# Patient Record
Sex: Female | Born: 1950 | Race: White | Hispanic: No | Smoking: Former smoker
Health system: Southern US, Community
[De-identification: ages and names within clinical notes are randomized; demographics above are authoritative.]

## PROBLEM LIST (undated history)

## (undated) DIAGNOSIS — F341 Dysthymic disorder: Secondary | ICD-10-CM

## (undated) DIAGNOSIS — K449 Diaphragmatic hernia without obstruction or gangrene: Secondary | ICD-10-CM

## (undated) DIAGNOSIS — M7750 Other enthesopathy of unspecified foot: Secondary | ICD-10-CM

## (undated) DIAGNOSIS — Z1509 Genetic susceptibility to other malignant neoplasm: Secondary | ICD-10-CM

## (undated) DIAGNOSIS — Z8041 Family history of malignant neoplasm of ovary: Secondary | ICD-10-CM

## (undated) DIAGNOSIS — Z85820 Personal history of malignant melanoma of skin: Secondary | ICD-10-CM

## (undated) DIAGNOSIS — F1120 Opioid dependence, uncomplicated: Secondary | ICD-10-CM

## (undated) DIAGNOSIS — I499 Cardiac arrhythmia, unspecified: Secondary | ICD-10-CM

## (undated) DIAGNOSIS — M216X9 Other acquired deformities of unspecified foot: Secondary | ICD-10-CM

## (undated) DIAGNOSIS — M722 Plantar fascial fibromatosis: Secondary | ICD-10-CM

## (undated) DIAGNOSIS — Z85828 Personal history of other malignant neoplasm of skin: Secondary | ICD-10-CM

## (undated) DIAGNOSIS — C4490 Unspecified malignant neoplasm of skin, unspecified: Secondary | ICD-10-CM

## (undated) DIAGNOSIS — K635 Polyp of colon: Secondary | ICD-10-CM

## (undated) DIAGNOSIS — Z801 Family history of malignant neoplasm of trachea, bronchus and lung: Secondary | ICD-10-CM

## (undated) DIAGNOSIS — F419 Anxiety disorder, unspecified: Secondary | ICD-10-CM

## (undated) DIAGNOSIS — R55 Syncope and collapse: Secondary | ICD-10-CM

## (undated) DIAGNOSIS — M1990 Unspecified osteoarthritis, unspecified site: Secondary | ICD-10-CM

## (undated) DIAGNOSIS — Z1501 Genetic susceptibility to malignant neoplasm of breast: Secondary | ICD-10-CM

## (undated) DIAGNOSIS — Z86010 Personal history of colonic polyps: Secondary | ICD-10-CM

## (undated) DIAGNOSIS — K589 Irritable bowel syndrome without diarrhea: Secondary | ICD-10-CM

## (undated) DIAGNOSIS — I4892 Unspecified atrial flutter: Secondary | ICD-10-CM

## (undated) DIAGNOSIS — H269 Unspecified cataract: Secondary | ICD-10-CM

## (undated) DIAGNOSIS — M21619 Bunion of unspecified foot: Secondary | ICD-10-CM

## (undated) DIAGNOSIS — Z800 Family history of malignant neoplasm of digestive organs: Secondary | ICD-10-CM

## (undated) DIAGNOSIS — Z923 Personal history of irradiation: Secondary | ICD-10-CM

## (undated) DIAGNOSIS — Z803 Family history of malignant neoplasm of breast: Secondary | ICD-10-CM

## (undated) DIAGNOSIS — K219 Gastro-esophageal reflux disease without esophagitis: Secondary | ICD-10-CM

## (undated) HISTORY — DX: Bunion of unspecified foot: M21.619

## (undated) HISTORY — DX: Opioid dependence, uncomplicated: F11.20

## (undated) HISTORY — PX: POLYPECTOMY: SHX149

## (undated) HISTORY — DX: Unspecified osteoarthritis, unspecified site: M19.90

## (undated) HISTORY — DX: Syncope and collapse: R55

## (undated) HISTORY — DX: Personal history of colonic polyps: Z86.010

## (undated) HISTORY — DX: Anxiety disorder, unspecified: F41.9

## (undated) HISTORY — DX: Unspecified atrial flutter: I48.92

## (undated) HISTORY — DX: Family history of malignant neoplasm of digestive organs: Z80.0

## (undated) HISTORY — DX: Plantar fascial fibromatosis: M72.2

## (undated) HISTORY — DX: Irritable bowel syndrome, unspecified: K58.9

## (undated) HISTORY — DX: Polyp of colon: K63.5

## (undated) HISTORY — DX: Other enthesopathy of unspecified foot and ankle: M77.50

## (undated) HISTORY — DX: Other acquired deformities of unspecified foot: M21.6X9

## (undated) HISTORY — PX: PLACEMENT OF BREAST IMPLANTS: SHX6334

## (undated) HISTORY — DX: Gastro-esophageal reflux disease without esophagitis: K21.9

## (undated) HISTORY — DX: Family history of malignant neoplasm of ovary: Z80.41

## (undated) HISTORY — DX: Personal history of malignant melanoma of skin: Z85.820

## (undated) HISTORY — DX: Dysthymic disorder: F34.1

## (undated) HISTORY — DX: Family history of malignant neoplasm of breast: Z80.3

## (undated) HISTORY — PX: BUNIONECTOMY: SHX129

## (undated) HISTORY — DX: Personal history of other malignant neoplasm of skin: Z85.828

## (undated) HISTORY — PX: AUGMENTATION MAMMAPLASTY: SUR837

## (undated) HISTORY — DX: Unspecified malignant neoplasm of skin, unspecified: C44.90

## (undated) HISTORY — DX: Genetic susceptibility to malignant neoplasm of breast: Z15.01

## (undated) HISTORY — PX: SHOULDER SURGERY: SHX246

## (undated) HISTORY — PX: PARTIAL HYSTERECTOMY: SHX80

## (undated) HISTORY — DX: Family history of malignant neoplasm of trachea, bronchus and lung: Z80.1

## (undated) HISTORY — DX: Genetic susceptibility to malignant neoplasm of breast: Z15.09

## (undated) HISTORY — DX: Unspecified cataract: H26.9

---

## 1998-02-21 ENCOUNTER — Other Ambulatory Visit: Admission: RE | Admit: 1998-02-21 | Discharge: 1998-02-21 | Payer: Self-pay | Admitting: Obstetrics and Gynecology

## 1999-01-28 ENCOUNTER — Emergency Department (HOSPITAL_COMMUNITY): Admission: EM | Admit: 1999-01-28 | Discharge: 1999-01-28 | Payer: Self-pay | Admitting: Emergency Medicine

## 1999-02-03 ENCOUNTER — Encounter: Admission: RE | Admit: 1999-02-03 | Discharge: 1999-02-03 | Payer: Self-pay | Admitting: Sports Medicine

## 1999-02-06 ENCOUNTER — Encounter: Admission: RE | Admit: 1999-02-06 | Discharge: 1999-02-06 | Payer: Self-pay | Admitting: Sports Medicine

## 1999-02-13 ENCOUNTER — Ambulatory Visit (HOSPITAL_COMMUNITY): Admission: RE | Admit: 1999-02-13 | Discharge: 1999-02-13 | Payer: Self-pay | Admitting: Sports Medicine

## 1999-03-15 ENCOUNTER — Other Ambulatory Visit: Admission: RE | Admit: 1999-03-15 | Discharge: 1999-03-15 | Payer: Self-pay | Admitting: Obstetrics and Gynecology

## 2000-02-06 ENCOUNTER — Ambulatory Visit (HOSPITAL_COMMUNITY): Admission: RE | Admit: 2000-02-06 | Discharge: 2000-02-06 | Payer: Self-pay | Admitting: Obstetrics and Gynecology

## 2000-02-06 ENCOUNTER — Encounter: Payer: Self-pay | Admitting: Obstetrics and Gynecology

## 2000-03-15 ENCOUNTER — Other Ambulatory Visit: Admission: RE | Admit: 2000-03-15 | Discharge: 2000-03-15 | Payer: Self-pay | Admitting: Obstetrics and Gynecology

## 2000-03-19 DIAGNOSIS — Z860100 Personal history of colon polyps, unspecified: Secondary | ICD-10-CM

## 2000-03-19 HISTORY — DX: Personal history of colon polyps, unspecified: Z86.0100

## 2000-03-19 HISTORY — DX: Personal history of colonic polyps: Z86.010

## 2000-07-23 ENCOUNTER — Encounter: Admission: RE | Admit: 2000-07-23 | Discharge: 2000-07-23 | Payer: Self-pay | Admitting: Internal Medicine

## 2000-07-23 ENCOUNTER — Encounter: Payer: Self-pay | Admitting: Internal Medicine

## 2000-11-15 ENCOUNTER — Encounter (INDEPENDENT_AMBULATORY_CARE_PROVIDER_SITE_OTHER): Payer: Self-pay | Admitting: Specialist

## 2000-11-15 ENCOUNTER — Other Ambulatory Visit: Admission: RE | Admit: 2000-11-15 | Discharge: 2000-11-15 | Payer: Self-pay | Admitting: Gastroenterology

## 2000-11-15 DIAGNOSIS — D126 Benign neoplasm of colon, unspecified: Secondary | ICD-10-CM

## 2001-01-24 ENCOUNTER — Encounter: Admission: RE | Admit: 2001-01-24 | Discharge: 2001-01-24 | Payer: Self-pay | Admitting: Sports Medicine

## 2001-03-17 ENCOUNTER — Other Ambulatory Visit: Admission: RE | Admit: 2001-03-17 | Discharge: 2001-03-17 | Payer: Self-pay | Admitting: Obstetrics and Gynecology

## 2002-03-17 ENCOUNTER — Other Ambulatory Visit: Admission: RE | Admit: 2002-03-17 | Discharge: 2002-03-17 | Payer: Self-pay | Admitting: Obstetrics and Gynecology

## 2002-10-07 ENCOUNTER — Emergency Department (HOSPITAL_COMMUNITY): Admission: EM | Admit: 2002-10-07 | Discharge: 2002-10-07 | Payer: Self-pay | Admitting: Emergency Medicine

## 2002-10-22 ENCOUNTER — Ambulatory Visit (HOSPITAL_COMMUNITY): Admission: RE | Admit: 2002-10-22 | Discharge: 2002-10-22 | Payer: Self-pay | Admitting: Gastroenterology

## 2002-10-22 ENCOUNTER — Encounter: Payer: Self-pay | Admitting: Gastroenterology

## 2002-11-13 ENCOUNTER — Ambulatory Visit (HOSPITAL_COMMUNITY): Admission: RE | Admit: 2002-11-13 | Discharge: 2002-11-13 | Payer: Self-pay | Admitting: Ophthalmology

## 2002-11-13 ENCOUNTER — Encounter: Payer: Self-pay | Admitting: Ophthalmology

## 2002-11-19 ENCOUNTER — Encounter (INDEPENDENT_AMBULATORY_CARE_PROVIDER_SITE_OTHER): Payer: Self-pay | Admitting: Gastroenterology

## 2002-12-09 ENCOUNTER — Encounter: Payer: Self-pay | Admitting: Gastroenterology

## 2002-12-09 ENCOUNTER — Ambulatory Visit (HOSPITAL_COMMUNITY): Admission: RE | Admit: 2002-12-09 | Discharge: 2002-12-09 | Payer: Self-pay | Admitting: Gastroenterology

## 2003-01-17 ENCOUNTER — Ambulatory Visit (HOSPITAL_COMMUNITY): Admission: RE | Admit: 2003-01-17 | Discharge: 2003-01-17 | Payer: Self-pay | Admitting: Gastroenterology

## 2003-02-17 ENCOUNTER — Ambulatory Visit (HOSPITAL_COMMUNITY): Admission: RE | Admit: 2003-02-17 | Discharge: 2003-02-17 | Payer: Self-pay | Admitting: Gastroenterology

## 2003-02-18 ENCOUNTER — Ambulatory Visit (HOSPITAL_COMMUNITY): Admission: RE | Admit: 2003-02-18 | Discharge: 2003-02-18 | Payer: Self-pay | Admitting: Gastroenterology

## 2003-03-18 ENCOUNTER — Other Ambulatory Visit: Admission: RE | Admit: 2003-03-18 | Discharge: 2003-03-18 | Payer: Self-pay | Admitting: Obstetrics and Gynecology

## 2003-05-27 ENCOUNTER — Encounter (INDEPENDENT_AMBULATORY_CARE_PROVIDER_SITE_OTHER): Payer: Self-pay | Admitting: Gastroenterology

## 2003-05-27 ENCOUNTER — Ambulatory Visit (HOSPITAL_COMMUNITY): Admission: RE | Admit: 2003-05-27 | Discharge: 2003-05-27 | Payer: Self-pay | Admitting: Gastroenterology

## 2003-05-27 ENCOUNTER — Encounter (INDEPENDENT_AMBULATORY_CARE_PROVIDER_SITE_OTHER): Payer: Self-pay | Admitting: Specialist

## 2004-03-17 ENCOUNTER — Other Ambulatory Visit: Admission: RE | Admit: 2004-03-17 | Discharge: 2004-03-17 | Payer: Self-pay | Admitting: Obstetrics and Gynecology

## 2004-03-19 HISTORY — PX: ARTHROPLASTY: SHX135

## 2004-07-19 ENCOUNTER — Ambulatory Visit: Payer: Self-pay | Admitting: Gastroenterology

## 2004-07-21 ENCOUNTER — Ambulatory Visit: Payer: Self-pay | Admitting: Cardiovascular Disease

## 2004-08-30 ENCOUNTER — Ambulatory Visit (HOSPITAL_BASED_OUTPATIENT_CLINIC_OR_DEPARTMENT_OTHER): Admission: RE | Admit: 2004-08-30 | Discharge: 2004-08-31 | Payer: Self-pay | Admitting: *Deleted

## 2004-08-30 ENCOUNTER — Ambulatory Visit (HOSPITAL_COMMUNITY): Admission: RE | Admit: 2004-08-30 | Discharge: 2004-08-30 | Payer: Self-pay | Admitting: *Deleted

## 2004-09-13 ENCOUNTER — Ambulatory Visit: Payer: Self-pay | Admitting: Gastroenterology

## 2005-03-07 ENCOUNTER — Other Ambulatory Visit: Admission: RE | Admit: 2005-03-07 | Discharge: 2005-03-07 | Payer: Self-pay | Admitting: Obstetrics and Gynecology

## 2005-05-09 ENCOUNTER — Ambulatory Visit: Payer: Self-pay | Admitting: Gastroenterology

## 2005-09-26 ENCOUNTER — Ambulatory Visit: Payer: Self-pay | Admitting: Gastroenterology

## 2006-01-09 ENCOUNTER — Ambulatory Visit: Payer: Self-pay | Admitting: Gastroenterology

## 2006-03-08 ENCOUNTER — Other Ambulatory Visit: Admission: RE | Admit: 2006-03-08 | Discharge: 2006-03-08 | Payer: Self-pay | Admitting: Obstetrics and Gynecology

## 2006-07-31 ENCOUNTER — Ambulatory Visit: Payer: Self-pay | Admitting: Gastroenterology

## 2007-02-04 ENCOUNTER — Other Ambulatory Visit: Admission: RE | Admit: 2007-02-04 | Discharge: 2007-02-04 | Payer: Self-pay | Admitting: Obstetrics and Gynecology

## 2007-02-25 ENCOUNTER — Ambulatory Visit: Payer: Self-pay | Admitting: Gastroenterology

## 2007-03-04 ENCOUNTER — Ambulatory Visit: Payer: Self-pay | Admitting: Gastroenterology

## 2007-03-04 LAB — CONVERTED CEMR LAB
OCCULT 1: NEGATIVE
OCCULT 2: NEGATIVE
OCCULT 4: NEGATIVE

## 2007-03-18 ENCOUNTER — Ambulatory Visit: Payer: Self-pay | Admitting: Gastroenterology

## 2007-04-15 ENCOUNTER — Ambulatory Visit: Payer: Self-pay | Admitting: Gastroenterology

## 2007-04-30 ENCOUNTER — Ambulatory Visit: Payer: Self-pay | Admitting: Sports Medicine

## 2007-04-30 DIAGNOSIS — M7660 Achilles tendinitis, unspecified leg: Secondary | ICD-10-CM | POA: Insufficient documentation

## 2007-04-30 DIAGNOSIS — M722 Plantar fascial fibromatosis: Secondary | ICD-10-CM

## 2007-04-30 DIAGNOSIS — M7750 Other enthesopathy of unspecified foot: Secondary | ICD-10-CM

## 2007-04-30 DIAGNOSIS — M216X9 Other acquired deformities of unspecified foot: Secondary | ICD-10-CM

## 2007-04-30 DIAGNOSIS — M21619 Bunion of unspecified foot: Secondary | ICD-10-CM

## 2007-05-28 ENCOUNTER — Ambulatory Visit: Payer: Self-pay | Admitting: Sports Medicine

## 2007-06-04 ENCOUNTER — Emergency Department (HOSPITAL_COMMUNITY): Admission: EM | Admit: 2007-06-04 | Discharge: 2007-06-04 | Payer: Self-pay | Admitting: Emergency Medicine

## 2007-06-06 ENCOUNTER — Ambulatory Visit: Payer: Self-pay | Admitting: Gastroenterology

## 2007-07-04 DIAGNOSIS — F341 Dysthymic disorder: Secondary | ICD-10-CM | POA: Insufficient documentation

## 2007-07-04 DIAGNOSIS — K589 Irritable bowel syndrome without diarrhea: Secondary | ICD-10-CM | POA: Insufficient documentation

## 2007-07-04 DIAGNOSIS — K219 Gastro-esophageal reflux disease without esophagitis: Secondary | ICD-10-CM

## 2007-07-08 ENCOUNTER — Ambulatory Visit: Payer: Self-pay | Admitting: Gastroenterology

## 2007-07-24 ENCOUNTER — Ambulatory Visit: Payer: Self-pay | Admitting: Gastroenterology

## 2007-07-29 ENCOUNTER — Telehealth: Payer: Self-pay | Admitting: Gastroenterology

## 2007-07-31 ENCOUNTER — Telehealth: Payer: Self-pay | Admitting: Gastroenterology

## 2007-07-31 ENCOUNTER — Ambulatory Visit (HOSPITAL_COMMUNITY): Admission: RE | Admit: 2007-07-31 | Discharge: 2007-07-31 | Payer: Self-pay | Admitting: Gastroenterology

## 2007-07-31 ENCOUNTER — Encounter: Payer: Self-pay | Admitting: Gastroenterology

## 2007-08-12 ENCOUNTER — Ambulatory Visit: Payer: Self-pay | Admitting: Gastroenterology

## 2009-11-24 ENCOUNTER — Encounter: Admission: RE | Admit: 2009-11-24 | Discharge: 2009-11-24 | Payer: Self-pay | Admitting: Obstetrics and Gynecology

## 2009-12-08 ENCOUNTER — Emergency Department (HOSPITAL_COMMUNITY): Admission: EM | Admit: 2009-12-08 | Discharge: 2009-12-08 | Payer: Self-pay | Admitting: Family Medicine

## 2010-04-27 ENCOUNTER — Other Ambulatory Visit: Payer: Self-pay | Admitting: Obstetrics and Gynecology

## 2010-04-27 DIAGNOSIS — Z1231 Encounter for screening mammogram for malignant neoplasm of breast: Secondary | ICD-10-CM

## 2010-06-05 ENCOUNTER — Ambulatory Visit
Admission: RE | Admit: 2010-06-05 | Discharge: 2010-06-05 | Disposition: A | Discharge status: Home / Self Care | Payer: Commercial Managed Care - PPO | Source: Ambulatory Visit | Attending: Obstetrics and Gynecology | Admitting: Obstetrics and Gynecology

## 2010-06-05 DIAGNOSIS — Z1231 Encounter for screening mammogram for malignant neoplasm of breast: Secondary | ICD-10-CM

## 2010-08-01 NOTE — Assessment & Plan Note (Signed)
[facility]                         GASTROENTEROLOGY OFFICE NOTE   VICI, NOVICK                        MRN:          [id number]  DATE:03/18/2007                            DOB:          [birth year]    Chiana continues with diffuse abdominal pain, gas, and bloating.  She  says she is really not having constipation.  She really denies other GI  symptoms.  Seems to be up to date on her colonoscopy.  She denies fever,  chills, or other systemic complaints.  Her vital signs were all stable  and her abdominal exam is unremarkable.   Marlow tried [employer] for about a week and it caused no significant  improvement.  I have gone ahead and decided to place her on Robinul 2 mg  twice a day and have given her a limited refill on her hydrocodone  prescription.  She is to call in a week's time for a progress report.  Otherwise, to see me again in a month.  If she has continued problems,  we may need to give consideration to refer her to Dr. Kathlene Cote at [facility] for further evaluation since she is requiring narcotic  administration.  Actually, on reviewing the chart, she has been to  [facility] in May of 2006 and saw Dr. Andi Devon.  At that time, they  suggested she see Dr. Steele Sizer to discuss pain management of her  pain cycles.     Vania Rea. Jarold Motto, MD, Caleen Essex, FAGA  Electronically Signed    DRP/MedQ  DD: 03/18/2007  DT: 03/18/2007  Job #: [id number]

## 2010-08-01 NOTE — Assessment & Plan Note (Signed)
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 Corinda Best.  She has a long  history of irritable bowel syndrome with predominant gas, bloating, and  constipation.  This all worsened approximately 4 years ago.  She is  currently using p.r.n. Vicodin for severe lower abdominal pain.  She  receives regular gynecologic checkups per Dr. Ashley Best, and apparently  has had a hysterectomy, but has no other gynecologic difficulties.  She  has been tested extensively by Dr. Corinda Best because of a family history  of colon cancer, and did have a polyp several years ago, but a negative  exam 4 years ago.  She has had an endoscopy, small bowel biopsy, a  celiac panel, and multiple lab tests and also breath tests, all of which  has been negative.  She saw Dr. Leone Best for a second opinion in January  of 2005 who suggested tricyclic antidepressants and Robinul.  She  currently is not on any antispasmodics, but does take regular Metamucil,  and long-acting 24-hour release Xanax preparation, and Cymbalta 20 mg a  day.   The patient uses a lot of sorbitol in the form of diet gum, but  otherwise follows a fairly regular diet.  She has had no anorexia,  weight loss, upper GI, hepatobiliary complaints, or associated systemic  complaints such as fever, chills, skin rashes, joint pains, Raynaud's  phenomenon, or other symptoms of collagen vascular disease.   She is a healthy-appearing white female in no distress, appearing her  stated age.  She weighs 123 pounds.  Her blood pressure is 102/60, and pulse was 66  and regular.  I could not appreciate the stigmata of chronic liver disease or  thyromegaly.  Her chest was entirely clear and she was in a regular rhythm without  murmurs, gallops, or rubs.  Her abdomen showed no organomegaly, masses, tenderness or distension.  Peripheral extremities were unremarkable.  She had very active, loud bowel sounds that were nonobstructive in  quality.   ASSESSMENT:  1. Chronic constipation-predominant irritable bowel syndrome.  2. A very long, tortuous, redundant colon anatomically per Dr.      Blossom Best colonoscopy notes.  3. Possible bacterial overgrowth with methane production.  4. Previous gastrointestinal workup, also, at [facility] because of      previous abnormal CT scan of her pancreas.  Apparently, her workup      was entirely negative.  She is felt to have mostly functional      complaints.   RECOMMENDATIONS:  1. Avoidance of sorbitol and fructose in her diet.  2. Trial of Xifaxan 400 mg t.i.d. for 10 days along with daily Align      therapy.  3. I have given her limited refill on her narcotics prescription, and  advised her about the addictive potential of this medication, plus      the worsening of her constipation.  4. Outpatient Hemoccult cards.  5. Gastrointestinal followup in several weeks' time, and consider      trial of Amitiza therapy with a discontinuation of narcotics.   ADDENDUM:  Review of the patient's chart also shows that she had a normal small  bowel series in December of 2004.  She has had a prominent __________ of  the pancreas seen on CT scans, but this is not felt to be a significant.  Gallbladder evaluation, including hepatobiliary scanning with ejection  fraction, has been normal.  She relates that she recently had multiple  blood tests at her primary care physician's office that were normal.  She also had thyroid function tests because of a history of dry skin,  hair, etc.     Caitlin Rea. Jarold Motto, MD, Caleen Essex, FAGA  Electronically Signed    DRP/MedQ  DD: 02/25/2007  DT: 02/25/2007  Job #:  [id number]   cc:   [name], M.D.

## 2010-08-01 NOTE — Assessment & Plan Note (Signed)
[facility]                         GASTROENTEROLOGY OFFICE NOTE   [name], [name]                        MRN:          [id number]  DATE:06/06/2007                            DOB:          1950/11/25    [name] continues with gas, bloating, and crampy lower abdominal pain.  She did not try Amitiza as previously recommended.  She denies rectal  bleeding, anorexia, weight loss,  or extended complaints.  She does have  a family history of colon cancer in one of her parents.  Her last  colonoscopy was by Dr. Corinda Gubler in March of 2005.  All lab data has  recently been rechecked and is normal.   PHYSICAL EXAMINATION:  She is a healthy-appearing, attractive white  female in distress.  She appears younger than her stated age.  She  weighs 127 pounds, and blood pressure is 118/72, and pulse was 68 and  regular.  She had no organomegaly, masses, or abdominal tenderness.  Bowel sounds were normal.   ASSESSMENT:  [name] has constipation-predominant irritable bowel  syndrome of a rather classic nature.  Has had extensive negative GI  evaluations.   RECOMMENDATIONS:  1. I have again urged her to try Amitiza 8 mcg twice a day with meals      and have given her samples.  2. Discontinue Metamucil and try Benefiber 1 tablespoon on cereal in      the morning and a tablespoon with juice before bed.  3. Try p.r.n. sublingual 0.25 mg hyoscyamine for abdominal pain,      spasms.  4. Continue liberal p.o. fluids and high fiber diet as tolerated.  5. GI followup in several weeks' time and will consider followup      colonoscopy exam per family history and clinical course.   The patient does have a history of recurrent colon polyps with a rather  prominent polyp removed in 2002.  I also reviewed the patient's record  and she was seen at the [facility] in December 2006 and was felt to have functional GI  complaints and at that time was placed on Cymbalta which she apparently  has continued 20 mg a day, and also has continued on slow release Xanax  preparation.  She saw Dr. Fabio Pierce at that time and also underwent  consultation with Dr. Steele Sizer for pain management strategies.     Caitlin Rea. Jarold Motto, MD, Caleen Essex, FAGA  Electronically Signed    DRP/MedQ  DD: 06/06/2007  DT: 06/06/2007  Job #: [id number]

## 2010-08-01 NOTE — Assessment & Plan Note (Signed)
[name]                         GASTROENTEROLOGY OFFICE NOTE   [name], [name]                        MRN:          [id number]  DATE:07/08/2007                            DOB:          1950-11-26    [name] continues with severe lower abdominal crampy pain.  [name] was  unable to follow any of the suggestions I made in my office notes from  June 06, 2007.  [name] continues with more constipation.  [name] has had mild  relief with hyoscyamine administration.   [name] is awake and alert in no acute distress.  Her weight is 125 pounds.  Blood pressure 108/66.  Pulse was 60 and  regular.  ABDOMEN:  Entirely benign without organomegaly, masses, tenderness, or  distention.  RECTAL:  Inspection of the rectum also was unremarkable as is rectal  exam, and there was soft stool in the rectal vault, was guaiac negative.   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  As from a previous stay, [name] has been  previously evaluated at [facility].  Her last colonoscopy exam was by  Dr. Corinda Gubler with general anesthesia in March of 2005.   RECOMMENDATIONS:  I have renewed all of her medications and we will try  Pamine Forte 5 mg twice a day on a regular basis.  I have scheduled her  for outpatient endoscopy with one of my colleagues with general  anesthesia sedation at her request.  If this is unremarkable, I will  give consideration referring her back to the [facility] Clinic at [facility] where [name] has not been seen since 2006. [name] probably will need PAIN  CLINIC referral also.per NARCOTIC dependency for pain control.     Vania Rea. Jarold Motto, MD, Caleen Essex, FAGA  Electronically Signed    DRP/MedQ  DD: 07/08/2007  DT: 07/08/2007  Job #: [id number]

## 2010-08-01 NOTE — Assessment & Plan Note (Signed)
[facility]                         GASTROENTEROLOGY OFFICE NOTE   [name], [name]                        MRN:          [id number]  DATE:04/15/2007                            DOB:          May 09, 1950    [name] continues to have intermittent lower abdominal pain which actually  is better with Robinul.  She continues to have symptoms consistent with  incomplete rectal emptying and has a long, redundant and tortuous colon.  As per my previous notes, it seemed that she got started on narcotics  several years ago because of the severe nature of her irritable bowel  syndrome and relates that she uses these very seldom.  On reviewing her  chart I can not see where she is abusing narcotics.   She recently had a severe episode of lower abdominal spasms which was  relieved rather quickly with Robinul 2 mg.  She continues with mild  constipation but is having no rectal bleeding.   VITAL SIGNS:  Normal.  General physical exam is not repeated at this time.   RECOMMENDATIONS:  1. Trial of Amitiza 8 mcg twice a day with meals.  2. Hold Robinul until we see if Amitiza has an effect.  3. Continue high fiber diet, fiber supplements and liberal p.o.      fluids.  4. GI followup as needed.     Vania Rea. Jarold Motto, MD, Caleen Essex, FAGA  Electronically Signed    DRP/MedQ  DD: 04/15/2007  DT: 04/15/2007  Job #: [id number]

## 2010-08-01 NOTE — Assessment & Plan Note (Signed)
[facility]                         GASTROENTEROLOGY OFFICE NOTE   ANUSHRI, CASALINO                        MRN:          [id number]  DATE:07/31/2006                            DOB:          17-Feb-1951    Gracia comes in says she is doing fairly well except for having a lot of  gas, and some hoarseness, and some epigastric pain. Jaynie has had some  brown sputum in the morning some times but it really sounds more like  sinus drainage than GERD. She has had no indigestion. Her medications  include; Metamucil, multivitamins, Xanax, Estradiol, Cymbalta, and  magnesium.   PHYSICAL EXAMINATION:  She weighed 130, blood pressure 100/72, pulse 60  and regular. She actually looks great for her age. Oropharynx  was  negative.  NECK: Negative.  CHEST: Clear.  HEART: Revealed a regular rhythm.  ABDOMEN: Soft, no masses, or organomegaly, nontender.  EXTREMITIES: Unremarkable.  RECTAL: Deferred.   IMPRESSION:  Irritable bowel syndrome.  I think with significant  __________ hypersensitivity. This is part of the syndrome and causes her  so much difficultly with gas and bloating.  1. History  of gastroesophageal reflux disease.  2. Underlying anxiety, depression.  3. Possible sinus drainage.   RECOMMENDATIONS:  She is to try __________ again. I gave her some  samples to take 1 b.i.d. and we are trying to get her some free pills.  She could not afford the rest and I gave her some  __________ probiotic.  I told her to be on dietary restrictions for anything that seems to  reproduce her gas symptoms, and I also talked to some of the research  girls about her getting on 1 of our research studies for irritable bowel  syndrome because I think she suffers with to such a degree, she does  think the stress has some relationship. I told her to follow up with one  of my colleagues upon my retirement as needed.     Ulyess Mort, MD  Electronically Signed    SML/MedQ  DD: 07/31/2006  DT: 07/31/2006  Job #: [id number]

## 2010-08-04 NOTE — Assessment & Plan Note (Signed)
[facility]                           GASTROENTEROLOGY OFFICE NOTE   NATINA, WIGINTON                        MRN:          [id number]  DATE:01/09/2006                            DOB:          Feb 17, 1951    This is a patient I have known well. She says she is having some difficulty  passing gas and she has had some abdominal pain that keeps her up at night.  Wonders whether it is gas pain. It has gotten worse lately. There is some  pain in the right lower quadrant. If she moves, sometimes it is in a  different position, it is not as bad. I told her that we had done a  colonoscopic examination on her in 2005, which was totally normal, which  would make me wonder most of this was functional and related to her  constipation causing her symptoms. I told her about using Senokot with senna  and prunes and MiraLax. She says she does not like MiraLax. She used it  before and wondered about trying her on some Xifaxan 200 mg b.i.d. for ten  days. I told her she could use some simethicone. She needs to use Dulcolax  if it is needed. If she was not having fairly regular bowel activity use her  Dulcolax tablets. I told her to try this and to see how she responds. She  has known IBS with constipation component, GERD and she has underlying  anxiety/depression over the years. Hopefully, the above-mentioned will be  helpful to her. She is to call if she continues to have further difficulty.            ______________________________  Ulyess Mort, MD      SML/MedQ  DD:  01/09/2006  DT:  01/10/2006  Job #:  [id number]

## 2010-08-04 NOTE — Op Note (Signed)
NAMELAIBA, FUERTE NO.:  [id number]   MEDICAL RECORD NO.:  [id number]          PATIENT TYPE:  AMB   LOCATION:  DSC                          FACILITY:  [facility]   PHYSICIAN:  Tennis Must Meyerdierks, M.D.DATE OF BIRTH:  [birth year]   DATE OF PROCEDURE:  08/30/2004  DATE OF DISCHARGE:                                 OPERATIVE REPORT   PREOPERATIVE DIAGNOSIS:  Carpometacarpal arthritis, left thumb.   POSTOPERATIVE DIAGNOSIS:  Carpometacarpal arthritis, left thumb.   PROCEDURE:  Excision of trapezium with insertion of half of the flexor carpi  radialis tendon arthroplasty.   SURGEON:  Dr. Metro Kung.   ANESTHESIA:  General.   OPERATIVE FINDINGS:  The patient had severe necrosis of the capsule  surrounding the [facility] joint. It appeared as though the steroid injections had  caused this. The FCR attachment was completely frayed and was destroyed.   PROCEDURE:  Under general anesthesia with a tourniquet on the left arm, the  left hand was prepped and draped in the usual fashion after exsanguinating  the limb.  The tourniquet was inflated to 250 mmHg. A gently curved incision  was made over the dorsal radial aspect of the base of the first metacarpal.  Sharp dissection was carried down through the subcutaneous tissues. Bleeding  points were coagulated. The interval between the extensor pollicis brevis  and the abductor pollicis longus tendon was identified and sharply incised.  The capsule of the [facility] joint was incised down to bone. There was significant  necrosis of the edge of the capsule with steroid material. This was  debrided. The trapezium was then outlined and was dissected out sharply and  an osteotome was used to divide the trapezium into four pieces. The rongeur  was used to remove the trapezium in a piecemeal fashion. There were severe  arthritic changes on both the base of the metacarpal and the distal  scaphoid.  . After completely excising the trapezium,  it was noted that the  flexor carpi radialis tendon had been detached from the irritation of the  capsule. At this point, it was determined that an anchovy arthroplasty would  be performed without a tendon transfer. Transverse incisions were made over  the FCR tendon in the volar forearm and sharp dissection carried down to the  tendon. Half of the tendon was then removed and brought out through the  dorsal wound. The tendon was then rolled into an anchovy and 4-0 Vicryl  suture was used to hold the anchovy together. The anchovy was then placed  into the defect of the trapezium and secured to the capsular tissue in the  deep portion with a 4-0 Vicryl suture. This allowed for good cushioning  between the first metacarpal and the scaphoid.  The remaining capsular  tissue was then closed with a 4-0 Vicryl and the skin with the 3-0  subcuticular Prolene. Steri-Strips were applied and 0.5% Marcaine was  inserted for pain control. The volar wounds were closed with a combination  of 4-0 Prolene and 4-0 nylon. Sterile dressings were then applied followed  by a thumb spica  splint. The patient went to the recovery room, awake and  stable and in good condition.       EMM/MEDQ  D:  08/30/2004  T:  08/30/2004  Job:  [id number]

## 2010-08-04 NOTE — Assessment & Plan Note (Signed)
[facility]                           GASTROENTEROLOGY OFFICE NOTE   [name], [name]                        MRN:          [id number]  DATE:09/26/2005                            DOB:          01-25-1951    [name] comes in very concerned about her bowel activity and habits and her  lower abdominal pain.  We had a long discussion about causes of her symptoms  and I told her it was because of the tortuosity of her colon that we had  talked about in the past.  I thought that she had irritable bowel with a  constipation component.  She told me that she was having lots of gas and I  told her we could try the Xifaxan again.  She thought it was very helpful  and I told her that I would give her a prescription for such.  I gave her a  prescription for hydrocodone 5/500 to take as directed p.r.n. for severe  pain, I gave her 30 with no refills.   PHYSICAL EXAMINATION:  GENERAL:  Today was basically unremarkable.  She  looks quite good.  VITAL SIGNS:  She weighed 132, blood pressure 180/60, pulse 60 and regular.  NECK, HEART, EXTREMITIES:  Unremarkable.  ABDOMEN:  Specifically, was soft, no masses or organomegaly and nontender.   IMPRESSION:  1.  Irritable bowel syndrome.  2.  Anxiety and depression.  3.  History of gastroesophageal reflux disease.   RECOMMENDATIONS:  As above.  I did give her a prescription for Xifaxan to  add to her regimen, which __________, and told her to let us know if she was  not improving.  I do think there is some underlying anxiety and stress here  because she indicated that she was having problems with her daughter and so  I think that is probably contributing to her symptoms.                                   Ulyess Mort, MD   SML/MedQ  DD:  09/26/2005  DT:  09/27/2005  Job #:  [id number]

## 2010-09-11 ENCOUNTER — Encounter: Payer: Self-pay | Admitting: Gastroenterology

## 2010-10-17 ENCOUNTER — Ambulatory Visit: Payer: Commercial Managed Care - PPO | Admitting: Gastroenterology

## 2010-11-03 ENCOUNTER — Ambulatory Visit (INDEPENDENT_AMBULATORY_CARE_PROVIDER_SITE_OTHER): Payer: Commercial Managed Care - PPO | Admitting: Gastroenterology

## 2010-11-03 ENCOUNTER — Encounter: Payer: Self-pay | Admitting: Gastroenterology

## 2010-11-03 ENCOUNTER — Other Ambulatory Visit: Payer: Self-pay | Admitting: Gastroenterology

## 2010-11-03 ENCOUNTER — Other Ambulatory Visit (INDEPENDENT_AMBULATORY_CARE_PROVIDER_SITE_OTHER): Payer: Commercial Managed Care - PPO

## 2010-11-03 DIAGNOSIS — R079 Chest pain, unspecified: Secondary | ICD-10-CM

## 2010-11-03 DIAGNOSIS — Z800 Family history of malignant neoplasm of digestive organs: Secondary | ICD-10-CM | POA: Insufficient documentation

## 2010-11-03 DIAGNOSIS — F411 Generalized anxiety disorder: Secondary | ICD-10-CM

## 2010-11-03 DIAGNOSIS — F988 Other specified behavioral and emotional disorders with onset usually occurring in childhood and adolescence: Secondary | ICD-10-CM

## 2010-11-03 DIAGNOSIS — K589 Irritable bowel syndrome without diarrhea: Secondary | ICD-10-CM

## 2010-11-03 DIAGNOSIS — K219 Gastro-esophageal reflux disease without esophagitis: Secondary | ICD-10-CM

## 2010-11-03 DIAGNOSIS — F419 Anxiety disorder, unspecified: Secondary | ICD-10-CM | POA: Insufficient documentation

## 2010-11-03 LAB — CBC WITH DIFFERENTIAL/PLATELET
Basophils Relative: 0.5 % (ref 0.0–3.0)
Eosinophils Absolute: 0 10*3/uL (ref 0.0–0.7)
HCT: 41.7 % (ref 36.0–46.0)
Hemoglobin: 14 g/dL (ref 12.0–15.0)
Lymphs Abs: 1.3 10*3/uL (ref 0.7–4.0)
MCHC: 33.6 g/dL (ref 30.0–36.0)
MCV: 95.2 fl (ref 78.0–100.0)
Monocytes Absolute: 0.4 10*3/uL (ref 0.1–1.0)
Neutro Abs: 3 10*3/uL (ref 1.4–7.7)
RBC: 4.38 Mil/uL (ref 3.87–5.11)

## 2010-11-03 LAB — HEPATIC FUNCTION PANEL
Alkaline Phosphatase: 47 U/L (ref 39–117)
Bilirubin, Direct: 0.1 mg/dL (ref 0.0–0.3)
Total Bilirubin: 0.5 mg/dL (ref 0.3–1.2)

## 2010-11-03 LAB — BASIC METABOLIC PANEL
Calcium: 9.3 mg/dL (ref 8.4–10.5)
GFR: 75.02 mL/min (ref >60.00)
Potassium: 4.6 mEq/L (ref 3.5–5.1)
Sodium: 139 mEq/L (ref 135–145)

## 2010-11-03 MED ORDER — ESOMEPRAZOLE MAGNESIUM 40 MG PO CPDR: 40.0000 mg | DELAYED_RELEASE_CAPSULE | Freq: Every day | ORAL | Status: DC

## 2010-11-03 NOTE — Progress Notes (Signed)
History of Present Illness:  This is a very pleasant 60 year old Caucasian female with chronic IBS, constipation predominant. She had a negative colonoscopy by Dr. Christella Hartigan in May of 2009. Does have a positive family history of colon cancer in her father is 71s. Her history also is remarkable for ovarian and breast cancer. She is followed closely by Dr. Tenny Craw in gynecology and Dr. Kevan Ny in primary care. She continues to have constipation but no rectal bleeding. Her main complaint today is nocturnal regurgitation and burning substernal chest pain without dysphagia or hepatobiliary complaints. I cannot see where she's had previous endoscopy, but her records were over 49 years old. She does not smoke, use alcohol, and does not have obesity. She denies systemic complaints such as fever, chills, anorexia, weight loss, nausea or vomiting coronary history of hepatitis or pancreatitis.  She has rather typical reflux symptoms and occasional have relief with burping. He has had previous hysterectomy. She may have Raynaud's phenomenon in her hands when she runs. Surgery history is positive for supraventricular tachycardia alleviated by vagal stimulation.   I have reviewed this patient's present history, medical and surgical past history, allergies and medications.     ROS: The remainder of the 10 point ROS is negative... no other symptoms of collagen vascular disease. She does have periodic episodes of rapid heartbeat diagnosed as SVT. She is now on Inderal or beta blockers. Currently she denies any cardiovascular complaints otherwise her pulmonary, genitourinary or neurological symptoms. Her constipation is most consistent with incomplete rectal emptying. She does have a history of ADD and is on Adderall 30 mg twice a day, and also when necessary Xanax for anxiety and Cymbalta 20 mg a day for chronic depression.  Past Medical History  Diagnosis Date  . Dysthymic disorder   . Irritable bowel syndrome   . Esophageal  reflux   . Personal history of colonic polyps 2002    hyperplastic  . Rotator cuff syndrome of shoulder and allied disorders   . Plantar fascial fibromatosis   . Other acquired calcaneus deformity   . Enthesopathy of ankle and tarsus, unspecified   . Bunion   . Family history of malignant neoplasm of gastrointestinal tract   . Narcotic dependence   . Anxiety   . Skin cancer   . Colon polyps    Past Surgical History  Procedure Date  . Bunionectomy   . Arthroplasty 2006    thumb  . Partial hysterectomy     Still has ovaries    reports that she has quit smoking. She does not have any smokeless tobacco history on file. She reports that she does not drink alcohol or use illicit drugs. family history includes Breast cancer in an unspecified family member; Colon cancer (age of onset:50) in her father; and Ovarian cancer in an unspecified family member. No Known Allergies      Physical Exam: General well developed well nourished patient in no acute distress, appearing her stated age Eyes PERRLA, no icterus, fundoscopic exam per opthamologist Skin no lesions noted Neck supple, no adenopathy, no thyroid enlargement, no tenderness Chest clear to percussion and auscultation Heart no significant murmurs, gallops or rubs noted Abdomen no hepatosplenomegaly masses or tenderness, BS normal.  Extremities no acute joint lesions, edema, phlebitis or evidence of cellulitis. Neurologic patient oriented x 3, cranial nerves intact, no focal neurologic deficits noted. Psychological mental status normal and normal affect.  Assessment and plan: Acid reflux symptomatology all in all for several years, worse over the last  several months with awakening from sleep at night with chest pain. She currently is on over-the-counter Zegerid with minimal response. I have reviewed reflux regime with her, I have prescribed Dexilant 60 mg before the evening meal, and we'll schedule endoscopic exam. Labs also ordered  including celiac panel, and stool IFOB cards. She is due for colonoscopy followup in 2 years time. As mentioned above she may have Raynaud's phenomenon which is associated with LES incompetency and low amplitude esophageal contractions. Most of these patients suffer from chronic reflux and may require unusually large doses of PPI medications. We will try Amitiza 8 mcg twice a day.  Please copy her primary care physician, referring physician, and pertinent subspecialists.  Encounter Diagnosis  Name Primary?  . Chest pain Yes

## 2010-11-03 NOTE — Patient Instructions (Addendum)
Your procedure has been scheduled for 11/06/2010, please follow the seperate instructions.  Please go to the basement today for your labs.  Take the Amitiza samples twice a day with meals. Stop Zegerid and start Nexium   Diet for GERD or PUD Nutrition therapy can help ease the discomfort of gastroesophageal reflux disease (GERD) and peptic ulcer disease (PUD).  HOME CARE INSTRUCTIONS  Eat your meals slowly, in a relaxed setting.   Eat 5 to 6 small meals per day.   If a food causes distress, stop eating it for a period of time.  FOODS TO AVOID:  Coffee, regular or decaffeinated.  Cola beverages, regular or low calorie.   Tea, regular or decaffeinated.   Pepper.   Cocoa.   High fat foods including meats.   Butter, margarine, hydrogenated oil (trans fats).  Peppermint or spearmint (if you have GERD).   Fruits and vegetables as tolerated.   Alcoholic beverages.   Nicotine (smoking or chewing). This is one of the most potent stimulants to acid production in the gastrointestinal tract.   Any food that seems to aggravate your condition.   If you have questions regarding your diet, call your caregiver's office or a registered dietitian. OTHER TIPS IF YOU HAVE GERD:  Lying flat may make symptoms worse. Keep the head of your bed raised 6 to 9 inches by using a foam wedge or blocks under the legs of the bed.   Do not lay down until 3 hours after eating a meal.   Daily physical activity may help reduce symptoms.  MAKE SURE YOU:   Understand these instructions.   Will watch your condition.   Will get help right away if you are not doing well or get worse.  Document Released: 03/05/2005 Document Re-Released: 07/22/2008 [facility] Patient Information 2011 [place], [place].

## 2010-11-06 ENCOUNTER — Encounter: Payer: Self-pay | Admitting: Gastroenterology

## 2010-11-06 ENCOUNTER — Ambulatory Visit (AMBULATORY_SURGERY_CENTER): Payer: Commercial Managed Care - PPO | Admitting: Gastroenterology

## 2010-11-06 ENCOUNTER — Other Ambulatory Visit: Payer: Self-pay | Admitting: Gastroenterology

## 2010-11-06 VITALS — BP 145/60 | HR 72 | Temp 97.4°F | Resp 18 | Ht 65.0 in | Wt 119.0 lb

## 2010-11-06 DIAGNOSIS — D1330 Benign neoplasm of unspecified part of small intestine: Secondary | ICD-10-CM

## 2010-11-06 DIAGNOSIS — K219 Gastro-esophageal reflux disease without esophagitis: Secondary | ICD-10-CM

## 2010-11-06 DIAGNOSIS — R079 Chest pain, unspecified: Secondary | ICD-10-CM

## 2010-11-06 DIAGNOSIS — D130 Benign neoplasm of esophagus: Secondary | ICD-10-CM

## 2010-11-06 LAB — CELIAC PANEL 10
Gliadin IgA: 4.8 U/mL (ref <20)
IgA: 200 mg/dL (ref 69–380)
Tissue Transglut Ab: 24.4 U/mL — ABNORMAL HIGH (ref <20)

## 2010-11-06 MED ORDER — SODIUM CHLORIDE 0.9 % IV SOLN: 500.0000 mL | INTRAVENOUS | Status: DC

## 2010-11-06 MED ORDER — METOCLOPRAMIDE HCL 10 MG PO TABS
10.0000 mg | ORAL_TABLET | Freq: Every day | ORAL | Status: DC
Start: 2010-11-06 — End: 2013-05-22

## 2010-11-06 NOTE — Patient Instructions (Addendum)
Continue your Medications.  Take Nexuim 30 minutes prior to evening meal.  Add Pepcid  AC and Reglan taking prior to bedtime.  Await pathology results.

## 2010-11-06 NOTE — Progress Notes (Signed)
Patient and caregiver verbalized understanding of new prescription for Reglan and to start Pepcid AC. Take these two medications together at HS. Patient verbalizes understanding to continue Nexium 30 minutes prior to evening meal.

## 2010-11-07 ENCOUNTER — Telehealth: Payer: Self-pay | Admitting: *Deleted

## 2010-11-07 DIAGNOSIS — K219 Gastro-esophageal reflux disease without esophagitis: Secondary | ICD-10-CM

## 2010-11-07 LAB — HELICOBACTER PYLORI SCREEN-BIOPSY: UREASE: NEGATIVE

## 2010-11-07 NOTE — Telephone Encounter (Signed)
Follow up Call- Patient questions:  Do you have a fever, pain , or abdominal swelling? no Pain Score  0 *  Have you tolerated food without any problems? yes  Have you been able to return to your normal activities? yes  Do you have any questions about your discharge instructions: Diet   no Medications  no Follow up visit  no  Do you have questions or concerns about your Care? no  Actions: * If pain score is 4 or above: No action needed, pain <4. " im doing great, i'm almost at work" per pt. EWM

## 2010-11-08 ENCOUNTER — Other Ambulatory Visit: Payer: Self-pay | Admitting: Gastroenterology

## 2010-11-08 ENCOUNTER — Other Ambulatory Visit: Payer: Commercial Managed Care - PPO

## 2010-11-08 DIAGNOSIS — Z1211 Encounter for screening for malignant neoplasm of colon: Secondary | ICD-10-CM

## 2010-11-10 ENCOUNTER — Other Ambulatory Visit: Payer: Self-pay | Admitting: Gastroenterology

## 2010-11-10 ENCOUNTER — Telehealth: Payer: Self-pay

## 2010-11-10 ENCOUNTER — Encounter: Payer: Self-pay | Admitting: Gastroenterology

## 2010-11-10 NOTE — Telephone Encounter (Signed)
Pt scheduled for ultrasound of the abdomen(gallbladder) at [facility] 11/13/10 arrival time 7:15am, procedure at 7:30am. Pt to be NPO after midnight. Pt aware of appt date and time.

## 2010-11-13 ENCOUNTER — Encounter: Payer: Self-pay | Admitting: *Deleted

## 2010-11-13 ENCOUNTER — Telehealth: Payer: Self-pay | Admitting: Gastroenterology

## 2010-11-13 ENCOUNTER — Ambulatory Visit (HOSPITAL_COMMUNITY)
Admission: RE | Admit: 2010-11-13 | Discharge: 2010-11-13 | Disposition: A | Discharge status: Home / Self Care | Payer: Commercial Managed Care - PPO | Source: Ambulatory Visit | Attending: Gastroenterology | Admitting: Gastroenterology

## 2010-11-13 DIAGNOSIS — R109 Unspecified abdominal pain: Secondary | ICD-10-CM | POA: Insufficient documentation

## 2010-11-13 MED ORDER — DEXLANSOPRAZOLE 60 MG PO CPDR: 60.0000 mg | DELAYED_RELEASE_CAPSULE | Freq: Every day | ORAL | Status: DC

## 2010-11-13 NOTE — Telephone Encounter (Signed)
Pt reports she still has pain at night; woke up every 30-45 min. Last pm. Pt's insurance would not pay for Dexilant so Nexium was ordered. Ok to try her with Dexilant samples and should I go ahead and order the Esophageal Mano. And 24hr PH probe? Thanks.

## 2010-11-13 NOTE — Telephone Encounter (Signed)
Opened in error

## 2010-11-13 NOTE — Telephone Encounter (Addendum)
Left samples of Dexilant at the front desk for pt to try. Scheduled her for ESO. MANO. And 24HR PH PROBE for 11/20/10 at 0900am, be there at 0830. Instructions left with Dexilant samples.

## 2010-11-13 NOTE — Telephone Encounter (Signed)
YES

## 2010-11-14 ENCOUNTER — Encounter: Payer: Self-pay | Admitting: Gastroenterology

## 2010-11-14 NOTE — Progress Notes (Signed)
  Subjective:    Patient ID: [name], female    DOB: September 07, 1950, 60 y.o.   MRN: [id number]  HPI    Review of Systems     Objective:   Physical Exam        Assessment & Plan:

## 2010-11-14 NOTE — Progress Notes (Signed)
mano and ph was scheduled on a holiday so endo had to reschedule for 11/27/2010 11:30am. Pt aware

## 2010-11-15 ENCOUNTER — Telehealth: Payer: Self-pay | Admitting: *Deleted

## 2010-11-15 ENCOUNTER — Other Ambulatory Visit: Payer: Self-pay | Admitting: Gastroenterology

## 2010-11-15 MED ORDER — DEXLANSOPRAZOLE 60 MG PO CPDR: 60.0000 mg | DELAYED_RELEASE_CAPSULE | Freq: Every day | ORAL | Status: DC

## 2010-11-15 NOTE — Telephone Encounter (Signed)
Spoke with Dr Jarold Motto to inform him the Dexilant did work. He suggested pt go ahead with the E.M. and PH Study. The Dexialnt script did go thru- $45 co pay- but I put a discount card in the mail to her for a $20 co pay. Pt stated understanding.

## 2010-11-15 NOTE — Telephone Encounter (Signed)
CHANGE TO ZANTAC 300MG  BID AND PRN LIBRAX.Marland KitchenSTOP PPI RX.Marland KitchenMarland Kitchen

## 2010-11-15 NOTE — Telephone Encounter (Signed)
Pt stated she had another night w/o PAIN! This was the 2nd day of Dexilant. Her f/u was r/s until after the ES. Mano. And 24 hr PH study. Question Dr Jarold Motto: if the Dexilant is controlling her pain, can it be used long term- in other words, does she need the surgical referral and fundoplication?

## 2010-11-16 ENCOUNTER — Ambulatory Visit: Payer: Commercial Managed Care - PPO | Admitting: Gastroenterology

## 2010-11-17 ENCOUNTER — Ambulatory Visit: Payer: Commercial Managed Care - PPO | Admitting: Gastroenterology

## 2010-11-28 ENCOUNTER — Telehealth: Payer: Self-pay | Admitting: *Deleted

## 2010-11-28 ENCOUNTER — Encounter (INDEPENDENT_AMBULATORY_CARE_PROVIDER_SITE_OTHER): Payer: Commercial Managed Care - PPO | Admitting: Gastroenterology

## 2010-11-28 ENCOUNTER — Ambulatory Visit (HOSPITAL_COMMUNITY)
Admission: RE | Admit: 2010-11-28 | Discharge: 2010-11-28 | Disposition: A | Discharge status: Home / Self Care | Payer: Commercial Managed Care - PPO | Source: Ambulatory Visit | Attending: Gastroenterology | Admitting: Gastroenterology

## 2010-11-28 DIAGNOSIS — K219 Gastro-esophageal reflux disease without esophagitis: Secondary | ICD-10-CM | POA: Insufficient documentation

## 2010-11-28 DIAGNOSIS — R079 Chest pain, unspecified: Secondary | ICD-10-CM | POA: Insufficient documentation

## 2010-11-28 NOTE — Telephone Encounter (Signed)
[name] at [facility] ENDO reports pt did not do well with the Esophageal Manometry d/t her H. Hernia; does Dr Eloise Harman want to proceed with the 24 hour PH Probe? Notified Judeth Cornfield that Dr Jarold Motto stated it's ok to cancel the [facility] Probe; reminded pt of f/u with [place] on 12/14/10 at 0945am.

## 2010-12-04 NOTE — Progress Notes (Signed)
Esophageal Manometry:  #1 upper esophageal sphincter-normal coordination between pharyngeal contraction and cricopharyngeal relaxation  #2 lower esophageal sphincter-there is normal relaxation of swallowing with a mean pressure of 45 mm of mercury.  #3 motility pattern-the patient has 45% retrograde contractions with a mean amplitude of contraction low at 51 mm of mercury. There is no evidence of esophageal spasm.  Assessment: This tracing is consistent with a non-specific esophageal motility disorder complicating her chronic GERD. She may require twice a day PPI therapy chronically. She is not a candidate for fundoplication therapy.

## 2010-12-07 ENCOUNTER — Encounter: Payer: Self-pay | Admitting: Gastroenterology

## 2010-12-12 ENCOUNTER — Telehealth: Payer: Self-pay | Admitting: *Deleted

## 2010-12-12 MED ORDER — GI COCKTAIL ~~LOC~~: 30.0000 mL | Freq: Three times a day (TID) | ORAL | Status: DC | PRN

## 2010-12-12 NOTE — Telephone Encounter (Signed)
Pt called to report the pain she thought was gone with Dexilant must have been coincidental. She reports waking up at night again d/t pain. She has an appt on Thursday, but called for something earlier. Dr Jarold Motto, per your documentation note on esophageal manometry: Assessment: This tracing is consistent with a non-specific esophageal motility disorder complicating her chronic GERD. She may require twice a day PPI therapy chronically. She is not a candidate for fundoplication therapy.  Any advice other than keeping her Thursday appt? Thanks.

## 2010-12-12 NOTE — Telephone Encounter (Signed)
Notified pt to increase her Dexilant to bid and ordered GI cocktail for her; we will see her this Thursday. Pt stated understanding.

## 2010-12-12 NOTE — Telephone Encounter (Signed)
Bid ppi and prn GI cocktail

## 2010-12-14 ENCOUNTER — Ambulatory Visit (INDEPENDENT_AMBULATORY_CARE_PROVIDER_SITE_OTHER): Payer: Commercial Managed Care - PPO | Admitting: Gastroenterology

## 2010-12-14 ENCOUNTER — Encounter: Payer: Self-pay | Admitting: Gastroenterology

## 2010-12-14 VITALS — BP 112/74 | HR 64 | Ht 65.0 in | Wt 122.2 lb

## 2010-12-14 DIAGNOSIS — F418 Other specified anxiety disorders: Secondary | ICD-10-CM | POA: Insufficient documentation

## 2010-12-14 DIAGNOSIS — R0789 Other chest pain: Secondary | ICD-10-CM | POA: Insufficient documentation

## 2010-12-14 DIAGNOSIS — F341 Dysthymic disorder: Secondary | ICD-10-CM

## 2010-12-14 DIAGNOSIS — Z8719 Personal history of other diseases of the digestive system: Secondary | ICD-10-CM | POA: Insufficient documentation

## 2010-12-14 DIAGNOSIS — K224 Dyskinesia of esophagus: Secondary | ICD-10-CM | POA: Insufficient documentation

## 2010-12-14 DIAGNOSIS — K219 Gastro-esophageal reflux disease without esophagitis: Secondary | ICD-10-CM

## 2010-12-14 MED ORDER — METOCLOPRAMIDE HCL 10 MG PO TABS: 10.0000 mg | ORAL_TABLET | Freq: Every day | ORAL | Status: DC

## 2010-12-14 MED ORDER — OXYCODONE-ACETAMINOPHEN 5-325 MG PO TABS: 1.0000 | ORAL_TABLET | Freq: Four times a day (QID) | ORAL | Status: AC | PRN

## 2010-12-14 NOTE — Patient Instructions (Addendum)
Continue your Dexilant one tablet by mouth twice a day.  Take Reglan 10mg  one tablet by mouth at bedtime, rx has been sent to your pharmacy.  We have given you a Percocet rx to take as needed for pain.  Make an appt to come back and see Dr Jarold Motto in 2 weeks.

## 2010-12-14 NOTE — Progress Notes (Signed)
This is a 60 year old Caucasian female with atypical chest pain currently described as a sharp sensation in the substernal area made better by belching and burping, unresponsive to twice a day Dexilant. She denies dysphagia or painful swallowing. Esophageal manometry showed normal lower esophageal sphincter pressure with a nonspecific esophageal motility disorder with 50% of waves aperistaltic. She also has a long history of anxiety and depression is all Cymbalta and milligrams a day and when necessary Xanax. He said multiple negative GI valuation multiple GI physicians. Also she has a long history of IBS with crampy lower abdominal pain and recurrent colonic polyposis. She has previously been evaluated at [facility] GI motility clinic. Attempted 24-hour pH probe testing were unsuccessful because of patient discomfort.  Current Medications, Allergies, Past Medical History, Past Surgical History, Family History and Social History were reviewed in [employer] record.  Pertinent Review of Systems Negative... cardiovascular pulmonary complaints or symptoms of collagen vascular disease.   Physical Exam: Cannot appreciate stigmata of chronic liver disease. Chest is clear to percussion and auscultation. She is in regular rhythm without murmurs gallops or rubs. Abdominal exam is unremarkable and peripheral extremities are normal. Mental status is normal.    Assessment and Plan: Atypical chest pain, etiology unclear, but the patient does have a nonspecific esophageal motility disorder and probable chronic GERD. I have added Reglan 10 mg at bedtime to her twice a day PPI therapy with when necessary GI cocktail and when necessary Percocet every 6-8 hours. If this is unsuccessful, we will proceed with BRAVO scopic placed pH monitor for better assessment of possible acid reflux. She has had previous negative cardiac evaluation by Dr. Verdis Prime Encounter Diagnoses  Name Primary?  . Esophageal  reflux Yes  . Chest pain, atypical

## 2010-12-29 ENCOUNTER — Telehealth: Payer: Self-pay | Admitting: Gastroenterology

## 2010-12-29 ENCOUNTER — Ambulatory Visit (INDEPENDENT_AMBULATORY_CARE_PROVIDER_SITE_OTHER): Payer: Commercial Managed Care - PPO | Admitting: Gastroenterology

## 2010-12-29 ENCOUNTER — Encounter: Payer: Self-pay | Admitting: Gastroenterology

## 2010-12-29 VITALS — BP 126/82 | HR 89 | Ht 65.0 in | Wt 122.0 lb

## 2010-12-29 DIAGNOSIS — R0789 Other chest pain: Secondary | ICD-10-CM

## 2010-12-29 DIAGNOSIS — F411 Generalized anxiety disorder: Secondary | ICD-10-CM

## 2010-12-29 DIAGNOSIS — K219 Gastro-esophageal reflux disease without esophagitis: Secondary | ICD-10-CM

## 2010-12-29 DIAGNOSIS — K224 Dyskinesia of esophagus: Secondary | ICD-10-CM | POA: Insufficient documentation

## 2010-12-29 DIAGNOSIS — K589 Irritable bowel syndrome without diarrhea: Secondary | ICD-10-CM

## 2010-12-29 DIAGNOSIS — F419 Anxiety disorder, unspecified: Secondary | ICD-10-CM

## 2010-12-29 MED ORDER — AMBULATORY NON FORMULARY MEDICATION: 1.0000 | Freq: Four times a day (QID) | Status: DC

## 2010-12-29 MED ORDER — OMEPRAZOLE-SODIUM BICARBONATE 40-1100 MG PO CAPS: 1.0000 | ORAL_CAPSULE | Freq: Every day | ORAL | Status: AC

## 2010-12-29 MED ORDER — OXYCODONE-ACETAMINOPHEN 5-325 MG PO TABS: 1.0000 | ORAL_TABLET | Freq: Four times a day (QID) | ORAL | Status: DC | PRN

## 2010-12-29 NOTE — Progress Notes (Signed)
History of Present Illness: This is a stream and denies 60 year old Caucasian female with chronic atypical chest pain. She has a known esophageal motility disorder and chronic GERD. Currently, she is on over-the-counter Zegerid 20 mg, and takes Reglan 10 mg at bedtime. She continues with episodes of belching, burping, and substernal pain of an atypical nature. There is no history of Raynaud's phenomena. With the initiation of Reglan, she has had marked improvement in her constipation and IBS complaints. As an alternative takes when necessary Xanax and hydrocodone.    Current Medications, Allergies, Past Medical History, Past Surgical History, Family History and Social History were reviewed in [employer] record.   Assessment and plan: Chronic GERD with incomplete esophageal emptying related to manometric documented nonspecific esophageal motility disorder. We will describe Zegerid 40 mg twice a day and change her prokinetic to domperidone 10 mg before meals and at bedtime as a therapeutic trial. We have reviewed neurologic side effects from metoclopramide, and have decided together with domperidone. I renewed all other medications as listed. The patient as before may need Bravo esophageal pH probe for better documentation of her pain. She also has a supersensitive esophagus in terms of pain perception. He is up-to-date or endoscopy and colonoscopy exams.

## 2010-12-29 NOTE — Patient Instructions (Signed)
Your prescriptions for Zegerid and Percocet have been sent to your pharmacy. Domperidone has been sent to your pharmacy.

## 2010-12-29 NOTE — Telephone Encounter (Signed)
Spoke with Clydie Braun at [facility] About Domperidone order.Faxed order to [phone].

## 2011-01-01 ENCOUNTER — Telehealth: Payer: Self-pay | Admitting: *Deleted

## 2011-01-01 MED ORDER — AMBULATORY NON FORMULARY MEDICATION: Status: DC

## 2011-01-01 NOTE — Telephone Encounter (Signed)
Pharmacist from [facility]. In [place], [place] called to report there is no dose on script for Domperidone.

## 2011-01-01 NOTE — Telephone Encounter (Signed)
Resent script for Domperidone 10mg  po AC and HS

## 2011-01-04 ENCOUNTER — Telehealth: Payer: Self-pay | Admitting: *Deleted

## 2011-01-05 NOTE — Telephone Encounter (Signed)
No,go ahead with domperidome rx.

## 2011-01-05 NOTE — Telephone Encounter (Signed)
lmom for pt to call back. Pt had called yesterday stating the assistance card for Dexilant would not work and her co pay is $45. She asked that I call [employer] to find ot why. I called and spoke with Lu Duffel who stated he did not know why the card would not go through, it was over a month ago. Will give pt 2 more cards to try for the Dexilant discount. She was given a f/u on 02/06/11 at 09:45am.  Dr Jarold Motto, pt hasn't received the Domperidone yet, are you waiting on it's efficacy before ordering the Bravo study per your last note? Thanks.

## 2011-01-10 ENCOUNTER — Other Ambulatory Visit: Payer: Self-pay | Admitting: *Deleted

## 2011-01-10 NOTE — Telephone Encounter (Signed)
I cannot approve that drug for this patient. She may check with her PCP Dr. Jarold Motto, upon his return

## 2011-01-10 NOTE — Telephone Encounter (Signed)
Pt with hx of Chronic GERD, Atypical Chronic chest Pain, Incomplete Esophageal Emptying r/t documented Nonspecific Esophageal Motility Disorder. Pt is on Roxicet prn for the atypical chest pain alternating with Xanax. Pt was given 35 Roxicet  5/325mg , 1 po q6hr prn/pain on 12/29/10. Pt is going out of town for a long weekend and would like the Roxicet refilled in case she gets into "trouble". Please advise. Thanks.

## 2011-01-10 NOTE — Telephone Encounter (Signed)
Notified pt that Dr Jarold Motto is on vacation this week and the ON Call [facility] will not order the Roxicet since he doesn't know her. Dr Marina Goodell suggested she call her PCP; pt stated understanding.

## 2011-01-11 ENCOUNTER — Telehealth: Payer: Self-pay | Admitting: *Deleted

## 2011-01-11 NOTE — Telephone Encounter (Signed)
Pt called to ask if Domperidone can cause insomnia. Pt has been on the drug since last Saturday and has had problems sleeping. Looked up web site and discussed SE with pt and insomnia was not listed. Pt will come off the Domperidone and see if sleep is improved. She asked that I move her appt up and we r/s to 01/23/11 at 0845am.

## 2011-01-22 ENCOUNTER — Encounter: Payer: Self-pay | Admitting: Gastroenterology

## 2011-01-23 ENCOUNTER — Ambulatory Visit (INDEPENDENT_AMBULATORY_CARE_PROVIDER_SITE_OTHER): Payer: Commercial Managed Care - PPO | Admitting: Gastroenterology

## 2011-01-23 ENCOUNTER — Encounter: Payer: Self-pay | Admitting: Gastroenterology

## 2011-01-23 DIAGNOSIS — K224 Dyskinesia of esophagus: Secondary | ICD-10-CM

## 2011-01-23 DIAGNOSIS — R1310 Dysphagia, unspecified: Secondary | ICD-10-CM

## 2011-01-23 DIAGNOSIS — K219 Gastro-esophageal reflux disease without esophagitis: Secondary | ICD-10-CM

## 2011-01-23 DIAGNOSIS — K589 Irritable bowel syndrome without diarrhea: Secondary | ICD-10-CM

## 2011-01-23 DIAGNOSIS — R0789 Other chest pain: Secondary | ICD-10-CM

## 2011-01-23 NOTE — Patient Instructions (Addendum)
Your Endoscopy with Dr Arlyce Dice is scheduled on 02/01/2011 at 12:30pm at [facility] Separate instructions given DO NOT TAKE YOUR DEXILANT 5 DAYS PRIOR TO YOUR PROCEDURE PER DR Jarold Motto

## 2011-01-23 NOTE — Progress Notes (Signed)
This is a 60-year-old Caucasian female with chronic atypical chest pain, IBS, and specific esophageal motility disorder, chronic anxiety syndrome. She continues with chest pain despite Dexilant 60 mg a day and Reglan 10 mg at bedtime. Outpatient attempts a 24-hour pH probe testing have been unsuccessful. Problems go back over 20 years. He currently is on the care of psychiatry and is currently sleeping better all Lunesta at bedtime.  Current Medications, Allergies, Past Medical History, Past Surgical History, Family History and Social History were reviewed in [facility] electronic medical record.  Pertinent Review of Systems Negative   Physical Exam: Exam unremarkable. She does have a small 3-4 mm movable nodule at the inferior aspect of her umbilicus.    Assessment and Plan: Atypical chest pain, rule out chronic GERD. Endoscopy with placement of Bravo pH probe has been scheduled with Dr. Robert Kaplan. The patient is to be off Reglan and PPI medications for 5 days before this procedure. Further therapy will depend on results of this exam. I suspect most of her problems are related IBS and functional GI complaints. Encounter Diagnoses  Name Primary?  . Dysphagia   . GERD (gastroesophageal reflux disease)   . Esophageal motility disorder   . IBS (irritable bowel syndrome)     

## 2011-01-31 ENCOUNTER — Telehealth: Payer: Self-pay | Admitting: *Deleted

## 2011-01-31 NOTE — Telephone Encounter (Signed)
Per Dr Arlyce Dice, no need to cancel the procedure; notified pt.

## 2011-01-31 NOTE — Telephone Encounter (Signed)
Pt called to report there's a bug going around her office; sinus problems, sore throat, head congestion, and some has progressed to Bronchitis. Pt reports she has sinus problems with drainage and head congestion. Pt has a Bravo test scheduled tomorrow at 12:30pm, should she reschedule?

## 2011-01-31 NOTE — Telephone Encounter (Signed)
That is unnecessary to cancel

## 2011-02-01 ENCOUNTER — Encounter (HOSPITAL_COMMUNITY)
Admission: RE | Disposition: A | Discharge status: Home / Self Care | Payer: Self-pay | Source: Ambulatory Visit | Attending: Gastroenterology

## 2011-02-01 ENCOUNTER — Encounter (INDEPENDENT_AMBULATORY_CARE_PROVIDER_SITE_OTHER): Payer: Commercial Managed Care - PPO | Admitting: Gastroenterology

## 2011-02-01 ENCOUNTER — Encounter (HOSPITAL_COMMUNITY): Payer: Self-pay | Admitting: *Deleted

## 2011-02-01 ENCOUNTER — Ambulatory Visit (HOSPITAL_COMMUNITY)
Admission: RE | Admit: 2011-02-01 | Discharge: 2011-02-01 | Disposition: A | Discharge status: Home / Self Care | Payer: Commercial Managed Care - PPO | Source: Ambulatory Visit | Attending: Gastroenterology | Admitting: Gastroenterology

## 2011-02-01 DIAGNOSIS — K219 Gastro-esophageal reflux disease without esophagitis: Secondary | ICD-10-CM

## 2011-02-01 DIAGNOSIS — R079 Chest pain, unspecified: Secondary | ICD-10-CM

## 2011-02-01 DIAGNOSIS — K224 Dyskinesia of esophagus: Secondary | ICD-10-CM

## 2011-02-01 DIAGNOSIS — R0789 Other chest pain: Secondary | ICD-10-CM | POA: Insufficient documentation

## 2011-02-01 HISTORY — PX: BRAVO PH STUDY: SHX5421

## 2011-02-01 HISTORY — DX: Diaphragmatic hernia without obstruction or gangrene: K44.9

## 2011-02-01 HISTORY — PX: ESOPHAGOGASTRODUODENOSCOPY: SHX1529

## 2011-02-01 SURGERY — PH MONITORING, ESOPHAGUS, WIRELESS
Anesthesia: Moderate Sedation

## 2011-02-01 MED ORDER — DIPHENHYDRAMINE HCL 50 MG/ML IJ SOLN
INTRAMUSCULAR | Status: DC | PRN
  Administered 2011-02-01 (×2): 25 mg via INTRAVENOUS

## 2011-02-01 MED ORDER — BUTAMBEN-TETRACAINE-BENZOCAINE 2-2-14 % EX AERO
INHALATION_SPRAY | CUTANEOUS | Status: DC | PRN
  Administered 2011-02-01: 1 via TOPICAL

## 2011-02-01 MED ORDER — GLYCOPYRROLATE 0.2 MG/ML IJ SOLN
INTRAMUSCULAR | Status: DC | PRN
  Administered 2011-02-01: 0.2 mg via INTRAVENOUS

## 2011-02-01 MED ORDER — MIDAZOLAM HCL 10 MG/2ML IJ SOLN
INTRAMUSCULAR | Status: DC | PRN
  Administered 2011-02-01 (×5): 2 mg via INTRAVENOUS

## 2011-02-01 MED ORDER — FENTANYL CITRATE 0.05 MG/ML IJ SOLN
INTRAMUSCULAR | Status: AC
  Filled 2011-02-01: qty 2

## 2011-02-01 MED ORDER — FENTANYL CITRATE 0.05 MG/ML IJ SOLN
INTRAMUSCULAR | Status: DC | PRN
  Administered 2011-02-01 (×4): 25 ug via INTRAVENOUS

## 2011-02-01 MED ORDER — MIDAZOLAM HCL 10 MG/2ML IJ SOLN
INTRAMUSCULAR | Status: AC
  Filled 2011-02-01: qty 2

## 2011-02-01 MED ORDER — SODIUM CHLORIDE 0.9 % IV SOLN: Freq: Once | INTRAVENOUS | Status: DC

## 2011-02-01 MED ORDER — GLYCOPYRROLATE 0.2 MG/ML IJ SOLN
INTRAMUSCULAR | Status: AC
  Filled 2011-02-01: qty 1

## 2011-02-01 MED ORDER — DIPHENHYDRAMINE HCL 50 MG/ML IJ SOLN
INTRAMUSCULAR | Status: AC
  Filled 2011-02-01: qty 1

## 2011-02-01 NOTE — Interval H&P Note (Signed)
History and Physical Interval Note:   02/01/2011   12:57 PM   [name]  has presented today for surgery, with the diagnosis of GERD   The various methods of treatment have been discussed with the patient and family. After consideration of risks, benefits and other options for treatment, the patient has consented to  Procedure(s): BRAVO PH STUDY as a surgical intervention .  The patients' history has been reviewed, patient examined, no change in status, stable for surgery.  I have reviewed the patients' chart and labs.  Questions were answered to the patient's satisfaction.     Caitlin Heaps  MD

## 2011-02-01 NOTE — H&P (View-Only) (Signed)
This is a 60 year old Caucasian female with chronic atypical chest pain, IBS, and specific esophageal motility disorder, chronic anxiety syndrome. She continues with chest pain despite Dexilant 60 mg a day and Reglan 10 mg at bedtime. Outpatient attempts a 24-hour pH probe testing have been unsuccessful. Problems go back over 20 years. He currently is on the care of psychiatry and is currently sleeping better all Lunesta at bedtime.  Current Medications, Allergies, Past Medical History, Past Surgical History, Family History and Social History were reviewed in [employer] record.  Pertinent Review of Systems Negative   Physical Exam: Exam unremarkable. She does have a small 3-4 mm movable nodule at the inferior aspect of her umbilicus.    Assessment and Plan: Atypical chest pain, rule out chronic GERD. Endoscopy with placement of Bravo pH probe has been scheduled with Dr. Melvia Heaps. The patient is to be off Reglan and PPI medications for 5 days before this procedure. Further therapy will depend on results of this exam. I suspect most of her problems are related IBS and functional GI complaints. Encounter Diagnoses  Name Primary?  Marland Kitchen Dysphagia   . GERD (gastroesophageal reflux disease)   . Esophageal motility disorder   . IBS (irritable bowel syndrome)

## 2011-02-02 ENCOUNTER — Encounter (HOSPITAL_COMMUNITY): Payer: Self-pay

## 2011-02-06 ENCOUNTER — Ambulatory Visit: Payer: Commercial Managed Care - PPO | Admitting: Gastroenterology

## 2011-02-12 ENCOUNTER — Encounter (HOSPITAL_COMMUNITY): Payer: Self-pay | Admitting: Gastroenterology

## 2011-02-16 ENCOUNTER — Encounter: Payer: Self-pay | Admitting: Gastroenterology

## 2011-02-16 ENCOUNTER — Ambulatory Visit (INDEPENDENT_AMBULATORY_CARE_PROVIDER_SITE_OTHER): Payer: Commercial Managed Care - PPO | Admitting: Gastroenterology

## 2011-02-16 VITALS — BP 110/68 | HR 68 | Ht 65.0 in | Wt 122.4 lb

## 2011-02-16 DIAGNOSIS — K219 Gastro-esophageal reflux disease without esophagitis: Secondary | ICD-10-CM

## 2011-02-16 DIAGNOSIS — R079 Chest pain, unspecified: Secondary | ICD-10-CM | POA: Insufficient documentation

## 2011-02-16 DIAGNOSIS — G8929 Other chronic pain: Secondary | ICD-10-CM

## 2011-02-16 DIAGNOSIS — K224 Dyskinesia of esophagus: Secondary | ICD-10-CM

## 2011-02-16 DIAGNOSIS — I7300 Raynaud's syndrome without gangrene: Secondary | ICD-10-CM

## 2011-02-16 MED ORDER — SUCRALFATE 1 GM/10ML PO SUSP: ORAL | Status: DC

## 2011-02-16 MED ORDER — OXYCODONE-ACETAMINOPHEN 5-325 MG PO TABS: 1.0000 | ORAL_TABLET | Freq: Four times a day (QID) | ORAL | Status: DC | PRN

## 2011-02-16 NOTE — Progress Notes (Signed)
This is a 60 year old Caucasian female with chronic chest pain felt secondary to acid reflux. This was recently confirmed about Bravo 48 hour pH probe testing which was reviewed today with the patient. She had evidence of marked acid reflux mostly in the upper right and postprandial position, but also an element of prolonged nocturnal acid reflux. There is excellent correlation of her symptoms with acid reflux. Total DeMeester score was 43, normal less than 15.  The patient continues to have mostly nocturnal symptomatology. She does have Raynaud's phenomenon confirm esophageal manometry with 50% a peristalsis, mean amplitude of esophageal contractions 51 mm of mercury.  Current Medications, Allergies, Past Medical History, Past Surgical History, Family History and Social History were reviewed in [employer] record.  Pertinent Review of Systems Negative... neurologic cardiovascular or pulmonary complaints. She has no symptoms of collagen vascular disease. History noncontributory.   Physical Exam: Strangely cold hands with some cyanosis. Physical exam otherwise deferred. Mental status is normal.    Assessment and Plan: Chronic acid reflux and an associated esophageal motility disorder associated with Raynaud's phenomenon. She is a poor surgical candidate because of these problems. I have changed her Dexilant to 60 mg before supper, Reglan 10 mg 30 minutes before bedtime, and Carafate suspension 1 tablespoon at bedtime. I suspect that some of her symptoms are related to reflux of gastric -bilious contents. She will see me back in 4-6 weeks' time for followup. Encounter Diagnoses  Name Primary?  . GERD (gastroesophageal reflux disease) Yes  . Raynaud phenomenon   . Chronic chest pain   . Motility disorder, esophageal

## 2011-02-16 NOTE — Progress Notes (Signed)
This report is a some report of this patient's BRAVO 48 hour pH study on 01/31/2001. On day one, patient had severe acid reflux in the upright and supine position with 134 reflux episodes, 7 longer than 5 minutes. Her total DeMeester score was 43. There is excellent correlation between her symptomatology of chest pain and belching. On day 2 the patient also had acid reflux in the upright and supine position not as severe as day one with a DeMeester score 40. On reviewing the graphic report, the patient had a rather prolonged episode of acid reflux at the initiation of bedtime. She had a lot of postprandial acid reflux, and actually seemed to do fairly well during the daytime. Again, there is excellent correlation with reflux episodes and her symptomatology.  Assessment And Plans: This patient has acid reflux of a rather marked degree with a known esophageal motility disorder associated with her Raynaud's phenomena. She was seen in my office on 02/16/2011 and changes in her reflex regime were detailed in my note. This patient is not a good candidate for fundoplication surgery because of her esophageal motility disorder.

## 2011-02-16 NOTE — Patient Instructions (Signed)
Take Dexilant 30 min before supper. Take Reglan 30 min before bed.  Take Carafate at bedtime.  We have printed your Roxicet rx and given it to you today.  Make office visit to come back and see Dr Jarold Motto in 1 month.

## 2011-03-19 ENCOUNTER — Encounter: Payer: Self-pay | Admitting: Gastroenterology

## 2011-03-19 ENCOUNTER — Ambulatory Visit (INDEPENDENT_AMBULATORY_CARE_PROVIDER_SITE_OTHER): Payer: Commercial Managed Care - PPO | Admitting: Gastroenterology

## 2011-03-19 VITALS — BP 118/64 | HR 76 | Ht 65.0 in | Wt 122.0 lb

## 2011-03-19 DIAGNOSIS — I7300 Raynaud's syndrome without gangrene: Secondary | ICD-10-CM

## 2011-03-19 DIAGNOSIS — K219 Gastro-esophageal reflux disease without esophagitis: Secondary | ICD-10-CM

## 2011-03-19 MED ORDER — OXYCODONE-ACETAMINOPHEN 10-325 MG PO TABS: 1.0000 | ORAL_TABLET | ORAL | Status: AC | PRN

## 2011-03-19 NOTE — Progress Notes (Signed)
History of Present Illness: This is a very nice 60 year old Caucasian female with Raynaud's phenomenon, associated esophageal motility disorder, refractory GERD, and recurrent chest pain. She currently is markedly improved on Dexilant 60 mg in the morning with Reglan 10 mg before bed. Actually, her chest pains have almost completely subsided, but she does use when necessary oxycodone for severe pain. She additionally takes Cymbalta 20 mg a day and Adderall 30 mg a day with when necessary Xanax.    Current Medications, Allergies, Past Medical History, Past Surgical History, Family History and Social History were reviewed in [employer] record.   Assessment and plan: Continue current medications with followup in 6 months time or when necessary as needed. I have urged her to wear gloves and to keep her core body temperature warm because of her Raynaud's phenomenon. She of course is to continue standard anti-reflux maneuvers, also when necessary liquid Carafate use. This has caused some constipation, and I advised when necessary milk of magnesia as needed. No diagnosis found.

## 2011-03-19 NOTE — Patient Instructions (Signed)
Rx for Oxycodone 10-325 printed and give to you today.

## 2011-03-30 ENCOUNTER — Other Ambulatory Visit: Payer: Self-pay | Admitting: *Deleted

## 2011-03-30 MED ORDER — OXYCODONE-ACETAMINOPHEN 10-325 MG PO TABS: ORAL_TABLET | ORAL | Status: DC

## 2011-03-30 NOTE — Telephone Encounter (Signed)
Pt reports she went out of town this week and has lost the bag with her medicines. She reports she has reordered everything but the Percocet, which, she states you increased on the 03/19/11 OV. ( 03/19/11 35 pills whic is adequate for 4 tabs daily for 11 days) Can we refill the Percocet? Thanks.

## 2011-03-30 NOTE — Telephone Encounter (Signed)
Notified pt I left the script at the front desk; pt stated understanding.

## 2011-03-30 NOTE — Telephone Encounter (Signed)
YES

## 2011-04-27 ENCOUNTER — Telehealth: Payer: Self-pay | Admitting: *Deleted

## 2011-04-27 MED ORDER — OXYCODONE-ACETAMINOPHEN 10-325 MG PO TABS: ORAL_TABLET | ORAL | Status: DC

## 2011-04-27 NOTE — Telephone Encounter (Signed)
Informed pt her script is at the front desk. 

## 2011-04-27 NOTE — Telephone Encounter (Signed)
Ok,she does not abuse rx..refill please

## 2011-04-27 NOTE — Telephone Encounter (Signed)
Pt called to request a refill on Percocet; pt reports she take 1 QHS to help her sleep. Last filled on 03/30/11 for 35 tabs. Refill? Please advise. Thanks.

## 2011-05-01 ENCOUNTER — Other Ambulatory Visit: Payer: Self-pay | Admitting: Obstetrics and Gynecology

## 2011-05-01 DIAGNOSIS — Z1231 Encounter for screening mammogram for malignant neoplasm of breast: Secondary | ICD-10-CM

## 2011-05-21 ENCOUNTER — Other Ambulatory Visit: Payer: Self-pay | Admitting: *Deleted

## 2011-05-21 MED ORDER — OXYCODONE-ACETAMINOPHEN 10-325 MG PO TABS: ORAL_TABLET | ORAL | Status: DC

## 2011-05-21 NOTE — Telephone Encounter (Signed)
Pt called to request a refill on her percocet. Left script at the front desk.

## 2011-06-06 ENCOUNTER — Ambulatory Visit: Payer: Commercial Managed Care - PPO

## 2011-06-13 ENCOUNTER — Ambulatory Visit
Admission: RE | Admit: 2011-06-13 | Discharge: 2011-06-13 | Disposition: A | Discharge status: Home / Self Care | Payer: Commercial Managed Care - PPO | Source: Ambulatory Visit | Attending: Obstetrics and Gynecology | Admitting: Obstetrics and Gynecology

## 2011-06-13 DIAGNOSIS — Z1231 Encounter for screening mammogram for malignant neoplasm of breast: Secondary | ICD-10-CM

## 2011-09-26 ENCOUNTER — Other Ambulatory Visit: Payer: Self-pay | Admitting: Gastroenterology

## 2011-10-31 ENCOUNTER — Other Ambulatory Visit: Payer: Self-pay | Admitting: Obstetrics and Gynecology

## 2012-05-07 ENCOUNTER — Other Ambulatory Visit: Payer: Self-pay | Admitting: Obstetrics and Gynecology

## 2012-05-07 DIAGNOSIS — Z1231 Encounter for screening mammogram for malignant neoplasm of breast: Secondary | ICD-10-CM

## 2012-06-05 ENCOUNTER — Telehealth: Payer: Self-pay | Admitting: Gastroenterology

## 2012-06-05 NOTE — Telephone Encounter (Signed)
Dr Jarold Motto, pt has a problem with Versed and Fentanyl as sedation for COLONs so her last one Dr Christella Hartigan did at [facility] in 2009. Do you think she will be OK with Propofol?

## 2012-06-06 NOTE — Telephone Encounter (Signed)
Scheduled pt for PV and recall COLON. cb

## 2012-06-06 NOTE — Telephone Encounter (Signed)
I see no problem with propofol

## 2012-06-06 NOTE — Telephone Encounter (Signed)
lmom for pt to call back

## 2012-06-13 ENCOUNTER — Ambulatory Visit
Admission: RE | Admit: 2012-06-13 | Discharge: 2012-06-13 | Disposition: A | Discharge status: Home / Self Care | Payer: Commercial Managed Care - PPO | Source: Ambulatory Visit | Attending: Obstetrics and Gynecology | Admitting: Obstetrics and Gynecology

## 2012-06-13 DIAGNOSIS — Z9882 Breast implant status: Secondary | ICD-10-CM

## 2012-06-13 DIAGNOSIS — Z1231 Encounter for screening mammogram for malignant neoplasm of breast: Secondary | ICD-10-CM

## 2012-06-19 ENCOUNTER — Ambulatory Visit (AMBULATORY_SURGERY_CENTER): Payer: Commercial Managed Care - PPO

## 2012-06-19 VITALS — Ht 65.0 in | Wt 124.4 lb

## 2012-06-19 DIAGNOSIS — Z860100 Personal history of colon polyps, unspecified: Secondary | ICD-10-CM

## 2012-06-19 MED ORDER — MOVIPREP 100 G PO SOLR: 1.0000 | Freq: Once | ORAL | Status: DC

## 2012-06-24 ENCOUNTER — Encounter: Payer: Self-pay | Admitting: Gastroenterology

## 2012-07-16 ENCOUNTER — Encounter: Payer: Self-pay | Admitting: Gastroenterology

## 2012-07-16 ENCOUNTER — Encounter: Payer: Commercial Managed Care - PPO | Admitting: Gastroenterology

## 2012-07-16 ENCOUNTER — Other Ambulatory Visit: Payer: Self-pay | Admitting: *Deleted

## 2012-07-16 ENCOUNTER — Ambulatory Visit (AMBULATORY_SURGERY_CENTER): Payer: Commercial Managed Care - PPO | Admitting: Gastroenterology

## 2012-07-16 VITALS — BP 122/68 | HR 55 | Temp 97.5°F | Resp 21 | Ht 65.0 in | Wt 124.0 lb

## 2012-07-16 DIAGNOSIS — Z800 Family history of malignant neoplasm of digestive organs: Secondary | ICD-10-CM

## 2012-07-16 DIAGNOSIS — Z1211 Encounter for screening for malignant neoplasm of colon: Secondary | ICD-10-CM

## 2012-07-16 DIAGNOSIS — Z86010 Personal history of colonic polyps: Secondary | ICD-10-CM

## 2012-07-16 HISTORY — PX: COLONOSCOPY: SHX174

## 2012-07-16 MED ORDER — OXYCODONE-ACETAMINOPHEN 10-325 MG PO TABS: ORAL_TABLET | ORAL | Status: DC

## 2012-07-16 MED ORDER — LINACLOTIDE 145 MCG PO CAPS: ORAL_CAPSULE | ORAL | Status: DC

## 2012-07-16 MED ORDER — SODIUM CHLORIDE 0.9 % IV SOLN: 500.0000 mL | INTRAVENOUS | Status: DC

## 2012-07-16 NOTE — Patient Instructions (Signed)
YOU HAD AN ENDOSCOPIC PROCEDURE TODAY AT THE [facility]: Refer to the procedure report that was given to you for any specific questions about what was found during the examination.  If the procedure report does not answer your questions, please call your gastroenterologist to clarify.  If you requested that your care partner not be given the details of your procedure findings, then the procedure report has been included in a sealed envelope for you to review at your convenience later.  YOU SHOULD EXPECT: Some feelings of bloating in the abdomen. Passage of more gas than usual.  Walking can help get rid of the air that was put into your GI tract during the procedure and reduce the bloating. If you had a lower endoscopy (such as a colonoscopy or flexible sigmoidoscopy) you may notice spotting of blood in your stool or on the toilet paper. If you underwent a bowel prep for your procedure, then you may not have a normal bowel movement for a few days.  DIET: Your first meal following the procedure should be a light meal and then it is ok to progress to your normal diet.  A half-sandwich or bowl of soup is an example of a good first meal.  Heavy or fried foods are harder to digest and may make you feel nauseous or bloated.  Likewise meals heavy in dairy and vegetables can cause extra gas to form and this can also increase the bloating.  Drink plenty of fluids but you should avoid alcoholic beverages for 24 hours.  ACTIVITY: Your care partner should take you home directly after the procedure.  You should plan to take it easy, moving slowly for the rest of the day.  You can resume normal activity the day after the procedure however you should NOT DRIVE or use heavy machinery for 24 hours (because of the sedation medicines used during the test).    SYMPTOMS TO REPORT IMMEDIATELY: A gastroenterologist can be reached at any hour.  During normal business hours, 8:30 AM to 5:00 PM Monday through Friday,  call [phone].  After hours and on weekends, please call the GI answering service at [phone] who will take a message and have the physician on call contact you.   Following lower endoscopy (colonoscopy or flexible sigmoidoscopy):  Excessive amounts of blood in the stool  Significant tenderness or worsening of abdominal pains  Swelling of the abdomen that is new, acute  Fever of 100F or higher    FOLLOW UP: If any biopsies were taken you will be contacted by phone or by letter within the next 1-3 weeks.  Call your gastroenterologist if you have not heard about the biopsies in 3 weeks.  Our staff will call the home number listed on your records the next business day following your procedure to check on you and address any questions or concerns that you may have at that time regarding the information given to you following your procedure. This is a courtesy call and so if there is no answer at the home number and we have not heard from you through the emergency physician on call, we will assume that you have returned to your regular daily activities without incident.  SIGNATURES/CONFIDENTIALITY: You and/or your care partner have signed paperwork which will be entered into your electronic medical record.  These signatures attest to the fact that that the information above on your After Visit Summary has been reviewed and is understood.  Full responsibility of the confidentiality   of this discharge information lies with you and/or your care-partner.   REPEAT COLONOSCOPY 5 YRS

## 2012-07-16 NOTE — Progress Notes (Signed)
Patient did not experience any of the following events: a burn prior to discharge; a fall within the facility; wrong site/side/patient/procedure/implant event; or a hospital transfer or hospital admission upon discharge from the facility. (G8907) Patient did not have preoperative order for IV antibiotic SSI prophylaxis. (G8918)  

## 2012-07-16 NOTE — Progress Notes (Signed)
Lidocaine-40mg IV prior to Propofol InductionPropofol given over incremental dosages 

## 2012-07-16 NOTE — Op Note (Addendum)
[facility] [address] [place], [address]   COLONOSCOPY PROCEDURE REPORT  PATIENT: Caitlin, Best  MR#: [id number] BIRTHDATE: 11/06/1950 , 61  yrs. old GENDER: Female ENDOSCOPIST: Mardella Layman, MD, [facility] REFERRED BY: PROCEDURE DATE:  07/16/2012 PROCEDURE:   Colonoscopy, screening ASA CLASS:   Class II INDICATIONS:Patient's immediate family history of colon cancer. MEDICATIONS: propofol (Diprivan) 300mg  IV  DESCRIPTION OF PROCEDURE:   After the risks and benefits and of the procedure were explained, informed consent was obtained.  A digital rectal exam revealed no abnormalities of the rectum.    The LB [id number]-[id number]  endoscope was introduced through the anus and advanced to the cecum, which was identified by both the appendix and ileocecal valve .  The quality of the prep was adequate. .  The instrument was then slowly withdrawn as the colon was fully examined.     COLON FINDINGS: A normal appearing cecum, ileocecal valve, and appendiceal orifice were identified.  The ascending, hepatic flexure, transverse, splenic flexure, descending, sigmoid colon and rectum appeared unremarkable.  No polyps or cancers were seen. Retroflexed views revealed no abnormalities.     The scope was then withdrawn from the patient and the procedure completed.  COMPLICATIONS: There were no complications. ENDOSCOPIC IMPRESSION: Normal colon ...no polyps noted and no signs of rectal prolapse.  RECOMMENDATIONS: 1.  Repeat Colonoscopy in 5 years. 2.  Continue current medications   REPEAT EXAM:  cc:R.  Robley Fries, MD  _______________________________ eSigned:  Mardella Layman, MD, [facility] 07/16/2012 9:52 AM Revised: 07/16/2012 9:52 AM

## 2012-07-17 ENCOUNTER — Telehealth: Payer: Self-pay | Admitting: *Deleted

## 2012-07-17 NOTE — Telephone Encounter (Signed)
  Follow up Call-  Call back number 07/16/2012 11/06/2010  Post procedure Call Back phone  # [phone]  Permission to leave phone message Yes -     Patient questions:  Do you have a fever, pain , or abdominal swelling? no Pain Score  0 *  Have you tolerated food without any problems? yes  Have you been able to return to your normal activities? yes  Do you have any questions about your discharge instructions: Diet   no Medications  no Follow up visit  no  Do you have questions or concerns about your Care? no  Actions: * If pain score is 4 or above: No action needed, pain <4.

## 2012-07-17 NOTE — Telephone Encounter (Signed)
Faxed pt instructions and appt time of her EM.

## 2012-08-11 ENCOUNTER — Ambulatory Visit (HOSPITAL_COMMUNITY)
Admission: RE | Admit: 2012-08-11 | Payer: Commercial Managed Care - PPO | Source: Ambulatory Visit | Admitting: Gastroenterology

## 2012-08-11 ENCOUNTER — Encounter (HOSPITAL_COMMUNITY): Admission: RE | Payer: Self-pay | Source: Ambulatory Visit

## 2012-08-11 SURGERY — MANOMETRY, ESOPHAGUS

## 2012-10-13 ENCOUNTER — Telehealth: Payer: Self-pay | Admitting: *Deleted

## 2012-10-13 MED ORDER — OXYCODONE-ACETAMINOPHEN 10-325 MG PO TABS: ORAL_TABLET | ORAL | Status: DC

## 2012-10-13 NOTE — Telephone Encounter (Signed)
Dr Jarold Motto, OK to refill Percocet for pt? Thanks.

## 2012-10-13 NOTE — Telephone Encounter (Signed)
yes

## 2012-10-13 NOTE — Telephone Encounter (Signed)
Informed pt I left her script at the front desk.

## 2012-11-18 ENCOUNTER — Telehealth: Payer: Self-pay | Admitting: *Deleted

## 2012-11-18 NOTE — Telephone Encounter (Signed)
no

## 2012-11-18 NOTE — Telephone Encounter (Signed)
Informed pt Dr Jarold Motto will not refill her Percocet; suggested pt call her PCP. Pt stated understanding.

## 2012-11-18 NOTE — Telephone Encounter (Signed)
Pt wants to know if we will refill her Percocet? Thanks.

## 2013-02-20 ENCOUNTER — Ambulatory Visit
Admission: RE | Admit: 2013-02-20 | Discharge: 2013-02-20 | Disposition: A | Discharge status: Home / Self Care | Payer: Commercial Managed Care - PPO | Source: Ambulatory Visit | Attending: Internal Medicine | Admitting: Internal Medicine

## 2013-02-20 ENCOUNTER — Other Ambulatory Visit: Payer: Self-pay | Admitting: Internal Medicine

## 2013-02-20 DIAGNOSIS — E041 Nontoxic single thyroid nodule: Secondary | ICD-10-CM

## 2013-02-23 ENCOUNTER — Other Ambulatory Visit: Payer: Self-pay | Admitting: Internal Medicine

## 2013-02-23 DIAGNOSIS — E041 Nontoxic single thyroid nodule: Secondary | ICD-10-CM

## 2013-02-24 ENCOUNTER — Other Ambulatory Visit (HOSPITAL_COMMUNITY)
Admission: RE | Admit: 2013-02-24 | Discharge: 2013-02-24 | Disposition: A | Discharge status: Home / Self Care | Payer: Commercial Managed Care - PPO | Source: Ambulatory Visit | Attending: Interventional Radiology | Admitting: Interventional Radiology

## 2013-02-24 ENCOUNTER — Ambulatory Visit
Admission: RE | Admit: 2013-02-24 | Discharge: 2013-02-24 | Disposition: A | Discharge status: Home / Self Care | Payer: Commercial Managed Care - PPO | Source: Ambulatory Visit | Attending: Internal Medicine | Admitting: Internal Medicine

## 2013-02-24 DIAGNOSIS — E041 Nontoxic single thyroid nodule: Secondary | ICD-10-CM | POA: Insufficient documentation

## 2013-05-04 ENCOUNTER — Other Ambulatory Visit: Payer: Self-pay

## 2013-05-04 DIAGNOSIS — Z1231 Encounter for screening mammogram for malignant neoplasm of breast: Secondary | ICD-10-CM

## 2013-05-22 ENCOUNTER — Ambulatory Visit (INDEPENDENT_AMBULATORY_CARE_PROVIDER_SITE_OTHER): Payer: 59

## 2013-05-22 VITALS — BP 144/83 | HR 62 | Resp 16 | Ht 65.0 in | Wt 120.0 lb

## 2013-05-22 DIAGNOSIS — M79673 Pain in unspecified foot: Secondary | ICD-10-CM

## 2013-05-22 DIAGNOSIS — L600 Ingrowing nail: Secondary | ICD-10-CM

## 2013-05-22 DIAGNOSIS — L03039 Cellulitis of unspecified toe: Secondary | ICD-10-CM

## 2013-05-22 DIAGNOSIS — M79609 Pain in unspecified limb: Secondary | ICD-10-CM

## 2013-05-22 DIAGNOSIS — M2010 Hallux valgus (acquired), unspecified foot: Secondary | ICD-10-CM

## 2013-05-22 DIAGNOSIS — Q828 Other specified congenital malformations of skin: Secondary | ICD-10-CM

## 2013-05-22 MED ORDER — CEPHALEXIN 500 MG PO CAPS: 500.0000 mg | ORAL_CAPSULE | Freq: Three times a day (TID) | ORAL | Status: DC

## 2013-05-22 NOTE — Patient Instructions (Signed)

## 2013-05-22 NOTE — Progress Notes (Signed)
   Subjective:    Patient ID: [name], female    DOB: 07/20/1950, 63 y.o.   MRN: [id number]  HPI Comments: "I have a painful bunion and an ingrown nail"  Patient c/o achy 1st MPJ left for few months. The area is red, swollen. She has callused area interdigital 1st/2nd toes left. She did have bunion surgery 16 years ago.  Also, patient c/o painful 1st toe right, both borders, for several weeks. The area is red, swollen and draining. She has tried clipping-no help.  Patient is an active runner, running about 20 miles/week.  Foot Pain      Review of Systems  All other systems reviewed and are negative.       Objective:   Physical Exam Vascular status is intact with pedal pulses palpable DP postal for PT plus to 4 Refill time 3 seconds all digits skin temperature warm turgor normal no edema rubor pallor or varicosities noted dermatologically skin color pigment normal hair growth absent nails somewhat criptotic of the right hallux nail has paronychia lateral nail fold ingrowing medial nail fold. Patient's previous permanent nail excision of the left hallux with good success in the past. Patient also is a history of previous bunion surgery however has recurrence of bunions bilateral pedal had a prior simple bunionectomy done as much as 15 years ago however has residual bunion with lateral deviation of the hallux and interdigital keratoses in the first and second toes bilateral due to the bunion recurrence. X-rays bilateral confirm IM angle 12 hallux abductus angle 2 left foot hallux abductus angle 20 sesamoid position 6 hammertoes 2 through 5 left foot and there is also dorsal spurring first metatarsal head history at evidence of previous simple bunionectomy. There is inferior retrocalcaneal spurring noted radiographically as well. Patient is pain and left great toe joint interdigitally first and second toe and along the dorsomedial eminence on left foot right foot toe has paronychia  borders also some residual bunion deformity.       Assessment & Plan:  Assessments at this time bunion deformity capsulitis and 4 keratoses adjacent digits went to bilateral will be addressed at a later date literature on bunion for surgical correction are dispensed also some tube foam padding dispensed to cushion or separate the toes a temporary alternative treatment long-term treatment may require reduce surgery with bunionectomy Austin type bunionectomy with osteotomy with pin screw fixation. Will discuss this with a later date for possible consult her at this time there is a concern with paronychia granulation tissue proximal/of the nail folds of the right great toe. Based on symptomology and per patient request my recommendation is time local anesthetic block administered to the right great toe Betadine prep performed the medial lateral borders excised phenol matricectomy followed by alcohol wash Betadine ointment and a tries to dressing applied to the right great toe. Patient will initiate daily soap in with Betadine warm water Neosporin and Band-Aid dressing daily also prescribed an antibiotic likely cephalexin 500 mg 3 times a day x10 days. Recommended Tylenol as needed for pain recheck in 2 weeks for nail check and possible bunion consultation.  Harriet Masson DPM

## 2013-06-05 ENCOUNTER — Ambulatory Visit: Payer: 59

## 2013-06-12 ENCOUNTER — Ambulatory Visit (INDEPENDENT_AMBULATORY_CARE_PROVIDER_SITE_OTHER): Payer: 59

## 2013-06-12 VITALS — BP 121/70 | HR 65 | Resp 16

## 2013-06-12 DIAGNOSIS — B351 Tinea unguium: Secondary | ICD-10-CM

## 2013-06-12 DIAGNOSIS — M79673 Pain in unspecified foot: Secondary | ICD-10-CM

## 2013-06-12 DIAGNOSIS — M79609 Pain in unspecified limb: Secondary | ICD-10-CM

## 2013-06-12 DIAGNOSIS — M2010 Hallux valgus (acquired), unspecified foot: Secondary | ICD-10-CM

## 2013-06-12 MED ORDER — TAVABOROLE 5 % EX SOLN: CUTANEOUS | Status: DC

## 2013-06-12 NOTE — Patient Instructions (Signed)
Pre-Operative Instructions  Congratulations, you have decided to take an important step to improving your quality of life.  You can be assured that the doctors of [facility] will be with you every step of the way.  1. Plan to be at the surgery center/hospital at least 1 (one) hour prior to your scheduled time unless otherwise directed by the surgical center/hospital staff.  You must have a responsible adult accompany you, remain during the surgery and drive you home.  Make sure you have directions to the surgical center/hospital and know how to get there on time. 2. For hospital based surgery you will need to obtain a history and physical form from your family physician within 1 month prior to the date of surgery- we will give you a form for you primary physician.  3. We make every effort to accommodate the date you request for surgery.  There are however, times where surgery dates or times have to be moved.  We will contact you as soon as possible if a change in schedule is required.   4. No Aspirin/Ibuprofen for one week before surgery.  If you are on aspirin, any non-steroidal anti-inflammatory medications (Mobic, Aleve, Ibuprofen) you should stop taking it 7 days prior to your surgery.  You make take Tylenol  For pain prior to surgery.  5. Medications- If you are taking daily heart and blood pressure medications, seizure, reflux, allergy, asthma, anxiety, pain or diabetes medications, make sure the surgery center/hospital is aware before the day of surgery so they may notify you which medications to take or avoid the day of surgery. 6. No food or drink after midnight the night before surgery unless directed otherwise by surgical center/hospital staff. 7. No alcoholic beverages 24 hours prior to surgery.  No smoking 24 hours prior to or 24 hours after surgery. 8. Wear loose pants or shorts- loose enough to fit over bandages, boots, and casts. 9. No slip on shoes, sneakers are best. 10. Bring  your boot with you to the surgery center/hospital.  Also bring crutches or a walker if your physician has prescribed it for you.  If you do not have this equipment, it will be provided for you after surgery. 11. If you have not been contracted by the surgery center/hospital by the day before your surgery, call to confirm the date and time of your surgery. 12. Leave-time from work may vary depending on the type of surgery you have.  Appropriate arrangements should be made prior to surgery with your employer. 13. Prescriptions will be provided immediately following surgery by your doctor.  Have these filled as soon as possible after surgery and take the medication as directed. 14. Remove nail polish on the operative foot. 15. Wash the night before surgery.  The night before surgery wash the foot and leg well with the antibacterial soap provided and water paying special attention to beneath the toenails and in between the toes.  Rinse thoroughly with water and dry well with a towel.  Perform this wash unless told not to do so by your physician.  Enclosed: 1 Ice pack (please put in freezer the night before surgery)   1 Hibiclens skin cleaner   Pre-op Instructions  If you have any questions regarding the instructions, do not hesitate to call our office.  [place]: [address] [place], [place] [address] [phone]  [place]: [address] [place], [place] [address] [phone]  [place]: [address]  [place],  [address] [phone]  Dr. Richard   Tuchman DPM, Dr. Norman Regal DPM Dr. Richard Sikora DPM, Dr. M. Todd Hyatt DPM, Dr. Kathryn Egerton DPM 

## 2013-06-12 NOTE — Progress Notes (Signed)
   Subjective:    Patient ID: [name], female    DOB: Jan 05, 1951, 63 y.o.   MRN: [id number]  HPI Comments: "The toe is fine now, but thought it would never heal."        Ingrown toenail - Follow up 1st toe right - both borders   "He is going to talk to me about doing bunion surgery too on this left foot     Review of Systems no new changes or findings     Objective:   Physical Exam Neurovascular status is intact pedal pulses are palpable patient does have a resolving AP nail procedure of the hallux with no discharge or drainage for his adjacent nails which are thickened yellow brittle crumbly consistent with onychomycosis this time discussed topical antifungal therapies patient will initiate topical keratin application pharmacy will for the medication directed to the patient. Patient also has time is having painful bunion deformity left more so than right knee pain for symptomatic x-rays confirm elevated I am you recall degrees sesamoid position 4 there is pain and some crepitus under flexion plantar flexion first MTP area left more so than right this time discussed options for [facility] bunionectomy with pin fixation risk of fascial structures reviewed and consent form is reviewed and signed by the patient.     Assessment & Plan:  Assessment resolving AP nail procedure. Initiate treatment for topical antifungal for onychomycosis of remaining nails. Patient also this time is reviewed consent form for [facility] correction but painful bunion of left foot surgery scheduled her continues with appropriate followup thereafter. All questions asked medication are answered this time patient extensively about 3 months for should have the other foot done will be in air fracture boot for about 5 weeks duration and in good walking tennis or athletic shoe for 2 months thereafter.  Harriet Masson DPM

## 2013-06-15 ENCOUNTER — Ambulatory Visit
Admission: RE | Admit: 2013-06-15 | Discharge: 2013-06-15 | Disposition: A | Discharge status: Home / Self Care | Payer: Commercial Managed Care - PPO | Source: Ambulatory Visit

## 2013-06-15 DIAGNOSIS — Z1231 Encounter for screening mammogram for malignant neoplasm of breast: Secondary | ICD-10-CM

## 2013-06-23 ENCOUNTER — Ambulatory Visit: Payer: 59

## 2013-06-29 ENCOUNTER — Emergency Department (HOSPITAL_COMMUNITY)
Admission: EM | Admit: 2013-06-29 | Discharge: 2013-06-29 | Disposition: A | Discharge status: Home / Self Care | Payer: Commercial Managed Care - PPO | Attending: Emergency Medicine | Admitting: Emergency Medicine

## 2013-06-29 ENCOUNTER — Encounter (HOSPITAL_COMMUNITY): Payer: Self-pay | Admitting: Emergency Medicine

## 2013-06-29 DIAGNOSIS — Z8739 Personal history of other diseases of the musculoskeletal system and connective tissue: Secondary | ICD-10-CM | POA: Insufficient documentation

## 2013-06-29 DIAGNOSIS — Z7982 Long term (current) use of aspirin: Secondary | ICD-10-CM | POA: Insufficient documentation

## 2013-06-29 DIAGNOSIS — R55 Syncope and collapse: Secondary | ICD-10-CM | POA: Insufficient documentation

## 2013-06-29 DIAGNOSIS — Y92410 Unspecified street and highway as the place of occurrence of the external cause: Secondary | ICD-10-CM | POA: Insufficient documentation

## 2013-06-29 DIAGNOSIS — R51 Headache: Secondary | ICD-10-CM | POA: Insufficient documentation

## 2013-06-29 DIAGNOSIS — Z860100 Personal history of colon polyps, unspecified: Secondary | ICD-10-CM | POA: Insufficient documentation

## 2013-06-29 DIAGNOSIS — S0993XA Unspecified injury of face, initial encounter: Secondary | ICD-10-CM | POA: Insufficient documentation

## 2013-06-29 DIAGNOSIS — Z79899 Other long term (current) drug therapy: Secondary | ICD-10-CM | POA: Insufficient documentation

## 2013-06-29 DIAGNOSIS — Z85828 Personal history of other malignant neoplasm of skin: Secondary | ICD-10-CM | POA: Insufficient documentation

## 2013-06-29 DIAGNOSIS — S199XXA Unspecified injury of neck, initial encounter: Secondary | ICD-10-CM

## 2013-06-29 DIAGNOSIS — Z87891 Personal history of nicotine dependence: Secondary | ICD-10-CM | POA: Insufficient documentation

## 2013-06-29 DIAGNOSIS — K219 Gastro-esophageal reflux disease without esophagitis: Secondary | ICD-10-CM | POA: Insufficient documentation

## 2013-06-29 DIAGNOSIS — F341 Dysthymic disorder: Secondary | ICD-10-CM | POA: Insufficient documentation

## 2013-06-29 DIAGNOSIS — Y9389 Activity, other specified: Secondary | ICD-10-CM | POA: Insufficient documentation

## 2013-06-29 LAB — COMPREHENSIVE METABOLIC PANEL
ALBUMIN: 3.9 g/dL (ref 3.5–5.2)
ALT: 14 U/L (ref 0–35)
AST: 21 U/L (ref 0–37)
Alkaline Phosphatase: 71 U/L (ref 39–117)
BUN: 12 mg/dL (ref 6–23)
CO2: 27 mEq/L (ref 19–32)
CREATININE: 0.85 mg/dL (ref 0.50–1.10)
Calcium: 9.3 mg/dL (ref 8.4–10.5)
Chloride: 99 mEq/L (ref 96–112)
GFR calc Af Amer: 85 mL/min — ABNORMAL LOW (ref >90)
GFR calc non Af Amer: 73 mL/min — ABNORMAL LOW (ref >90)
Glucose, Bld: 89 mg/dL (ref 70–99)
Potassium: 3.8 mEq/L (ref 3.7–5.3)
SODIUM: 141 meq/L (ref 137–147)
Total Bilirubin: 0.4 mg/dL (ref 0.3–1.2)
Total Protein: 6.8 g/dL (ref 6.0–8.3)

## 2013-06-29 LAB — URINALYSIS, ROUTINE W REFLEX MICROSCOPIC
Bilirubin Urine: NEGATIVE
Glucose, UA: NEGATIVE mg/dL
Hgb urine dipstick: NEGATIVE
Ketones, ur: NEGATIVE mg/dL
LEUKOCYTES UA: NEGATIVE
Nitrite: NEGATIVE
PH: 7 (ref 5.0–8.0)
Protein, ur: NEGATIVE mg/dL
SPECIFIC GRAVITY, URINE: 1.011 (ref 1.005–1.030)
Urobilinogen, UA: 0.2 mg/dL (ref 0.0–1.0)

## 2013-06-29 LAB — CBC WITH DIFFERENTIAL/PLATELET
BASOS PCT: 1 % (ref 0–1)
Basophils Absolute: 0 10*3/uL (ref 0.0–0.1)
EOS PCT: 2 % (ref 0–5)
Eosinophils Absolute: 0.1 10*3/uL (ref 0.0–0.7)
HEMATOCRIT: 41.5 % (ref 36.0–46.0)
Hemoglobin: 14.6 g/dL (ref 12.0–15.0)
Lymphocytes Relative: 26 % (ref 12–46)
Lymphs Abs: 1 10*3/uL (ref 0.7–4.0)
MCH: 31.5 pg (ref 26.0–34.0)
MCHC: 35.2 g/dL (ref 30.0–36.0)
MCV: 89.6 fL (ref 78.0–100.0)
MONO ABS: 0.3 10*3/uL (ref 0.1–1.0)
Monocytes Relative: 9 % (ref 3–12)
NEUTROS ABS: 2.3 10*3/uL (ref 1.7–7.7)
Neutrophils Relative %: 62 % (ref 43–77)
Platelets: 294 10*3/uL (ref 150–400)
RBC: 4.63 MIL/uL (ref 3.87–5.11)
RDW: 12.3 % (ref 11.5–15.5)
WBC: 3.7 10*3/uL — AB (ref 4.0–10.5)

## 2013-06-29 LAB — TROPONIN I: Troponin I: <0.3 ng/mL (ref <0.30)

## 2013-06-29 LAB — D-DIMER, QUANTITATIVE (NOT AT ARMC)

## 2013-06-29 MED ORDER — IBUPROFEN 800 MG PO TABS
800.0000 mg | ORAL_TABLET | Freq: Once | ORAL | Status: AC
  Administered 2013-06-29: 800 mg via ORAL
  Filled 2013-06-29: qty 1

## 2013-06-29 NOTE — ED Notes (Signed)
Checked Pulse Oximetry during ambulation. Pulse Ox level prior to ambulation 99-100% , HR 63. Pulse Ox level during ambulation 99-100% , HR 82. Pt seemed to ambulate well with no visible issues.

## 2013-06-29 NOTE — Discharge Instructions (Signed)
1. Medications: usual home medications 2. Treatment: rest, drink plenty of fluids, no driving until evaluated by Cardiology 3. Follow Up: Please followup with cardiology within 2 days for further evaluation of your syncope. Return to the emergency department for recurrent symptoms including palpitations, chest pain, shortness of breath or further syncopal episodes.   Syncope Syncope is a fainting spell. This means the person loses consciousness and drops to the ground. The person is generally unconscious for less than 5 minutes. The person may have some muscle twitches for up to 15 seconds before waking up and returning to normal. Syncope occurs more often in elderly people, but it can happen to anyone. While most causes of syncope are not dangerous, syncope can be a sign of a serious medical problem. It is important to seek medical care.  CAUSES  Syncope is caused by a sudden decrease in blood flow to the brain. The specific cause is often not determined. Factors that can trigger syncope include:  Taking medicines that lower blood pressure.  Sudden changes in posture, such as standing up suddenly.  Taking more medicine than prescribed.  Standing in one place for too long.  Seizure disorders.  Dehydration and excessive exposure to heat.  Low blood sugar (hypoglycemia).  Straining to have a bowel movement.  Heart disease, irregular heartbeat, or other circulatory problems.  Fear, emotional distress, seeing blood, or severe pain. SYMPTOMS  Right before fainting, you may:  Feel dizzy or lightheaded.  Feel nauseous.  See all white or all black in your field of vision.  Have cold, clammy skin. DIAGNOSIS  Your caregiver will ask about your symptoms, perform a physical exam, and perform electrocardiography (ECG) to record the electrical activity of your heart. Your caregiver may also perform other heart or blood tests to determine the cause of your syncope. TREATMENT  In most  cases, no treatment is needed. Depending on the cause of your syncope, your caregiver may recommend changing or stopping some of your medicines. HOME CARE INSTRUCTIONS  Have someone stay with you until you feel stable.  Do not drive, operate machinery, or play sports until your caregiver says it is okay.  Keep all follow-up appointments as directed by your caregiver.  Lie down right away if you start feeling like you might faint. Breathe deeply and steadily. Wait until all the symptoms have passed.  Drink enough fluids to keep your urine clear or pale yellow.  If you are taking blood pressure or heart medicine, get up slowly, taking several minutes to sit and then stand. This can reduce dizziness. SEEK IMMEDIATE MEDICAL CARE IF:   You have a severe headache.  You have unusual pain in the chest, abdomen, or back.  You are bleeding from the mouth or rectum, or you have black or tarry stool.  You have an irregular or very fast heartbeat.  You have pain with breathing.  You have repeated fainting or seizure-like jerking during an episode.  You faint when sitting or lying down.  You have confusion.  You have difficulty walking.  You have severe weakness.  You have vision problems. If you fainted, call your local emergency services (911 in [place] Do not drive yourself to the hospital.  MAKE SURE YOU:  Understand these instructions.  Will watch your condition.  Will get help right away if you are not doing well or get worse. Document Released: 03/05/2005 Document Revised: 09/04/2011 Document Reviewed: 05/04/2011 [facility] Patient Information 2014 [place].

## 2013-06-29 NOTE — ED Notes (Addendum)
Muthersbaugh PA at bedside. 

## 2013-06-29 NOTE — ED Notes (Signed)
Pt and family concerned about BP reading, pt informed that when moving around sometimes can cause BP to have a false high reading. Rechecked BP and it was normal. Pt and family sts "we feel a lot better about it being normal".
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
## 2013-06-29 NOTE — ED Provider Notes (Signed)
  This was a shared visit with a mid-level provided (NP or PA).  Throughout the patient's course I was available for consultation/collaboration.  I saw the ECG (if appropriate), relevant labs and studies - I agree with the interpretation.  On my exam the patient was in no distress.      Carmin Muskrat, MD 06/29/13 [phone]
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
## 2013-07-01 ENCOUNTER — Ambulatory Visit (INDEPENDENT_AMBULATORY_CARE_PROVIDER_SITE_OTHER): Payer: Commercial Managed Care - PPO | Admitting: Cardiology

## 2013-07-01 ENCOUNTER — Encounter: Payer: Self-pay | Admitting: Cardiology

## 2013-07-01 ENCOUNTER — Telehealth: Payer: Self-pay

## 2013-07-01 VITALS — BP 117/81 | HR 66 | Ht 64.5 in | Wt 123.0 lb

## 2013-07-01 DIAGNOSIS — R55 Syncope and collapse: Secondary | ICD-10-CM

## 2013-07-01 DIAGNOSIS — I471 Supraventricular tachycardia: Secondary | ICD-10-CM

## 2013-07-01 NOTE — Patient Instructions (Addendum)
NO CHANGES WERE MADE TODAY  NO DRIVING UNTIL FURTHER INSTRUCTED  WE WILL CALL YOU WITH FURTHER RECOMMENDATIONS.  After further review Dr. Marlou Porch would like an Echo and 30 day event monitor.  Your physician has requested that you have an echocardiogram. Echocardiography is a painless test that uses sound waves to create images of your heart. It provides your doctor with information about the size and shape of your heart and how well your heart's chambers and valves are working. This procedure takes approximately one hour. There are no restrictions for this procedure.  Your physician has recommended that you wear an 30 day event monitor. Event monitors are medical devices that record the heart's electrical activity. Doctors most often [place] these monitors to diagnose arrhythmias. Arrhythmias are problems with the speed or rhythm of the heartbeat. The monitor is a small, portable device. You can wear one while you do your normal daily activities. This is usually used to diagnose what is causing palpitations/syncope (passing out).
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
## 2013-07-01 NOTE — Telephone Encounter (Signed)
pt pcp Dr.Gates  rqst pt be seen today by Dr.Skains. for syncope per Dr.Skains ok for pt to be seen today at 3:15 pm.pt aware of appt and verbalized understanding.

## 2013-07-07 ENCOUNTER — Ambulatory Visit (HOSPITAL_COMMUNITY): Payer: Commercial Managed Care - PPO | Attending: Cardiovascular Disease | Admitting: Radiology

## 2013-07-07 ENCOUNTER — Encounter (INDEPENDENT_AMBULATORY_CARE_PROVIDER_SITE_OTHER): Payer: Commercial Managed Care - PPO

## 2013-07-07 ENCOUNTER — Encounter: Payer: Self-pay | Admitting: *Deleted

## 2013-07-07 ENCOUNTER — Encounter: Payer: Self-pay | Admitting: Cardiology

## 2013-07-07 DIAGNOSIS — R55 Syncope and collapse: Secondary | ICD-10-CM

## 2013-07-07 DIAGNOSIS — I471 Supraventricular tachycardia: Secondary | ICD-10-CM

## 2013-07-07 NOTE — Progress Notes (Signed)
Patient ID: [name], female   DOB: April 21, 1950, 63 y.o.   MRN: [id number] [employer] 30 day cardiac event monitor applied to patient.

## 2013-07-07 NOTE — Progress Notes (Signed)
Echocardiogram performed.  

## 2013-07-09 ENCOUNTER — Telehealth: Payer: Self-pay | Admitting: Cardiology

## 2013-07-09 NOTE — Telephone Encounter (Signed)
   Was notified by [facility] that patient was in atrial flutter w/ RVR (160s), but had spontaneously converted back to NSR. I attempted to contact the patient as a check in. I was unable to reach patient by phone. [facility] representative to fax rhythm strips to our office.   Lyda Jester, PA-C

## 2013-07-10 ENCOUNTER — Telehealth: Payer: Self-pay

## 2013-07-10 ENCOUNTER — Other Ambulatory Visit: Payer: Self-pay | Admitting: *Deleted

## 2013-07-10 MED ORDER — METOPROLOL SUCCINATE ER 25 MG PO TB24: 25.0000 mg | ORAL_TABLET | Freq: Every day | ORAL | Status: DC

## 2013-07-10 NOTE — Telephone Encounter (Signed)
Received a [facility] notification of increased and irregular Heart rate after 5pm yesterday. Pt. was called with no answer by PA on call yesterday pm. Called patient today. I questioned her about yesterday 4:30pm, patient was symptomatic, consulting DOD. Pt. describes "Funny feeling in her chest around 5:30pm yesterday afternoon, and a Headache started within a few minutes." She had some chest and jaw pain at that time.  She states she still does not feel right today, anxious and a slight headache. She is still currently wearing the event monitor (26 days left), and patient advised to keep monitor on and if worsening or further symptoms to seek emergency medical help via EMS or ER. Pt. Verbalized her understanding of this. I informed her that I would be calling her back with further after consulting our DOD today, Dr. Lizbeth Bark.

## 2013-07-17 DIAGNOSIS — I499 Cardiac arrhythmia, unspecified: Secondary | ICD-10-CM

## 2013-07-17 HISTORY — DX: Cardiac arrhythmia, unspecified: I49.9

## 2013-07-17 NOTE — Telephone Encounter (Signed)
Spoke to pt. She is having issues with the phone waking her up at night after an electrode falls off. I gave her a few recommendations including turning the sound off on the phone. She thanked me for my help and will continue to wear monitor until next appointment.

## 2013-07-21 ENCOUNTER — Ambulatory Visit: Payer: Commercial Managed Care - PPO | Admitting: Cardiology

## 2013-07-23 ENCOUNTER — Encounter (HOSPITAL_COMMUNITY): Payer: Self-pay | Admitting: Pharmacy Technician

## 2013-07-23 ENCOUNTER — Encounter: Payer: Self-pay | Admitting: *Deleted

## 2013-07-23 ENCOUNTER — Encounter: Payer: Self-pay | Admitting: Internal Medicine

## 2013-07-23 ENCOUNTER — Ambulatory Visit (INDEPENDENT_AMBULATORY_CARE_PROVIDER_SITE_OTHER): Payer: Commercial Managed Care - PPO | Admitting: Internal Medicine

## 2013-07-23 VITALS — BP 100/60 | HR 56 | Ht 64.5 in | Wt 123.0 lb

## 2013-07-23 DIAGNOSIS — I471 Supraventricular tachycardia: Secondary | ICD-10-CM | POA: Insufficient documentation

## 2013-07-23 DIAGNOSIS — I498 Other specified cardiac arrhythmias: Secondary | ICD-10-CM

## 2013-07-23 LAB — CBC WITH DIFFERENTIAL/PLATELET
BASOS PCT: 0.8 % (ref 0.0–3.0)
Basophils Absolute: 0 10*3/uL (ref 0.0–0.1)
EOS PCT: 1.9 % (ref 0.0–5.0)
Eosinophils Absolute: 0.1 10*3/uL (ref 0.0–0.7)
HCT: 42.3 % (ref 36.0–46.0)
HEMOGLOBIN: 14.2 g/dL (ref 12.0–15.0)
Lymphocytes Relative: 38 % (ref 12.0–46.0)
Lymphs Abs: 1.8 10*3/uL (ref 0.7–4.0)
MCHC: 33.6 g/dL (ref 30.0–36.0)
MCV: 93.1 fl (ref 78.0–100.0)
MONO ABS: 0.4 10*3/uL (ref 0.1–1.0)
Monocytes Relative: 8.8 % (ref 3.0–12.0)
NEUTROS ABS: 2.4 10*3/uL (ref 1.4–7.7)
NEUTROS PCT: 50.5 % (ref 43.0–77.0)
Platelets: 319 10*3/uL (ref 150.0–400.0)
RBC: 4.55 Mil/uL (ref 3.87–5.11)
RDW: 12.7 % (ref 11.5–15.5)
WBC: 4.8 10*3/uL (ref 4.0–10.5)

## 2013-07-23 LAB — BASIC METABOLIC PANEL
BUN: 16 mg/dL (ref 6–23)
CHLORIDE: 102 meq/L (ref 96–112)
CO2: 31 mEq/L (ref 19–32)
CREATININE: 0.8 mg/dL (ref 0.4–1.2)
Calcium: 9.3 mg/dL (ref 8.4–10.5)
GFR: 75.37 mL/min (ref >60.00)
Glucose, Bld: 92 mg/dL (ref 70–99)
Potassium: 3.6 mEq/L (ref 3.5–5.1)
SODIUM: 139 meq/L (ref 135–145)

## 2013-07-23 LAB — PROTIME-INR
INR: 1 ratio (ref 0.8–1.0)
Prothrombin Time: 11.2 s (ref 9.6–13.1)

## 2013-07-23 NOTE — Patient Instructions (Signed)
Your physician has recommended that you have an ablation. Catheter ablation is a medical procedure used to treat some cardiac arrhythmias (irregular heartbeats). During catheter ablation, a long, thin, flexible tube is put into a blood vessel in your groin (upper thigh), or neck. This tube is called an ablation catheter. It is then guided to your heart through the blood vessel. Radio frequency waves destroy small areas of heart tissue where abnormal heartbeats may cause an arrhythmia to start. Please see the instruction sheet given to you today.  You will need lab work today: bmp,cbc, INR

## 2013-07-23 NOTE — Assessment & Plan Note (Signed)
The patient has had syncope associated with her SVT and has felt poorly on beta blocker therapy. She has worn a cardiac monitor which demonstrates a narrow QRS tachycardia at 175/min.  I have discussed the risk/benefits/goals/exp[ectations of catheter ablation and she wishes to proceed.

## 2013-07-23 NOTE — Addendum Note (Signed)
Addended by: Evans Lance on: 07/23/2013 03:02 PM   Modules accepted: Orders
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
## 2013-07-24 ENCOUNTER — Ambulatory Visit: Payer: Commercial Managed Care - PPO | Admitting: Internal Medicine

## 2013-07-30 MED ORDER — CHLORHEXIDINE GLUCONATE 4 % EX LIQD
1.0000 "application " | Freq: Once | CUTANEOUS | Status: DC
  Filled 2013-07-30: qty 15

## 2013-07-31 ENCOUNTER — Encounter (HOSPITAL_COMMUNITY)
Admission: RE | Disposition: A | Discharge status: Home / Self Care | Payer: Self-pay | Source: Ambulatory Visit | Attending: Internal Medicine

## 2013-07-31 ENCOUNTER — Encounter (HOSPITAL_COMMUNITY): Payer: Self-pay | Admitting: General Practice

## 2013-07-31 ENCOUNTER — Ambulatory Visit (HOSPITAL_COMMUNITY)
Admission: RE | Admit: 2013-07-31 | Discharge: 2013-08-01 | Disposition: A | Discharge status: Home / Self Care | Payer: Commercial Managed Care - PPO | Source: Ambulatory Visit | Attending: Internal Medicine | Admitting: Internal Medicine

## 2013-07-31 DIAGNOSIS — I471 Supraventricular tachycardia, unspecified: Secondary | ICD-10-CM | POA: Diagnosis present

## 2013-07-31 DIAGNOSIS — Z85828 Personal history of other malignant neoplasm of skin: Secondary | ICD-10-CM | POA: Insufficient documentation

## 2013-07-31 DIAGNOSIS — Z860100 Personal history of colon polyps, unspecified: Secondary | ICD-10-CM | POA: Insufficient documentation

## 2013-07-31 DIAGNOSIS — Z7982 Long term (current) use of aspirin: Secondary | ICD-10-CM | POA: Insufficient documentation

## 2013-07-31 DIAGNOSIS — Z87891 Personal history of nicotine dependence: Secondary | ICD-10-CM | POA: Insufficient documentation

## 2013-07-31 DIAGNOSIS — I4892 Unspecified atrial flutter: Secondary | ICD-10-CM | POA: Insufficient documentation

## 2013-07-31 DIAGNOSIS — K219 Gastro-esophageal reflux disease without esophagitis: Secondary | ICD-10-CM | POA: Insufficient documentation

## 2013-07-31 DIAGNOSIS — F411 Generalized anxiety disorder: Secondary | ICD-10-CM | POA: Insufficient documentation

## 2013-07-31 DIAGNOSIS — M7750 Other enthesopathy of unspecified foot: Secondary | ICD-10-CM | POA: Insufficient documentation

## 2013-07-31 DIAGNOSIS — K589 Irritable bowel syndrome without diarrhea: Secondary | ICD-10-CM | POA: Insufficient documentation

## 2013-07-31 DIAGNOSIS — F1920 Other psychoactive substance dependence, uncomplicated: Secondary | ICD-10-CM | POA: Insufficient documentation

## 2013-07-31 DIAGNOSIS — K449 Diaphragmatic hernia without obstruction or gangrene: Secondary | ICD-10-CM | POA: Insufficient documentation

## 2013-07-31 HISTORY — DX: Cardiac arrhythmia, unspecified: I49.9

## 2013-07-31 HISTORY — PX: SUPRAVENTRICULAR TACHYCARDIA ABLATION: SHX5492

## 2013-07-31 HISTORY — PX: ABLATION OF DYSRHYTHMIC FOCUS: SHX254

## 2013-07-31 SURGERY — SUPRAVENTRICULAR TACHYCARDIA ABLATION
Anesthesia: LOCAL

## 2013-07-31 MED ORDER — SODIUM CHLORIDE 0.9 % IJ SOLN
3.0000 mL | Freq: Two times a day (BID) | INTRAMUSCULAR | Status: DC
  Administered 2013-07-31: 3 mL via INTRAVENOUS

## 2013-07-31 MED ORDER — FENTANYL CITRATE 0.05 MG/ML IJ SOLN
50.0000 ug | INTRAMUSCULAR | Status: DC | PRN
  Administered 2013-07-31: 50 ug via INTRAVENOUS
  Filled 2013-07-31: qty 2

## 2013-07-31 MED ORDER — ALPRAZOLAM ER 1 MG PO TB24: 1.0000 mg | ORAL_TABLET | Freq: Every day | ORAL | Status: DC

## 2013-07-31 MED ORDER — IBUPROFEN 600 MG PO TABS
600.0000 mg | ORAL_TABLET | Freq: Four times a day (QID) | ORAL | Status: DC | PRN
  Administered 2013-07-31: 600 mg via ORAL
  Filled 2013-07-31 (×2): qty 1

## 2013-07-31 MED ORDER — MIDAZOLAM HCL 5 MG/5ML IJ SOLN
INTRAMUSCULAR | Status: AC
  Filled 2013-07-31: qty 5

## 2013-07-31 MED ORDER — ASPIRIN EC 81 MG PO TBEC
81.0000 mg | DELAYED_RELEASE_TABLET | Freq: Every day | ORAL | Status: DC
  Administered 2013-07-31 – 2013-08-01 (×2): 81 mg via ORAL
  Filled 2013-07-31 (×2): qty 1

## 2013-07-31 MED ORDER — SODIUM CHLORIDE 0.9 % IJ SOLN: 3.0000 mL | INTRAMUSCULAR | Status: DC | PRN

## 2013-07-31 MED ORDER — ESTRADIOL 1 MG PO TABS
0.5000 mg | ORAL_TABLET | Freq: Every day | ORAL | Status: DC
  Administered 2013-07-31 – 2013-08-01 (×2): 0.5 mg via ORAL
  Filled 2013-07-31 (×2): qty 0.5

## 2013-07-31 MED ORDER — ZOLPIDEM TARTRATE 5 MG PO TABS: 5.0000 mg | ORAL_TABLET | Freq: Every day | ORAL | Status: DC

## 2013-07-31 MED ORDER — OXYCODONE-ACETAMINOPHEN 5-325 MG PO TABS
ORAL_TABLET | ORAL | Status: AC
  Filled 2013-07-31: qty 2

## 2013-07-31 MED ORDER — HEPARIN (PORCINE) IN NACL 2-0.9 UNIT/ML-% IJ SOLN
INTRAMUSCULAR | Status: AC
  Filled 2013-07-31: qty 500

## 2013-07-31 MED ORDER — FENTANYL CITRATE 0.05 MG/ML IJ SOLN
INTRAMUSCULAR | Status: AC
  Administered 2013-07-31: 50 ug via INTRAVENOUS
  Filled 2013-07-31: qty 2

## 2013-07-31 MED ORDER — BUPIVACAINE HCL (PF) 0.25 % IJ SOLN
INTRAMUSCULAR | Status: AC
  Filled 2013-07-31: qty 30

## 2013-07-31 MED ORDER — ALPRAZOLAM 0.5 MG PO TABS
1.0000 mg | ORAL_TABLET | Freq: Every day | ORAL | Status: DC
  Administered 2013-07-31: 1 mg via ORAL
  Filled 2013-07-31: qty 2

## 2013-07-31 MED ORDER — FENTANYL CITRATE 0.05 MG/ML IJ SOLN
INTRAMUSCULAR | Status: AC
  Filled 2013-07-31: qty 2

## 2013-07-31 MED ORDER — SODIUM CHLORIDE 0.9 % IV SOLN: 250.0000 mL | INTRAVENOUS | Status: DC | PRN

## 2013-07-31 MED ORDER — OXYCODONE-ACETAMINOPHEN 5-325 MG PO TABS
2.0000 | ORAL_TABLET | Freq: Once | ORAL | Status: AC
Start: 2013-07-31 — End: 2013-07-31
  Administered 2013-07-31: 2 via ORAL

## 2013-07-31 MED ORDER — ALPRAZOLAM 0.5 MG PO TABS: 0.5000 mg | ORAL_TABLET | Freq: Three times a day (TID) | ORAL | Status: DC | PRN
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
## 2013-07-31 NOTE — Interval H&P Note (Signed)
History and Physical Interval Note:  07/31/2013 9:12 AM  [name]  has presented today for surgery, with the diagnosis of svt  The various methods of treatment have been discussed with the patient and family. After consideration of risks, benefits and other options for treatment, the patient has consented to  Procedure(s): SUPRAVENTRICULAR TACHYCARDIA ABLATION (N/A) as a surgical intervention .  The patient's history has been reviewed, patient examined, no change in status, stable for surgery.  I have reviewed the patient's chart and labs.  Questions were answered to the patient's satisfaction.     Norlene Duel.D.

## 2013-07-31 NOTE — CV Procedure (Signed)
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 After the usual preparation and draping, intravenous Versed and fentanyl were used for sedation. The patient had a tremendous tolerance for these medications as she takes chronic nerve and pain medications at home. A 6 French hexapolar catheter was inserted percutaneously into the right jugular vein and advanced to the coronary sinus. A 6 French quadripolar catheter was inserted percutaneously into the right femoral vein and advanced to the right ventricle. A 6 French quadripolar catheter was inserted percutaneously into the right femoral vein, and advanced to the His bundle region. After measurement of the basic intervals, rapid ventricular pacing was carried out from the right ventricle and stepwise decreased from 600 ms down to 400 ms per VA block was demonstrated. During rapid ventricular pacing, the atrial activation sequence was midline and decremental. There was VA jumps noted. There was no inducible SVT. Next rapid ventricular stimulation was carried out from the right ventricle at basic cycle length of 600 ms. The S1-S2 interval was stepwise decreased from 540 ms down to 380 ms where retrograde block was observed. During programmed ventricular stimulation, there was no inducible SVT, and the atrial activation was midline and decremental. Next rapid atrial pacing was carried out from the atrium at a pacing cycle length of 600 ms, and stepwise decrease down to 350 ms, where AV block was demonstrated. During rapid atrial pacing, the PR interval was equal to the RR interval, but there was no inducible SVT. Next programmed atrial stimulation  was carried out from the atrium at a basic cycle length of 500 as well as 400 ms. During programmed atrial stimulation, there are multiple AH jumps, echo beats, but no inducible SVT. At this point isoproterenol was infused at rates from 1-4 mics per minute. On isoproterenol at a pacing rate of 310 ms, there was easily inducible SVT. Mapping was carried out during SVT. Mapping demonstrated the earliest atrial activation to occur in the region of the His bundle. PVCs placed at the time of His bundle refractoriness demonstrated no atrial preexcitation. Rapid ventricular pacing during tachycardia demonstrated a VAV conduction sequence. Additional rapid atrial pacing at a cycle length of 250 ms would readily terminate the tachycardia. During tachycardia VA interval was essentially 0. 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 A 7 French quadripolar ablation catheter was then maneuvered into the His bundle region and coronary sinus. The catheter was inserted through the right femoral vein. Mapping demonstrated a fairly large Koch's triangle. The ablation catheter was then manipulated in this region, and a total of 14 radiofrequency energy applications were delivered, very briefly. During radiofrequency energy application, particularly around site 6, there was accelerated junctional rhythm, followed by VA block. This finding strongly suggested that complete heart block was near, and the catheter was removed and radiofrequency energy discontinued. After a total of 14 radiofrequency energy applications, additional isoproterenol was infused, and additional rapid atrial pacing, and programmed atrial stimulation was carried out. There was no inducible SVT. The PR interval was less than the RR interval. The patient was observed for 30 minutes. No additional inducible arrhythmias were observed. The catheters were then removed, and the patient was returned to the recovery area for removal of her  sheaths.  Complications:  There were no immediate 22 complications  Results.  1. Baseline ECG. The baseline ECG demonstrates normal sinus rhythm with normal axis and intervals. There is no preexcitation. 2. Baseline intervals. The AH interval measured 78 ms, HV interval measured 50 ms, the QRS duration measured 100 ms, and the sinus node cycle length measured 1100 ms. 3. Rapid ventricular pacing. Rapid ventricular pacing was carried out from the right ventricle demonstrating VA block at a pacing cycle length of 400 ms. During rapid ventricular pacing, the atrial activation appeared midline and decremental. There was no inducible SVT or VT. 4. Program ventricular stimulation. Program ventricular stimulation was carried out from the right ventricle at a pacing cycle length of 500 ms. The S1-S2 interval was decreased from 440 ms down to 380 ms with a ventricular ERP was demonstrated. During program ventricular stimulation, the atrial activation sequence was midline and decremental. There is no inducible SVT. There is no inducible VT. 5. Rapid atrial pacing. Rapid atrial pacing was carried out from the atrium and pacing cycling of 600 ms and stepwise decreased down to 350 ms where AV block was observed. During rapid atrial pacing, on isoproterenol, the PR interval was greater than the RR interval and there was inducible SVT. 6. Programmed atrial stimulation. Programmed atrial stimulation was carried out from the atrium at a pacing cycle length of 500 and 400 ms. The S1-S2 interval was stepwise decrease down to atrial refractoriness. During programmed atrial stimulation, there are multiple AH jumps, and echo beats. There were double echo beats. There was no inducible SVT. 7. Arrhythmias observed. AV node reentrant tachycardia. Induction was with rapid atrial pacing on isoproterenol. Termination was with rapid atrial pacing, duration sustained. 8. Mapping. Mapping of Koch's triangle demonstrated a larger than  usual Koch's triangle.  9. radiofrequency energy application. 14 brief radiofrequency energy applications were delivered, resulting in accelerated junctional rhythm, and rendering the tachycardia noninducible.  Conclusion: Successful electrophysiology study and catheter ablation of AV node reentrant tachycardia. A total of 14 radiofrequency energy applications were delivered between site 4 and 8. Following ablation, there was no inducible SVT.  Cristopher Peru, M.D.

## 2013-08-01 ENCOUNTER — Other Ambulatory Visit: Payer: Self-pay

## 2013-08-01 DIAGNOSIS — I498 Other specified cardiac arrhythmias: Secondary | ICD-10-CM

## 2013-08-01 MED ORDER — ALPRAZOLAM 0.5 MG PO TABS
0.5000 mg | ORAL_TABLET | Freq: Once | ORAL | Status: AC
  Administered 2013-08-01: 0.5 mg via ORAL
  Filled 2013-08-01: qty 1
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
## 2013-08-01 NOTE — Discharge Instructions (Signed)
Cardiac Ablation  Cardiac ablation is a procedure to stop some heart tissue from causing problems. The heart has many electrical connections. Sometimes these connections cause the heart to beat very fast or irregularly. Removing some of the problem areas can improve heart rhythm or make it normal. Ablation is done for people who:   Have Wolff-Parkinson-White syndrome.  Have other fast heart rhythms (tachycardia).  Have taken medicines for an abnormal heart rhythm (arrhythmia) and the medicines had:  No success.  Side effects.  May have a type of heartbeat that could cause death. BEFORE THE PROCEDURE   Follow instructions from your doctor about eating and drinking before the procedure.  Take your medicines as told by your doctor. Take them at regular times with water unless told differently by your doctor.  If you are taking diabetes medicine, ask your doctor how to take it. Ask if there are any special instructions you should follow. Your doctor may change how much insulin you take the day of the procedure. PROCEDURE   A special type of X-ray will be used. The X-ray helps your doctor see images of your heart during the procedure.  A small cut (incision) will be made in your neck or groin.  An IV tube will be started before the procedure begins.  You will be given a numbing medicine (anesthetic) or a medicine to help you relax (sedative).  The skin on your neck or groin will be numbed.  A needle will be put into a large vein in your neck or groin.  A thin, flexible tube (catheter) will be put in to reach your heart.  A dye will be put in the tube. The dye will show up on X-rays. It will help your doctor see the area of the heart that needs treatment.  When the heart tissue that is causing problems is found, the tip of the tube will send an electrical current to it. This will stop it from causing problems.  The tube will be taken out.  Pressure will be put on the area where  the tube was. This will keep it from bleeding. A bandage will be placed over the area.  The procedure can take 1 6 hours to complete. AFTER THE PROCEDURE  You will be taken to a recovery area. Your blood pressure, heart rate, and breathing will be watched. The area where the tube was will also be watched for bleeding.  You will need to lie still for 4 6 hours. This keeps the area where the tube was from bleeding.  If your blood pressure, heart rate, and breathing are stable and if no bleeding occurs, you may go home.  If complications occur or your doctor feels you should be watched, you may need to stay in the hospital overnight. Document Released: 11/05/2012 Document Reviewed: 11/05/2012 [facility] Patient Information 2014 [place].  Cardiac Diet This diet can help prevent heart disease and stroke. Many factors influence your heart health, including eating and exercise habits. Coronary risk rises a lot with abnormal blood fat (lipid) levels. Cardiac meal planning includes limiting unhealthy fats, increasing healthy fats, and making other small dietary changes. General guidelines are as follows:  Adjust calorie intake to reach and maintain desirable body weight.  Limit total fat intake to less than 30% of total calories. Saturated fat should be less than 7% of calories.  Saturated fats are found in animal products and in some vegetable products. Saturated vegetable fats are found in coconut oil, cocoa  butter, palm oil, and palm kernel oil. Read labels carefully to avoid these products as much as possible. Use butter in moderation. Choose tub margarines and oils that have 2 grams of fat or less. Good cooking oils are canola and olive oils.  Practice low-fat cooking techniques. Do not fry food. Instead, broil, bake, boil, steam, grill, roast on a rack, stir-fry, or microwave it. Other fat reducing suggestions include:  Remove the skin from poultry.  Remove all visible fat from  meats.  Skim the fat off stews, soups, and gravies before serving them.  Steam vegetables in water or broth instead of sauting them in fat.  Avoid foods with trans fat (or hydrogenated oils), such as commercially fried foods and commercially baked goods. Commercial shortening and deep-frying fats will contain trans fat.  Increase intake of fruits, vegetables, whole grains, and legumes to replace foods high in fat.  Increase consumption of nuts, legumes, and seeds to at least 4 servings weekly. One serving of a legume equals  cup, and 1 serving of nuts or seeds equals  cup.  Choose whole grains more often. Have 3 servings per day (a serving is 1 ounce [oz]).  Eat 4 to 5 servings of vegetables per day. A serving of vegetables is 1 cup of raw leafy vegetables;  cup of raw or cooked cut-up vegetables;  cup of vegetable juice.  Eat 4 to 5 servings of fruit per day. A serving of fruit is 1 medium whole fruit;  cup of dried fruit;  cup of fresh, frozen, or canned fruit;  cup of 100% fruit juice.  Increase your intake of dietary fiber to 20 to 30 grams per day. Insoluble fiber may help lower your risk of heart disease and may help curb your appetite.  Soluble fiber binds cholesterol to be removed from the blood. Foods high in soluble fiber are dried beans, citrus fruits, oats, apples, bananas, broccoli, Brussels sprouts, and eggplant.  Try to include foods fortified with plant sterols or stanols, such as yogurt, breads, juices, or margarines. Choose several fortified foods to achieve a daily intake of 2 to 3 grams of plant sterols or stanols.  Foods with omega-3 fats can help reduce your risk of heart disease. Aim to have a 3.5 oz portion of fatty fish twice per week, such as salmon, mackerel, albacore tuna, sardines, lake trout, or herring. If you wish to take a fish oil supplement, choose one that contains 1 gram of both DHA and EPA.  Limit processed meats to 2 servings (3 oz portion)  weekly.  Limit the sodium in your diet to 1500 milligrams (mg) per day. If you have high blood pressure, talk to a registered dietitian about a DASH (Dietary Approaches to Stop Hypertension) eating plan.  Limit sweets and beverages with added sugar, such as soda, to no more than 5 servings per week. One serving is:   1 tablespoon sugar.  1 tablespoon jelly or jam.   cup sorbet.  1 cup lemonade.   cup regular soda. CHOOSING FOODS Starches  Allowed: Breads: All kinds (wheat, rye, raisin, white, oatmeal, [place], [place], and English muffin bread). Low-fat rolls: English muffins, frankfurter and hamburger buns, bagels, pita bread, tortillas (not fried). Pancakes, waffles, biscuits, and muffins made with recommended oil.  Avoid: Products made with saturated or trans fats, oils, or whole milk products. Butter rolls, cheese breads, croissants. Commercial doughnuts, muffins, sweet rolls, biscuits, waffles, pancakes, store-bought mixes. Crackers  Allowed: Low-fat crackers and snacks: Animal, graham, rye,  saltine (with recommended oil, no lard), oyster, and matzo crackers. Bread sticks, melba toast, rusks, flatbread, pretzels, and light popcorn.  Avoid: High-fat crackers: cheese crackers, butter crackers, and those made with coconut, palm oil, or trans fat (hydrogenated oils). Buttered popcorn. Cereals  Allowed: Hot or cold whole-grain cereals.  Avoid: Cereals containing coconut, hydrogenated vegetable fat, or animal fat. Potatoes / Pasta / Rice  Allowed: All kinds of potatoes, rice, and pasta (such as macaroni, spaghetti, and noodles).  Avoid: Pasta or rice prepared with cream sauce or high-fat cheese. Chow mein noodles, [place] fries. Vegetables  Allowed: All vegetables and vegetable juices.  Avoid: Fried vegetables. Vegetables in cream, butter, or high-fat cheese sauces. Limit coconut. Fruit in cream or custard. Protein  Allowed: Limit your intake of meat, seafood, and poultry  to no more than 6 oz (cooked weight) per day. All lean, well-trimmed beef, veal, pork, and lamb. All chicken and [place] without skin. All fish and shellfish. Wild game: wild duck, rabbit, pheasant, and venison. Egg whites or low-cholesterol egg substitutes may be used as desired. Meatless dishes: recipes with dried beans, peas, lentils, and tofu (soybean curd). Seeds and nuts: all seeds and most nuts.  Avoid: Prime grade and other heavily marbled and fatty meats, such as short ribs, spare ribs, rib eye roast or steak, frankfurters, sausage, bacon, and high-fat luncheon meats, mutton. Caviar. Commercially fried fish. Domestic duck, goose, venison sausage. Organ meats: liver, gizzard, heart, chitterlings, brains, kidney, sweetbreads. Dairy  Allowed: Low-fat cheeses: nonfat or low-fat cottage cheese (1% or 2% fat), cheeses made with part skim milk, such as mozzarella, farmers, string, or ricotta. (Cheeses should be labeled no more than 2 to 6 grams fat per oz.). Skim (or 1%) milk: liquid, powdered, or evaporated. Buttermilk made with low-fat milk. Drinks made with skim or low-fat milk or cocoa. Chocolate milk or cocoa made with skim or low-fat (1%) milk. Nonfat or low-fat yogurt.  Avoid: Whole milk cheeses, including colby, cheddar, mu[place], Monterey Jack, [place], [place], [place], American, Swiss, and blue. Creamed cottage cheese, cream cheese. Whole milk and whole milk products, including buttermilk or yogurt made from whole milk, drinks made from whole milk. Condensed milk, evaporated whole milk, and 2% milk. Soups and Combination Foods  Allowed: Low-fat low-sodium soups: broth, dehydrated soups, homemade broth, soups with the fat removed, homemade cream soups made with skim or low-fat milk. Low-fat spaghetti, lasagna, chili, and Spanish rice if low-fat ingredients and low-fat cooking techniques are used.  Avoid: Cream soups made with whole milk, cream, or high-fat cheese. All other soups. Desserts and  Sweets  Allowed: Sherbet, fruit ices, gelatins, meringues, and angel food cake. Homemade desserts with recommended fats, oils, and milk products. Jam, jelly, honey, marmalade, sugars, and syrups. Pure sugar candy, such as gum drops, hard candy, jelly beans, marshmallows, mints, and small amounts of dark chocolate.  Avoid: Commercially prepared cakes, pies, cookies, frosting, pudding, or mixes for these products. Desserts containing whole milk products, chocolate, coconut, lard, palm oil, or palm kernel oil. Ice cream or ice cream drinks. Candy that contains chocolate, coconut, butter, hydrogenated fat, or unknown ingredients. Buttered syrups. Fats and Oils  Allowed: Vegetable oils: safflower, sunflower, corn, soybean, cottonseed, sesame, canola, olive, or peanut. Non-hydrogenated margarines. Salad dressing or mayonnaise: homemade or commercial, made with a recommended oil. Low or nonfat salad dressing or mayonnaise.  Limit added fats and oils to 6 to 8 tsp per day (includes fats used in cooking, baking, salads, and spreads on bread). Remember to  count the "hidden fats" in foods.  Avoid: Solid fats and shortenings: butter, lard, salt pork, bacon drippings. Gravy containing meat fat, shortening, or suet. Cocoa butter, coconut. Coconut oil, palm oil, palm kernel oil, or hydrogenated oils: these ingredients are often used in bakery products, nondairy creamers, whipped toppings, candy, and commercially fried foods. Read labels carefully. Salad dressings made of unknown oils, sour cream, or cheese, such as blue cheese and Roquefort. Cream, all kinds: half-and-half, light, heavy, or whipping. Sour cream or cream cheese (even if "light" or low-fat). Nondairy cream substitutes: coffee creamers and sour cream substitutes made with palm, palm kernel, hydrogenated oils, or coconut oil. Beverages  Allowed: Coffee (regular or decaffeinated), tea. Diet carbonated beverages, mineral water. Alcohol: Check with your  caregiver. Moderation is recommended.  Avoid: Whole milk, regular sodas, and juice drinks with added sugar. Condiments  Allowed: All seasonings and condiments. Cocoa powder. "Cream" sauces made with recommended ingredients.  Avoid: Carob powder made with hydrogenated fats. SAMPLE MENU Breakfast   cup orange juice   cup oatmeal  1 slice toast  1 tsp margarine  1 cup skim milk Lunch  [place] sandwich with 2 oz [place], 2 slices bread  Lettuce and tomato slices  Fresh fruit  Carrot sticks  Coffee or tea Snack  Fresh fruit or low-fat crackers Dinner  3 oz lean ground beef  1 baked potato  1 tsp margarine   cup asparagus  Lettuce salad  1 tbs non-creamy dressing   cup peach slices  1 cup skim milk Document Released: 12/13/2007 Document Revised: 09/04/2011 Document Reviewed: 05/29/2011 [employer] Patient Information 2014 [place], [place].

## 2013-08-01 NOTE — Progress Notes (Signed)
Patient ID: JANI MORONTA, female   DOB: 02-19-1951, 63 y.o.   MRN: [id number]   Patient Name: [name] Date of Encounter: 08/01/2013     Active Problems:   Paroxysmal supraventricular tachycardia    SUBJECTIVE No chest pain, but feels tired and sore. A bit anxious as she has not had her extended xanax.  CURRENT MEDS . ALPRAZolam  1 mg Oral Q supper  . aspirin EC  81 mg Oral Daily  . estradiol  0.5 mg Oral Daily  . sodium chloride  3 mL Intravenous Q12H  . zolpidem  5 mg Oral QHS    OBJECTIVE  Filed Vitals:   07/31/13 1500 07/31/13 2048 08/01/13 0500 08/01/13 0723  BP: 101/65 102/57 88/45 110/73  Pulse:  49 51   Temp:  97.4 F (36.3 C) 97.7 F (36.5 C)   TempSrc:  Oral Oral   Resp:  16 16   Height:      Weight:   127 lb 14.4 oz (58.015 kg)   SpO2:  98% 96%     Intake/Output Summary (Last 24 hours) at 08/01/13 0900 Last data filed at 07/31/13 2200  Gross per 24 hour  Intake      0 ml  Output    700 ml  Net   -700 ml   Filed Weights   07/31/13 0808 08/01/13 0500  Weight: 120 lb (54.432 kg) 127 lb 14.4 oz (58.015 kg)    PHYSICAL EXAM  General: Pleasant, NAD. Neuro: Alert and oriented X 3. Moves all extremities spontaneously. Psych: Normal affect. HEENT:  Normal  Neck: Supple without bruits or JVD. Lungs:  Resp regular and unlabored, CTA. Heart: RRR no s3, s4, or murmurs. Abdomen: Soft, non-tender, non-distended, BS + x 4.  Extremities: No clubbing, cyanosis or edema. DP/PT/Radials 2+ and equal bilaterally.  Accessory Clinical Findings  CBC No results found for this basename: WBC, NEUTROABS, HGB, HCT, MCV, PLT,  in the last 72 hours Basic Metabolic Panel No results found for this basename: NA, K, CL, CO2, GLUCOSE, BUN, CREATININE, CALCIUM, MG, PHOS,  in the last 72 hours Liver Function Tests No results found for this basename: AST, ALT, ALKPHOS, BILITOT, PROT, ALBUMIN,  in the last 72 hours No results found for this basename: LIPASE, AMYLASE,  in  the last 72 hours Cardiac Enzymes No results found for this basename: CKTOTAL, CKMB, CKMBINDEX, TROPONINI,  in the last 72 hours BNP No components found with this basename: POCBNP,  D-Dimer No results found for this basename: DDIMER,  in the last 72 hours Hemoglobin A1C No results found for this basename: HGBA1C,  in the last 72 hours Fasting Lipid Panel No results found for this basename: CHOL, HDL, LDLCALC, TRIG, CHOLHDL, LDLDIRECT,  in the last 72 hours Thyroid Function Tests No results found for this basename: TSH, T4TOTAL, FREET3, T3FREE, THYROIDAB,  in the last 72 hours  TELE  nsr with rare PVC  ECG NSR  Radiology/Studies  No results found.  ASSESSMENT AND PLAN 1. 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 [facility] for discharge home. followup with me in 3-4 weeks. Stop metoprolol.  Gregg Taylor,M.D.  08/01/2013 9:00 AM

## 2013-08-13 ENCOUNTER — Institutional Professional Consult (permissible substitution): Payer: Commercial Managed Care - PPO | Admitting: Internal Medicine

## 2013-08-24 ENCOUNTER — Other Ambulatory Visit: Payer: Self-pay

## 2013-08-25 ENCOUNTER — Ambulatory Visit (INDEPENDENT_AMBULATORY_CARE_PROVIDER_SITE_OTHER): Payer: Commercial Managed Care - PPO | Admitting: Internal Medicine

## 2013-08-25 ENCOUNTER — Encounter: Payer: Self-pay | Admitting: Internal Medicine

## 2013-08-25 VITALS — BP 108/71 | HR 61 | Ht 64.5 in | Wt 125.0 lb

## 2013-08-25 DIAGNOSIS — I471 Supraventricular tachycardia: Secondary | ICD-10-CM

## 2013-08-25 DIAGNOSIS — I498 Other specified cardiac arrhythmias: Secondary | ICD-10-CM

## 2013-08-25 NOTE — Patient Instructions (Addendum)
Your physician recommends that you continue on your current medications as directed. Please refer to the Current Medication list given to you today.  Your physician wants you to follow-up in: 1 year with Dr. Taylor.  You will receive a reminder letter in the mail two months in advance. If you don't receive a letter, please call our office to schedule the follow-up appointment.  

## 2013-08-26 ENCOUNTER — Encounter: Payer: Self-pay | Admitting: Internal Medicine

## 2013-08-26 NOTE — Progress Notes (Signed)
HPI [name] returns today for followup. 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 She has done well in the interim. She notes one episode where it felt like her heart was racing lasting about 10 seconds. She has stopped her beta blocker. She has tried to wean herself off of Xanax but is still taking an excess amount.  No Known Allergies   Current Outpatient Prescriptions  Medication Sig Dispense Refill  . ALPRAZolam (XANAX XR) 1 MG 24 hr tablet Take 1 mg by mouth daily after supper.       . ALPRAZolam (XANAX) 0.5 MG tablet Take 0.5 mg by mouth 3 (three) times daily as needed (breakthrough anxiety).       Marland Kitchen aspirin EC 81 MG tablet Take 81 mg by mouth daily.      . Calcium Carb-Cholecalciferol (CALCIUM 600 + D PO) Take 1 tablet by mouth daily.      Marland Kitchen estradiol (ESTRACE) 1 MG tablet Take 0.5 mg by mouth daily.       . Eszopiclone (ESZOPICLONE) 3 MG TABS Take 3 mg by mouth at bedtime as needed (for sleep). Take immediately before bedtime      . Krill Oil Omega-3 300 MG CAPS Take 300 mg by mouth daily.      Vladimir Faster Glycol-Propyl Glycol (SYSTANE OP) Place 2 drops into both eyes at bedtime.       No current facility-administered medications for this visit.     Past Medical History  Diagnosis Date  . Dysthymic disorder   . Irritable bowel syndrome   . Esophageal reflux   . Personal history of colonic polyps 2002    hyperplastic  . Rotator cuff syndrome of shoulder and allied disorders   . Plantar fascial fibromatosis   . Other acquired calcaneus deformity   . Enthesopathy of ankle and tarsus, unspecified   . Bunion   . Family history of malignant neoplasm of gastrointestinal tract   . Narcotic dependence   . Anxiety   . Skin cancer   . Colon polyps   . Hiatal hernia   . Syncope   . Atrial flutter with rapid ventricular response   . Dysrhythmia 07/2013    SVT    ROS:   All  systems reviewed and negative except as noted in the HPI.   Past Surgical History  Procedure Laterality Date  . Bunionectomy    . Arthroplasty  2006    thumb  . Partial hysterectomy      Still has ovaries  . Bravo ph study  02/01/2011    Procedure: BRAVO [place] STUDY;  Surgeon: Inda Castle, MD;  Location: [facility];  Service: Endoscopy;  Laterality: N/A;  . Ablation of dysrhythmic focus  07/31/2013    DR Lovena Le  . Cesarean section    . Placement of breast implants       Family History  Problem Relation Age of Onset  . Colon cancer Father 29  . Breast cancer Maternal Aunt   . Ovarian cancer Cousin      History   Social History  . Marital Status: Divorced    Spouse Name: N/A    Number of Children: 3  . 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 Smoking status: Former Audiological scientist  date: 03/19/1978  . Smokeless tobacco: Never Used  . Alcohol Use: No     Comment: rarely  . Drug Use: No  . Sexual Activity: Not on file   Other Topics Concern  . Not on file   Social History Narrative  . No narrative on file     BP 108/71  Pulse 61  Ht 5' 4.5" (1.638 m)  Wt 125 lb (56.7 kg)  BMI 21.13 kg/m2  Physical Exam:  Well appearing middle aged woman, looking younger than her stated age, NAD HEENT: Unremarkable Neck:  No JVD, no thyromegally Back:  No CVA tenderness Lungs:  Clear with no wheezes HEART:  Regular rate rhythm, no murmurs, no rubs, no clicks Abd:  soft, positive bowel sounds, no organomegally, no rebound, no guarding Ext:  2 plus pulses, no edema, no cyanosis, no clubbing Skin:  No rashes no nodules Neuro:  CN II through XII intact, motor grossly intact  EKG NSR  Assess/Plan:

## 2013-08-26 NOTE — Assessment & Plan Note (Signed)
She is s/p ablation. No evidence of recurrence. She will undergo watchful waiting.

## 2014-01-14 ENCOUNTER — Ambulatory Visit (INDEPENDENT_AMBULATORY_CARE_PROVIDER_SITE_OTHER): Payer: Commercial Managed Care - PPO | Admitting: Nurse Practitioner

## 2014-01-14 ENCOUNTER — Telehealth: Payer: Self-pay | Admitting: Gastroenterology

## 2014-01-14 ENCOUNTER — Ambulatory Visit (INDEPENDENT_AMBULATORY_CARE_PROVIDER_SITE_OTHER)
Admission: RE | Admit: 2014-01-14 | Discharge: 2014-01-14 | Disposition: A | Discharge status: Home / Self Care | Payer: Commercial Managed Care - PPO | Source: Ambulatory Visit | Attending: Nurse Practitioner | Admitting: Nurse Practitioner

## 2014-01-14 ENCOUNTER — Encounter: Payer: Self-pay | Admitting: Nurse Practitioner

## 2014-01-14 ENCOUNTER — Other Ambulatory Visit (INDEPENDENT_AMBULATORY_CARE_PROVIDER_SITE_OTHER): Payer: Commercial Managed Care - PPO

## 2014-01-14 VITALS — BP 120/80 | HR 60 | Ht 64.5 in | Wt 126.2 lb

## 2014-01-14 DIAGNOSIS — R1030 Lower abdominal pain, unspecified: Secondary | ICD-10-CM

## 2014-01-14 DIAGNOSIS — R1084 Generalized abdominal pain: Secondary | ICD-10-CM

## 2014-01-14 LAB — URINALYSIS
Bilirubin Urine: NEGATIVE
Hgb urine dipstick: NEGATIVE
Ketones, ur: NEGATIVE
LEUKOCYTES UA: NEGATIVE
NITRITE: NEGATIVE
TOTAL PROTEIN, URINE-UPE24: NEGATIVE
Urine Glucose: NEGATIVE
Urobilinogen, UA: 0.2 (ref 0.0–1.0)
pH: 6.5 (ref 5.0–8.0)

## 2014-01-14 LAB — BASIC METABOLIC PANEL
BUN: 10 mg/dL (ref 6–23)
CHLORIDE: 104 meq/L (ref 96–112)
CO2: 29 meq/L (ref 19–32)
CREATININE: 0.9 mg/dL (ref 0.4–1.2)
Calcium: 9.4 mg/dL (ref 8.4–10.5)
GFR: 69.36 mL/min (ref >60.00)
GLUCOSE: 101 mg/dL — AB (ref 70–99)
Potassium: 4.2 mEq/L (ref 3.5–5.1)
Sodium: 138 mEq/L (ref 135–145)

## 2014-01-14 LAB — CBC WITH DIFFERENTIAL/PLATELET
BASOS PCT: 0.8 % (ref 0.0–3.0)
Basophils Absolute: 0 10*3/uL (ref 0.0–0.1)
Eosinophils Absolute: 0.1 10*3/uL (ref 0.0–0.7)
Eosinophils Relative: 1.3 % (ref 0.0–5.0)
HEMATOCRIT: 40.8 % (ref 36.0–46.0)
HEMOGLOBIN: 13.6 g/dL (ref 12.0–15.0)
LYMPHS ABS: 1.8 10*3/uL (ref 0.7–4.0)
LYMPHS PCT: 37.1 % (ref 12.0–46.0)
MCHC: 33.3 g/dL (ref 30.0–36.0)
MCV: 93.2 fl (ref 78.0–100.0)
MONOS PCT: 9.7 % (ref 3.0–12.0)
Monocytes Absolute: 0.5 10*3/uL (ref 0.1–1.0)
NEUTROS ABS: 2.5 10*3/uL (ref 1.4–7.7)
Neutrophils Relative %: 51.1 % (ref 43.0–77.0)
Platelets: 284 10*3/uL (ref 150.0–400.0)
RBC: 4.38 Mil/uL (ref 3.87–5.11)
RDW: 13.5 % (ref 11.5–15.5)
WBC: 4.8 10*3/uL (ref 4.0–10.5)

## 2014-01-14 MED ORDER — OXYCODONE-ACETAMINOPHEN 5-325 MG PO TABS: 1.0000 | ORAL_TABLET | Freq: Four times a day (QID) | ORAL | Status: DC | PRN

## 2014-01-14 NOTE — Progress Notes (Signed)
     History of Present Illness:   Patient is a 63 year old female, former patient of Dr. Buel Ream followed for GERD/ esophageal dysmotility and a [facility] of colon cancer. She is up-to-date on colon cancer screening. Last colonoscopy April 2014 was normal. She is due again in April 2019.  Patient is worked in today for evaluation of acute abdominal pain which began 2 days ago. Patient initially felt this was gas pain but it has been constant since 1 AM Wednesday. She has tried Research officer, political party. Curling up on her left side helps to some degree. Pain is not related to eating.  Bowel movements are unchanged. Patient takes something for constipation almost on a daily basis. No urinary problems.  Current Medications, Allergies, Past Medical History, Past Surgical History, Family History and Social History were reviewed in [employer] record.  Physical Exam: General: Pleasant, well developed , white female in no acute distress Head: Normocephalic and atraumatic Eyes:  sclerae anicteric, conjunctiva pink  Ears: Normal auditory acuity Lungs: Clear throughout to auscultation Heart: Regular rate and rhythm Abdomen: Soft, non minimally distended, tympanitic. Mild diffuse mid lower tenderness. Normal bowel sounds Musculoskeletal: Symmetrical with no gross deformities  Extremities: No edema  Neurological: Alert oriented x 4, grossly nonfocal Psychological:  Alert and cooperative. Normal mood and affect Assessment and Recommendations:   77 -year-old female with a 2 day history of nearly constant lower abdominal pain. Abdominal exam is unremarkable except for mild distention / tympany.  Will check u/a, labs and flat /upright (?constipation). Will call with results and if unremarkable and pain persists then consider CTscan. Will give percocet one Q 6 hours prn #20 to take while sorting things out

## 2014-01-14 NOTE — Patient Instructions (Signed)
Please go to the basement level to have your labs drawn and urinalysis test. You also need to go to our radiology department, basement level for an X-Ray.   We have given you a prescription for pain to take to your pharmacy.   You have been scheduled for a CT scan of the abdomen and pelvis at [facility] ([address]---this is in the same building as [facility]).   You are scheduled on Friday, 01-15-2014 at 1:30 PM  at 1:15 PM . You should arrive 15 minutes prior to your appointment time for registration. Please follow the written instructions below on the day of your exam:  WARNING: IF YOU ARE ALLERGIC TO IODINE/X-RAY DYE, PLEASE NOTIFY RADIOLOGY IMMEDIATELY AT [phone]! YOU WILL BE GIVEN A 13 HOUR PREMEDICATION PREP.  1) Do not eat or drink anything after 9:30 am  (4 hours prior to your test) 2) You have been given 2 bottles of oral contrast to drink. The solution may taste better if refrigerated, but do NOT add ice or any other liquid to this solution. Shake well before drinking.    Drink 1 bottle of contrast @ 11:30 am  (2 hours prior to your exam)  Drink 1 bottle of contrast @ 12:30 PM  (1 hour prior to your exam)  You may take any medications as prescribed with a small amount of water except for the following: Metformin, Glucophage, Glucovance, Avandamet, Riomet, Fortamet, Actoplus Met, Janumet, Glumetza or Metaglip. The above medications must be held the day of the exam AND 48 hours after the exam.  The purpose of you drinking the oral contrast is to aid in the visualization of your intestinal tract. The contrast solution may cause some diarrhea. Before your exam is started, you will be given a small amount of fluid to drink. Depending on your individual set of symptoms, you may also receive an intravenous injection of x-ray contrast/dye. Plan on being at [facility] for 30 minutes or long, depending on the type of exam you are having performed.  If  you have any questions regarding your exam or if you need to reschedule, you may call the CT department at [phone] between the hours of 8:00 am and 5:00 pm, Monday-Friday.  ________________________________________________________________________

## 2014-01-14 NOTE — Telephone Encounter (Signed)
Patient states she woke up 2 days ago with lower abdominal pain. Yesterday, she felt a little better. She went to work today and got worse. She cannot stand up straight. Feels like a gas pain but does not let up. She tried Gas EX and Tylenol without relief. She has a bowel movement today. She is on a bland/liquid diet. Scheduled with Tye Savoy, NP today at 2:30 PM.

## 2014-01-15 ENCOUNTER — Inpatient Hospital Stay: Admission: RE | Admit: 2014-01-15 | Payer: Commercial Managed Care - PPO | Source: Ambulatory Visit

## 2014-01-15 ENCOUNTER — Encounter: Payer: Self-pay | Admitting: Nurse Practitioner

## 2014-01-15 ENCOUNTER — Telehealth: Payer: Self-pay | Admitting: *Deleted

## 2014-01-15 DIAGNOSIS — R1030 Lower abdominal pain, unspecified: Secondary | ICD-10-CM | POA: Insufficient documentation

## 2014-01-15 NOTE — Telephone Encounter (Signed)
Message copied by Tonette Bihari on Fri Jan 15, 2014  9:09 AM ------      Message from: Willia Craze      Created: Thu Jan 14, 2014 11:44 PM       Ardena Gangl, please call patient. I think we can hold off on CTscan. She has a large amount of stool in distal colon causing some bowel distention. This is probably cause of pain. She needs to take a couple of fleets enemas ------

## 2014-01-15 NOTE — Telephone Encounter (Signed)
I called and advised the patient that we are going to cancel the CT Scan. The x-ray showed a lot of stool in the distal colon.  [name] advises she uses severl fleet enemas to get rid of the stool.  I suggested to get Miralax also which she does have some at home.  She is taking pain medication also which constipated her.  I also called and Left a message for Lattie Haw @ [facility] to cancel the patient's CT Scan.

## 2014-01-19 ENCOUNTER — Telehealth: Payer: Self-pay | Admitting: Nurse Practitioner

## 2014-01-19 NOTE — Telephone Encounter (Signed)
Yes, Metamucil and Bran is fine with the Miralax. Since patient still doesn't feel like bowels emptying well she can add fleets enema daily x 2 days. She really needs to have bowels moving before appointment on 11th. Otherwise Dr. Fuller Plan will still wonder about whether constipation contributing to pain. If absolutely not constipated but still having pain then she will probably need CTscan. Thanks

## 2014-01-19 NOTE — Telephone Encounter (Signed)
Spoke with patient and gave her recommendations. She states she cannot do Fleets enema's. She will increase her Miralax dose to BID.

## 2014-01-19 NOTE — Progress Notes (Signed)
Reviewed and agree with management. Barre Aydelott D. Shirley Bolle, M.D., FACG  

## 2014-01-19 NOTE — Telephone Encounter (Signed)
Spoke with patient and she states she is still having abdominal pain. It is there all the time but is worse at times. It woke her up last night. She is taking Miralax daily and is having bowel movements. She feels she is not emptying her bowels. Stool is not hard. Reports she has abdominal pain when she has a bowel movement. She is scheduled to see Dr. Fuller Plan on 01/27/14. She is not sure if she should be taking her Metamucil and Bran with the Miralax. Please, advise.

## 2014-01-27 ENCOUNTER — Ambulatory Visit (INDEPENDENT_AMBULATORY_CARE_PROVIDER_SITE_OTHER): Payer: Commercial Managed Care - PPO | Admitting: Gastroenterology

## 2014-01-27 ENCOUNTER — Encounter: Payer: Self-pay | Admitting: Gastroenterology

## 2014-01-27 VITALS — BP 116/72 | HR 64 | Ht 64.5 in | Wt 123.2 lb

## 2014-01-27 DIAGNOSIS — K5900 Constipation, unspecified: Secondary | ICD-10-CM

## 2014-01-27 DIAGNOSIS — R1030 Lower abdominal pain, unspecified: Secondary | ICD-10-CM

## 2014-01-27 DIAGNOSIS — R634 Abnormal weight loss: Secondary | ICD-10-CM

## 2014-01-27 MED ORDER — LINACLOTIDE 290 MCG PO CAPS: 290.0000 ug | ORAL_CAPSULE | Freq: Every day | ORAL | Status: DC

## 2014-01-27 NOTE — Patient Instructions (Addendum)
We have sent medications to your pharmacy for you to pick up at your convenience. We will get your most recent lab from Dr Harrington Challenger. You have been scheduled for a CT scan of the abdomen and pelvis at [facility] ([address]---this is in the same building as [facility]).   You are scheduled on 01/28/14 at 2 pm . You should arrive 15 minutes prior to your appointment time for registration. Please follow the written instructions below on the day of your exam:  WARNING: IF YOU ARE ALLERGIC TO IODINE/X-RAY DYE, PLEASE NOTIFY RADIOLOGY IMMEDIATELY AT [phone]! YOU WILL BE GIVEN A 13 HOUR PREMEDICATION PREP.  1) Do not eat or drink anything after 10 am (4 hours prior to your test) 2) You have been given 2 bottles of oral contrast to drink. The solution may taste better if refrigerated, but do NOT add ice or any other liquid to this solution. Shake             well before drinking.    Drink 1 bottle of contrast @ 12 noon  (2 hours prior to your exam)  Drink 1 bottle of contrast @ 1 pm (1 hour prior to your exam)  You may take any medications as prescribed with a small amount of water except for the following: Metformin, Glucophage, Glucovance, Avandamet, Riomet, Fortamet, Actoplus Met, Janumet, Glumetza or Metaglip. The above medications must be held the day of the exam AND 48 hours after the exam.  The purpose of you drinking the oral contrast is to aid in the visualization of your intestinal tract. The contrast solution may cause some diarrhea. Before your exam is started, you will be given a small amount of fluid to drink. Depending on your individual set of symptoms, you may also receive an intravenous injection of x-ray contrast/dye. Plan on being at [facility] for 30 minutes or long, depending on the type of exam you are having performed.  This test typically takes 30-45 minutes to complete.  If you have any questions regarding your exam or if you need to  reschedule, you may call the CT department at [phone] between the hours of 8:00 am and 5:00 pm, Monday-Friday.  Follow on 02/24/14 at 10:45 with Dr Fuller Plan. Call if the Linzess is not working. CC:  Josetta Huddle MD

## 2014-01-27 NOTE — Progress Notes (Signed)
    History of Present Illness: This is a 63 year old female formerly followed by Dr. Verl Blalock. She was seen by Tye Savoy NP 3 weeks ago for lower abdominal pain. Abdominal films confirmed constipation. She's noted a mild weight loss over the past few weeks. Occasionally her pain is worsened by meals and occasionally her pain is worsened with bowel movements. She has a long history of constipation difficulty in fully evacuating stools and has used fiber supplements for many years with good results. MiraLAX was added to her regimen which made her stools softer but did not help her difficulty with evacuation or lower abdominal pain. Her father had mesenteric ischemia.  Current Medications, Allergies, Past Medical History, Past Surgical History, Family History and Social History were reviewed in [employer] record.  Physical Exam: General: Well developed , well nourished, no acute distress Head: Normocephalic and atraumatic Eyes:  sclerae anicteric, EOMI Ears: Normal auditory acuity Mouth: No deformity or lesions Lungs: Clear throughout to auscultation Heart: Regular rate and rhythm; no murmurs, rubs or bruits Abdomen: Soft, mild lower abdominal tenderness to deep palpation without rebound or guardingand non distended. No masses, hepatosplenomegaly or hernias noted. Normal Bowel sounds Musculoskeletal: Symmetrical with no gross deformities  Pulses:  Normal pulses noted Extremities: No clubbing, cyanosis, edema or deformities noted Neurological: Alert oriented x 4, grossly nonfocal Psychological:  Alert and cooperative. Normal mood and affect  Assessment and Recommendations:  1. Lower abdominal pain, weight loss, constipation. Rule out mesenteric ischemia. Symptoms may be secondary to constipation with inadequate evacuation. Advised to minimize or avoid opioid medications. Schedule abdominal and pelvic CT with CTA. Begin Linzess 290 g daily. Return office visit  in 4 weeks.

## 2014-01-28 ENCOUNTER — Ambulatory Visit (INDEPENDENT_AMBULATORY_CARE_PROVIDER_SITE_OTHER)
Admission: RE | Admit: 2014-01-28 | Discharge: 2014-01-28 | Disposition: A | Discharge status: Home / Self Care | Payer: Commercial Managed Care - PPO | Source: Ambulatory Visit | Attending: Gastroenterology | Admitting: Gastroenterology

## 2014-01-28 DIAGNOSIS — R1030 Lower abdominal pain, unspecified: Secondary | ICD-10-CM

## 2014-01-28 DIAGNOSIS — K5900 Constipation, unspecified: Secondary | ICD-10-CM

## 2014-01-28 MED ORDER — IOHEXOL 350 MG/ML SOLN
100.0000 mL | Freq: Once | INTRAVENOUS | Status: AC | PRN
  Administered 2014-01-28: 100 mL via INTRAVENOUS

## 2014-02-24 ENCOUNTER — Ambulatory Visit: Payer: Commercial Managed Care - PPO | Admitting: Gastroenterology

## 2014-02-25 ENCOUNTER — Encounter (HOSPITAL_COMMUNITY): Payer: Self-pay | Admitting: Internal Medicine

## 2014-03-17 ENCOUNTER — Ambulatory Visit: Payer: Commercial Managed Care - PPO | Admitting: Gastroenterology

## 2014-04-12 ENCOUNTER — Telehealth: Payer: Self-pay | Admitting: Internal Medicine

## 2014-04-12 NOTE — Telephone Encounter (Signed)
Will bring in to see Dr Lovena Le tomorrow

## 2014-04-12 NOTE — Telephone Encounter (Signed)
New message    Patient c/o Palpitations:  High priority if patient c/o lightheadedness and shortness of breath.  1. How long have you been having palpitations? Tachycardia - started on Saturday - also with headache  Last for 10 min   2. Are you currently experiencing lightheadedness and shortness of breath? No   3. Have you checked your BP and heart rate? (document readings) No   4. Are you experiencing any other symptoms? No

## 2014-04-13 ENCOUNTER — Encounter: Payer: Self-pay | Admitting: Internal Medicine

## 2014-04-13 ENCOUNTER — Ambulatory Visit (INDEPENDENT_AMBULATORY_CARE_PROVIDER_SITE_OTHER): Payer: Commercial Managed Care - PPO | Admitting: Internal Medicine

## 2014-04-13 VITALS — BP 112/82 | HR 59 | Ht 64.5 in | Wt 126.0 lb

## 2014-04-13 DIAGNOSIS — I471 Supraventricular tachycardia: Secondary | ICD-10-CM

## 2014-04-13 DIAGNOSIS — R0609 Other forms of dyspnea: Secondary | ICD-10-CM

## 2014-04-13 NOTE — Progress Notes (Signed)
HPI Ms. Mealor returns today for followup. 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 She has done well in the interim. She notes one episode where it felt like her heart was racing lasting several minutes. She characterized this spell has fast and irregular. She has stopped her beta blocker. She has tried to wean herself off of Xanax but is still taking an excess amount. She has not had syncope. She notes rare dizzy spells. She is still exercising vigorously but does not think she is in quite as good a shape as she should be in. She states that her 20 yo mother has atrial fib. No Known Allergies   Current Outpatient Prescriptions  Medication Sig Dispense Refill  . ALPRAZolam (XANAX) 0.5 MG tablet Take 0.5 mg by mouth 3 (three) times daily as needed (breakthrough anxiety).     . ALPRAZOLAM XR 1 MG 24 hr tablet Take 1 tablet by mouth 2 (two) times daily. Takes along with a 0.5 mg tablet (only as needed)    . aspirin EC 81 MG tablet Take 81 mg by mouth daily.    . Calcium Carbonate-Vit D-Min (CALCIUM 600 + MINERALS PO) Take 1 capsule by mouth daily.    Marland Kitchen estradiol (ESTRACE) 1 MG tablet Take 0.5 mg by mouth daily.     . Eszopiclone (ESZOPICLONE) 3 MG TABS Take 3 mg by mouth at bedtime as needed (for sleep). Take immediately before bedtime    . Polyethyl Glycol-Propyl Glycol (SYSTANE OP) Place 2 drops into both eyes at bedtime.    . traMADol (ULTRAM) 50 MG tablet Take 1 tablet by mouth as directed. Can take up to 2-3 times a day    . valACYclovir (VALTREX) 1000 MG tablet Take 0.5 tablets by mouth 2 (two) times daily as needed. Breakouts  0   No current facility-administered medications for this visit.     Past Medical History  Diagnosis Date  . Dysthymic disorder   . Irritable bowel syndrome   . Esophageal reflux   . Personal history of colonic polyps 2002    hyperplastic  . Rotator cuff  syndrome of shoulder and allied disorders   . Plantar fascial fibromatosis   . Other acquired calcaneus deformity   . Enthesopathy of ankle and tarsus, unspecified   . Bunion   . Family history of malignant neoplasm of gastrointestinal tract   . Narcotic dependence   . Anxiety   . Skin cancer   . Colon polyps   . Hiatal hernia   . Syncope   . Atrial flutter with rapid ventricular response   . Dysrhythmia 07/2013    SVT    ROS:   All systems reviewed and negative except as noted in the HPI.   Past Surgical History  Procedure Laterality Date  . Bunionectomy    . Arthroplasty  2006    thumb  . Partial hysterectomy      Still has ovaries  . Bravo ph study  02/01/2011    Procedure: BRAVO [place] STUDY;  Surgeon: Inda Castle, MD;  Location: [facility];  Service: Endoscopy;  Laterality: N/A;  . Ablation of dysrhythmic focus  07/31/2013    DR Lovena Le  . Cesarean section    . Placement of breast implants    . Colonoscopy  07/16/2012  . Esophagogastroduodenoscopy  02/01/2011  . Supraventricular tachycardia ablation N/A 07/31/2013  Procedure: SUPRAVENTRICULAR TACHYCARDIA ABLATION;  Surgeon: Evans Lance, MD;  Location: [facility] CATH LAB;  Service: Cardiovascular;  Laterality: N/A;     Family History  Problem Relation Age of Onset  . Colon cancer Father 66  . Breast cancer Maternal Aunt   . Ovarian cancer Cousin      History   Social History  . Marital Status: Divorced    Spouse Name: N/A    Number of Children: 3  . 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 Smoking status: Former Smoker    Quit date: 03/19/1978  . Smokeless tobacco: Never Used  . Alcohol Use: No     Comment: rarely  . Drug Use: No  . Sexual Activity: Not on file   Other Topics Concern  . Not on file   Social History Narrative     BP 112/82 mmHg  Pulse 59  Ht 5' 4.5" (1.638 m)  Wt 126 lb (57.153 kg)  BMI 21.30 kg/m2  Physical  Exam:  Well appearing middle aged woman, looking younger than her stated age, NAD HEENT: Unremarkable Neck:  No JVD, no thyromegally Back:  No CVA tenderness Lungs:  Clear with no wheezes HEART:  Regular rate rhythm, no murmurs, no rubs, no clicks Abd:  soft, positive bowel sounds, no organomegally, no rebound, no guarding Ext:  2 plus pulses, no edema, no cyanosis, no clubbing Skin:  No rashes no nodules Neuro:  CN II through XII intact, motor grossly intact  EKG NSR with a rare PVC  ECHO - I have reviewed her study from 9 months ago.   Assess/Plan:

## 2014-04-13 NOTE — Assessment & Plan Note (Signed)
I considered asking the patient to undergo another stress test. However, she is exercising vigorously. I suspect an exercise test would be very low yield. She will undergo watchful waiting. I have encouraged her to continue exercising.

## 2014-04-13 NOTE — Patient Instructions (Signed)
Your physician recommends that you schedule a follow-up appointment as needed with Dr Taylor  

## 2014-04-13 NOTE — Assessment & Plan Note (Signed)
I strongly doubt that she is having recurrent SVT. However, she might be having atrial fib. Her mother has it. As she has only had a single episode, I have recommended she undergo watchful waiting. If her symptoms recur, I would ask her to wear another cardiac monitor.

## 2014-05-10 ENCOUNTER — Other Ambulatory Visit: Payer: Self-pay

## 2014-05-10 DIAGNOSIS — Z9882 Breast implant status: Secondary | ICD-10-CM

## 2014-05-10 DIAGNOSIS — Z1231 Encounter for screening mammogram for malignant neoplasm of breast: Secondary | ICD-10-CM

## 2014-06-17 ENCOUNTER — Ambulatory Visit
Admission: RE | Admit: 2014-06-17 | Discharge: 2014-06-17 | Disposition: A | Discharge status: Home / Self Care | Payer: Commercial Managed Care - PPO | Source: Ambulatory Visit

## 2014-06-17 DIAGNOSIS — Z9882 Breast implant status: Secondary | ICD-10-CM

## 2014-06-17 DIAGNOSIS — Z1231 Encounter for screening mammogram for malignant neoplasm of breast: Secondary | ICD-10-CM

## 2014-08-18 ENCOUNTER — Ambulatory Visit (INDEPENDENT_AMBULATORY_CARE_PROVIDER_SITE_OTHER): Payer: Commercial Managed Care - PPO | Admitting: Sports Medicine

## 2014-08-18 ENCOUNTER — Encounter: Payer: Self-pay | Admitting: Sports Medicine

## 2014-08-18 VITALS — BP 127/82 | HR 54 | Ht 64.0 in | Wt 122.0 lb

## 2014-08-18 DIAGNOSIS — M216X2 Other acquired deformities of left foot: Secondary | ICD-10-CM | POA: Diagnosis not present

## 2014-08-18 NOTE — Progress Notes (Signed)
   Subjective:    Patient ID: [name], female    DOB: Sep 08, 1950, 64 y.o.   MRN: [id number]  HPI [name] is a 64 year old female who presents for evaluation of left foot pain. She was last seen approximately 10 years ago and has had custom orthotics crafted for her with bilateral metatarsal pads. She says that this has been helping for this entire time until approximately 2 weeks ago. She denies any acute injury or change in activity level. Location of pain is primarily on the plantar surface of the left foot over the second metatarsal head. She denies noticing any bruising or swelling. She remains active going to the gym most days of the week, doing cross Training. She denies any recent change in her mileage or change in type of running shoe. She denies any numbness or tingling. History is also significant for bilateral bunion surgery many years ago. She has a family history of bunions. She has not noted any relieving factors or tried anything for this problem.  Past medical history, social history, medications, and allergies were reviewed and are up to date in the chart.  Review of Systems 7 point review of systems was performed and was otherwise negative unless noted in the history of present illness.     Objective:   Physical Exam BP 127/82 mmHg  Pulse 54  Ht 5\' 4"  (1.626 m)  Wt 122 lb (55.339 kg)  BMI 20.93 kg/m2 GEN: The patient is well-developed well-nourished female and in no acute distress.  She is awake alert and oriented x3. SKIN: warm and well-perfused, no rash  EXTR: No lower extremity edema or calf tenderness Neuro: Strength 5/5 globally. Sensation intact throughout. DTRs 2/4 bilaterally. No focal deficits. Vasc: +2 bilateral distal pulses. No edema.  MSK: Examination of the bilateral feet with weightbearing reveals splaying of the third through fifth toes bilaterally with loss of transverse arch. She has fairly well-preserved bilateral medial longitudinal arches. Gait  analysis reveals that she slightly turns the right foot as well as has hyperpronation at the left ankle. Examination of the bilateral ankles reveals full range of motion without pain or swelling. Negative metatarsal squeeze test. Moderately tender to palpation in an area around the second metatarsal head. No point bony tenderness, bruising, or localized swelling. No palpable masses in the interdigital web spaces.  Patient was fitted for a : standard, cushioned, semi-rigid orthotic.  The orthotic was heated and afterward the patient stood on the orthotic blank positioned on the orthotic stand.  The patient was positioned in subtalar neutral position and 10 degrees of ankle dorsiflexion in a weight bearing stance.  After completion of molding, a stable base was applied to the orthotic blank.  The blank was ground to a stable position for weight bearing.  Size: 10 Base: EVA Posting: bilateral 1st ray posts Additional orthotic padding: bilateral MT pads  Greater than 50% of this 45 minute visit was spent face-to-face with the patient spent crafting, modifying, and testing the custom orthotics.     Assessment & Plan:  Please see problem based assessment and plan in the problem list.

## 2014-08-18 NOTE — Assessment & Plan Note (Addendum)
Dropping of left second metatarsal head. Low suspicion for metatarsal stress fracture or Morton's neuroma. -Crafted new custom orthotics today, refurbished previous orthotics. Patient reported improvement in symptoms following these modifications. I also added bilateral first ray posting to her new and previous orthotics. Bilateral metatarsal pads were replaced and applied. -Plan follow-up for orthotic modification if needed. Greater than 50% of this 45 minute visit was spent face-to-face with the patient spent crafting, modifying, and testing the custom orthotics.

## 2014-08-23 ENCOUNTER — Encounter: Payer: Self-pay | Admitting: Internal Medicine

## 2014-09-10 ENCOUNTER — Encounter: Payer: Self-pay | Admitting: Sports Medicine

## 2014-09-10 ENCOUNTER — Ambulatory Visit (INDEPENDENT_AMBULATORY_CARE_PROVIDER_SITE_OTHER): Payer: Commercial Managed Care - PPO | Admitting: Sports Medicine

## 2014-09-10 VITALS — BP 108/52 | HR 50 | Ht 64.0 in | Wt 122.0 lb

## 2014-09-10 DIAGNOSIS — G5702 Lesion of sciatic nerve, left lower limb: Secondary | ICD-10-CM

## 2014-09-10 DIAGNOSIS — M779 Enthesopathy, unspecified: Secondary | ICD-10-CM

## 2014-09-10 DIAGNOSIS — M7750 Other enthesopathy of unspecified foot: Secondary | ICD-10-CM

## 2014-09-10 NOTE — Assessment & Plan Note (Signed)
-  Rest, ice, tennis ball rolls, NSAID prn -HEP demonstrated for piriformis and HS -If she is pain-free in 2 weeks, may re-introduce stair machine at the gym in the custom orthotics, stop if pain >3. -Follow-up in 3-4 weeks or sooner prn

## 2014-09-10 NOTE — Assessment & Plan Note (Addendum)
[place] with mild soft tissue inflammation, hypoechoic change at left 2nd metatarsal on plantar surface. No cortical disruption. Potentially early stress reaction.  -Modify activities -Rest, ice, NSAID prn -Metatarsal arch strap fitted and applied today -She will avoid impact activities -If she is pain-free in 2 weeks, may re-introduce stair machine at the gym in the custom orthotics, stop if pain >3. -Follow-up in 3-4 weeks or sooner prn

## 2014-09-10 NOTE — Patient Instructions (Signed)
Plan for wearing metatarsal arch strap when not in orthotics (at the beach) Ice, rest, elevate Anti-inflammatory if needed  Rehabilitation exercises for your gluteal pain (I think this is somewhat both related to hamstring and piriformis). -Laying on your back, cross left leg over right. Pull knees towards your chest, hold 5 seconds, relax, repeat 5 times daily -Laying on your back grab your hands behind your left knee with hip bent to 90 degrees, gently extend your knee, trying to push your foot to the ceiling, stop when you feel a gentle pull, hold 5 seconds, relax, repeat 5 times once daily -On all 4's, extend right arm forward and left leg back to do the "Superman", alternate sides, 10 x 3 sets each. Ankle weights can be added when it's improved.  Follow-up in 3-4 weeks if needed, call sooner if needed.

## 2014-09-10 NOTE — Progress Notes (Signed)
   Subjective:    Patient ID: [name], female    DOB: [birth year], 64 y.o.   MRN: [id number]  HPI [name] is a 64 year old female who presents for evaluation of left foot pain and left hip pain. She was last seen 3 weeks ago where custom orthotics for crafted for her. She says that she feels great in the custom orthotics, but when she wears a sedative heels her left foot pain worsens. Location of pain is around the second metatarsal head. She denies any acute injury. She is very active at the gym, working out almost every day. She is not currently running. She denies noticing any bruising. She thinks that the bottom of her foot was slightly swollen. Symptoms are worse with going up stairs. Additionally, she notices some left posterior buttock pain. She denies any acute injury. She denies any low back or radicular symptoms. Denies numbness or tingling. She has tried some piriformis stretching, which he says helped, but she feels like the pain is located slightly more inferiorly. Denies any weakness associated with this.  Past medical history, social history, medications, and allergies were reviewed and are up to date in the chart. Review of Systems 7 point review of systems was performed and was otherwise negative unless noted in the history of present illness.     Objective:   Physical Exam BP 108/52 mmHg  Pulse 50  Ht 5\' 4"  (1.626 m)  Wt 122 lb (55.339 kg)  BMI 20.93 kg/m2 GEN: The patient is well-developed well-nourished female and in no acute distress.  She is awake alert and oriented x3. SKIN: warm and well-perfused, no rash  EXTR: No lower extremity edema or calf tenderness Neuro: Strength 5/5 globally. Sensation intact throughout. No focal deficits. Vasc: +2 bilateral distal pulses. No edema.  MSK: Examination of the left foot reveals no swelling. Dropping of the transverse arch. This is correctable in the custom orthotics. Mild tenderness to palpation at the plantar surface of  the second metatarsal head. She has good range of motion of the MTP joint. No localized swelling or induration. Examination of the left hip reveals full internal/external range of motion without pain or catching. Diffusely mildly tender to palpation, though no point tenderness over any bony structure. Pain is slightly reproduced with resisted hamstring flexion. She is slightly tender in the area of the hamstring insertion. The hamstring is grossly intact. Doing a figure-of-four with her legs and flexing her hips re-creates the pain in the region of her left piriformis. No tenderness over the greater trochanteric bursa.  Limited musculoskeletal ultrasound: Long and short axis views were obtained of the left second metatarsal. No obvious cortical disruption. There is an area on the plantar surface around the second metatarsal head with some slight hypoechoic density seen in the soft tissues. No Morton's neuroma is seen. The joint appears to be without additional fluid. There is slight increased vascularity around the area of hypoechoic change in the soft tissues. No periosteal edema.     Assessment & Plan:  Please see problem based assessment and plan in the problem list.

## 2015-05-06 ENCOUNTER — Other Ambulatory Visit: Payer: Self-pay

## 2015-05-06 DIAGNOSIS — Z1231 Encounter for screening mammogram for malignant neoplasm of breast: Secondary | ICD-10-CM

## 2015-06-21 ENCOUNTER — Ambulatory Visit: Payer: Commercial Managed Care - PPO

## 2015-07-06 ENCOUNTER — Ambulatory Visit
Admission: RE | Admit: 2015-07-06 | Discharge: 2015-07-06 | Disposition: A | Discharge status: Home / Self Care | Payer: Commercial Managed Care - PPO | Source: Ambulatory Visit

## 2015-07-06 DIAGNOSIS — Z1231 Encounter for screening mammogram for malignant neoplasm of breast: Secondary | ICD-10-CM

## 2015-10-07 ENCOUNTER — Ambulatory Visit: Payer: Commercial Managed Care - PPO | Admitting: Internal Medicine

## 2015-10-11 ENCOUNTER — Encounter: Payer: Self-pay | Admitting: Internal Medicine

## 2015-10-11 ENCOUNTER — Ambulatory Visit (INDEPENDENT_AMBULATORY_CARE_PROVIDER_SITE_OTHER): Payer: Commercial Managed Care - PPO | Admitting: Internal Medicine

## 2015-10-11 VITALS — BP 100/60 | HR 62 | Ht 64.0 in | Wt 124.0 lb

## 2015-10-11 DIAGNOSIS — R002 Palpitations: Secondary | ICD-10-CM

## 2015-10-11 DIAGNOSIS — I471 Supraventricular tachycardia: Secondary | ICD-10-CM | POA: Diagnosis not present

## 2015-10-11 NOTE — Progress Notes (Signed)
HPI Ms. Colaianni returns today for followup. 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 The patient has experienced increasing palpitations over the past few weeks. No sustained racing. She thinks it feels like the heart is about to start racing. 3-4 episodes a week. No syncope of sustained heart racing.    No Known Allergies   Current Outpatient Prescriptions  Medication Sig Dispense Refill  . aspirin EC 81 MG tablet Take 81 mg by mouth daily.    . Calcium Carbonate-Vit D-Min (CALCIUM 600 + MINERALS PO) Take 1 capsule by mouth daily.    . clonazePAM (KLONOPIN) 0.5 MG tablet Take 0.5 mg by mouth 4 (four) times daily as needed for anxiety.    . mirtazapine (REMERON) 15 MG tablet Take 7.5 mg by mouth at bedtime.    Vladimir Faster Glycol-Propyl Glycol (SYSTANE OP) Place 2 drops into both eyes at bedtime.    . valACYclovir (VALTREX) 1000 MG tablet Take 0.5 tablets by mouth 2 (two) times daily as needed. Breakouts  0   No current facility-administered medications for this visit.      Past Medical History:  Diagnosis Date  . Anxiety   . Atrial flutter with rapid ventricular response ([place])   . Bunion   . Colon polyps   . Dysrhythmia 07/2013   SVT  . Dysthymic disorder   . Enthesopathy of ankle and tarsus, unspecified   . Esophageal reflux   . Family history of malignant neoplasm of gastrointestinal tract   . Hiatal hernia   . Irritable bowel syndrome   . 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[place])   . Other acquired calcaneus deformity   . Personal history of colonic polyps 2002   hyperplastic  . Plantar fascial fibromatosis   . Rotator cuff syndrome of shoulder and allied disorders   . Skin cancer   . Syncope     ROS:   All systems reviewed and negative except as noted in the HPI.   Past Surgical History:  Procedure Laterality Date  . ABLATION OF DYSRHYTHMIC FOCUS  07/31/2013   DR Lovena Le  .  ARTHROPLASTY  2006   thumb  . BRAVO [place] STUDY  02/01/2011   Procedure: BRAVO [place] STUDY;  Surgeon: Inda Castle, MD;  Location: [facility];  Service: Endoscopy;  Laterality: N/A;  . BUNIONECTOMY    . CESAREAN SECTION    . COLONOSCOPY  07/16/2012  . ESOPHAGOGASTRODUODENOSCOPY  02/01/2011  . PARTIAL HYSTERECTOMY     Still has ovaries  . PLACEMENT OF BREAST IMPLANTS    . SUPRAVENTRICULAR TACHYCARDIA ABLATION N/A 07/31/2013   Procedure: SUPRAVENTRICULAR TACHYCARDIA ABLATION;  Surgeon: Evans Lance, MD;  Location: [facility] CATH LAB;  Service: Cardiovascular;  Laterality: N/A;     Family History  Problem Relation Age of Onset  . Colon cancer Father 50  . Heart disease Mother   . Breast cancer Maternal Aunt   . Ovarian cancer Cousin      Social History   Social History  . Marital status: Divorced    Spouse name: N/A  . Number of children: 3  . Years of education: N/A   Occupational History  . [employer] assistant [employer]   Social History Main Topics  . Smoking status: Former Smoker    Quit date: 03/19/1978  . Smokeless tobacco: Never Used  . Alcohol use No  Comment: rarely  . Drug use: No  . Sexual activity: Not on file   Other Topics Concern  . Not on file   Social History Narrative  . No narrative on file     BP 100/60   Pulse 62   Ht 5\' 4"  (1.626 m)   Wt 124 lb (56.2 kg)   BMI 21.28 kg/m   Physical Exam:  Well appearing middle aged woman, looking younger than her stated age, NAD HEENT: Unremarkable Neck:  6 cm JVD, no thyromegally Back:  No CVA tenderness Lungs:  Clear with no wheezes HEART:  Regular rate rhythm, no murmurs, no rubs, no clicks Abd:  soft, positive bowel sounds, no organomegally, no rebound, no guarding Ext:  2 plus pulses, no edema, no cyanosis, no clubbing Skin:  No rashes no nodules Neuro:  CN II through XII intact, motor grossly intact  EKG NSR   Assess/Plan: 1. SVT - she has had no sustained symptoms since her  ablation. She will undergo watchful waiting. 2. Palpitations - it sounds like she is having either PAC's and or PVC's. I have offered her a heart monitor but she refuses. I have asked her to reduce her caffeine intake. 3. Syncope - she has had no recurrent syncope since her ablation. Will follow.  Mikle Bosworth.D.

## 2015-10-11 NOTE — Patient Instructions (Addendum)
Medication Instructions:  Your physician recommends that you continue on your current medications as directed. Please refer to the Current Medication list given to you today.  Labwork: None ordered  Testing/Procedures: None ordered  Follow-Up: No follow up is needed at this time with Dr. Lovena Le.  He will see you on an as needed basis.   If you need a refill on your cardiac medications before your next appointment, please call your pharmacy.  Thank you for choosing [facility]!!

## 2015-10-13 ENCOUNTER — Ambulatory Visit: Payer: Commercial Managed Care - PPO | Admitting: Internal Medicine

## 2015-10-27 NOTE — Addendum Note (Signed)
Addended by: Freada Bergeron on: 10/27/2015 12:40 PM   Modules accepted: Orders

## 2016-05-21 ENCOUNTER — Other Ambulatory Visit: Payer: Self-pay | Admitting: Obstetrics & Gynecology

## 2016-05-21 DIAGNOSIS — E559 Vitamin D deficiency, unspecified: Secondary | ICD-10-CM

## 2016-07-09 ENCOUNTER — Other Ambulatory Visit: Payer: Self-pay | Admitting: Obstetrics & Gynecology

## 2016-07-09 ENCOUNTER — Ambulatory Visit
Admission: RE | Admit: 2016-07-09 | Discharge: 2016-07-09 | Disposition: A | Discharge status: Home / Self Care | Payer: Commercial Managed Care - PPO | Source: Ambulatory Visit | Attending: Obstetrics & Gynecology | Admitting: Obstetrics & Gynecology

## 2016-07-09 DIAGNOSIS — E559 Vitamin D deficiency, unspecified: Secondary | ICD-10-CM

## 2016-09-11 ENCOUNTER — Telehealth: Payer: Self-pay | Admitting: Gastroenterology

## 2016-09-11 NOTE — Telephone Encounter (Signed)
Patient reports that she is having chest pain that wakes her up at night. She has had dysphagia to solids for the last few weeks.  When out to dinner a few weeks ago she had the Heimlich maneuver performed on her while eating a steak at the [facility].  She will come in and see Alonza Bogus, PA on 09/13/16 10:30

## 2016-09-13 ENCOUNTER — Encounter: Payer: Self-pay | Admitting: Gastroenterology

## 2016-09-13 ENCOUNTER — Ambulatory Visit (INDEPENDENT_AMBULATORY_CARE_PROVIDER_SITE_OTHER): Payer: Commercial Managed Care - PPO | Admitting: Gastroenterology

## 2016-09-13 VITALS — BP 104/74 | HR 60 | Ht 64.0 in | Wt 119.0 lb

## 2016-09-13 DIAGNOSIS — R1013 Epigastric pain: Secondary | ICD-10-CM | POA: Diagnosis not present

## 2016-09-13 DIAGNOSIS — K219 Gastro-esophageal reflux disease without esophagitis: Secondary | ICD-10-CM

## 2016-09-13 DIAGNOSIS — G8929 Other chronic pain: Secondary | ICD-10-CM | POA: Diagnosis not present

## 2016-09-13 DIAGNOSIS — R1310 Dysphagia, unspecified: Secondary | ICD-10-CM

## 2016-09-13 DIAGNOSIS — K5900 Constipation, unspecified: Secondary | ICD-10-CM

## 2016-09-13 MED ORDER — DEXLANSOPRAZOLE 60 MG PO CPDR: 60.0000 mg | DELAYED_RELEASE_CAPSULE | Freq: Every day | ORAL | 5 refills | Status: DC

## 2016-09-13 NOTE — Patient Instructions (Addendum)
If you are age 64 or older, your body mass index should be between 23-30. Your Body mass index is 20.43 kg/m. If this is out of the aforementioned range listed, please consider follow up with your Primary Care Provider.  If you are age 5 or younger, your body mass index should be between 19-25. Your Body mass index is 20.43 kg/m. If this is out of the aformentioned range listed, please consider follow up with your Primary Care Provider.   We have sent the following medications to your pharmacy for you to pick up at your convenience: Dexilant 60 mg  Consider adding Miralax daily to bowel regimen.  Follow up with Dr. Fuller Plan on November 05, 2016 at 11:15 a.m.  Thank you for choosing me and [facility].   Alonza Bogus, PA-C

## 2016-09-13 NOTE — Progress Notes (Signed)
Reviewed and agree with initial management plan.  Albion Weatherholtz T. Bhumi Godbey, MD FACG 

## 2016-09-13 NOTE — Progress Notes (Signed)
[name] 66 y.o. 1950-06-17 [id number]  Assessment & Plan:   Starting her on Dexilant 60mg  QD for dysphagia/GERD/epigastric pain listed below - she responded well to this in the past.  1. Dysphagia - Esophageal dysmotility was noted on 2012 esophageal manometry study with Dr. Fuller Plan. May be related to untreated reflux.   2. GERD - Though patient didn't think she had GERD because she hasn't been experiencing regurgitation of food or feeling acid up the length of her esophagus, her history, reports of frequent belching, and hiatal hernia noted on endoscopy suggests she does.  3.Epigastric pain - likely related to GERD and possibly exacerbated by hiatal hernia, which was also noted on 2012 endoscopies.   4. Constipation - longtime, ongoing problem. Somewhat controlled with daily Metamucil. Discussed possibility of adding Miralax or milk of magnesia (which she has tried and liked) at opposite time of day.    Subjective:   Chief Complaint: Epigastric pain at night, dysphagia   HPI [name] is a 66 year old female with a history of IBS-C, GERD, dysphagia, dysthymic disorder,  who presents with 3 weeks of epigastric pain which wakes her up multiple times at night and is relieved when she sits up. She has a hard time qualifying the pain as either sharp or achy, but denies any associated SOB, diaphoresis, nausea, vomiting.  She does report frequent belching and feeling like she has trouble "getting down" meat and bread, even with excessive chewing.   She has experienced these issues in the past, but says they've been worse the past few weeks. Not currently taking Dexilant, other PPI, or Reglan which was prescribed to her by Dr. Sharlett Iles in 2012 for similar issues. Dr. Sharlett Iles noted a marked improvement in these sx with these meds, but she doesn't remember why she stopped taking them.  We also discussed her ongoing IBS-C, which she says is somewhat controlled with daily use of  Metamucil. She is able to have daily soft bowel movements, but reports they feel incomplete.   No Known Allergies Current Meds  Medication Sig  . aspirin EC 81 MG tablet Take 81 mg by mouth daily.  . Calcium Carbonate-Vit D-Min (CALCIUM 600 + MINERALS PO) Take 1 capsule by mouth daily.  . clonazePAM (KLONOPIN) 0.5 MG tablet Take 0.5 mg by mouth 4 (four) times daily as needed for anxiety.  Marland Kitchen estradiol (ESTRACE) 0.5 MG tablet   . Omega 3 1200 MG CAPS Fish Oil  . Polyethyl Glycol-Propyl Glycol (SYSTANE OP) Place 2 drops into both eyes at bedtime.  . valACYclovir (VALTREX) 1000 MG tablet Take 0.5 tablets by mouth 2 (two) times daily as needed. Breakouts  . zolpidem (AMBIEN CR) 6.25 MG CR tablet Take 6.25 mg by mouth at bedtime as needed for sleep.   Past Medical History:  Diagnosis Date  . Anxiety   . Atrial flutter with rapid ventricular response ([place])   . Bunion   . Colon polyps   . Dysrhythmia 07/2013   SVT  . Dysthymic disorder   . Enthesopathy of ankle and tarsus, unspecified   . Esophageal reflux   . Family history of malignant neoplasm of gastrointestinal tract   . Hiatal hernia   . Irritable bowel syndrome   . 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[place])   . Other acquired calcaneus deformity   . Personal history of colonic polyps 2002   hyperplastic  . Plantar fascial fibromatosis   . Rotator cuff syndrome of shoulder and allied disorders   . Skin  cancer   . Syncope    Past Surgical History:  Procedure Laterality Date  . ABLATION OF DYSRHYTHMIC FOCUS  07/31/2013   DR Lovena Le  . ARTHROPLASTY  2006   thumb  . BRAVO [place] STUDY  02/01/2011   Procedure: BRAVO [place] STUDY;  Surgeon: Inda Castle, MD;  Location: [facility];  Service: Endoscopy;  Laterality: N/A;  . BUNIONECTOMY    . CESAREAN SECTION    . COLONOSCOPY  07/16/2012  . ESOPHAGOGASTRODUODENOSCOPY  02/01/2011  . PARTIAL HYSTERECTOMY     Still has ovaries  . PLACEMENT OF BREAST IMPLANTS    . SHOULDER SURGERY    .  SUPRAVENTRICULAR TACHYCARDIA ABLATION N/A 07/31/2013   Procedure: SUPRAVENTRICULAR TACHYCARDIA ABLATION;  Surgeon: Evans Lance, MD;  Location: [facility] CATH LAB;  Service: Cardiovascular;  Laterality: N/A;   Social History   Social History  . Marital status: Divorced    Spouse name: N/A  . Number of children: 3  . Years of education: N/A   Occupational History  . [employer] assistant [employer]   Social History Main Topics  . Smoking status: Former Smoker    Quit date: 03/19/1978  . Smokeless tobacco: Never Used  . Alcohol use No     Comment: rarely  . Drug use: No  . Sexual activity: Not on file   Other Topics Concern  . Not on file   Social History Narrative  . No narrative on file   family history includes Breast cancer in her maternal aunt; Colon cancer (age of onset: 56) in her father; Heart disease in her mother; Ovarian cancer in her cousin.   Review of Systems Noted in HPI, otherwise negative.   Objective:   Physical Exam  @BP  104/74   Pulse 60   Ht 5\' 4"  (1.626 m)   Wt 119 lb (54 kg)   BMI 20.43 kg/m @  General:  Well-developed, well-nourished and in no acute distress Eyes:  anicteric.Marland Kitchen  Neck:   supple w/o thyromegaly or mass.  Lungs: Clear to auscultation bilaterally. Heart:  S1S2, no rubs, murmurs, gallops. Abdomen:  soft, non-tender, no hepatosplenomegaly, hernia, or mass and BS+.  Rectal: Not done Lymph:  no cervical or supraclavicular adenopathy. Extremities:   no edema, cyanosis or clubbing Skin   no rash. Neuro:  A&O x 3.  Psych:  appropriate mood and  Affect.   Data Reviewed: Prior notes and endoscopy reports reviewed    Janeece Riggers, PA-S

## 2016-10-17 ENCOUNTER — Ambulatory Visit: Payer: Commercial Managed Care - PPO | Admitting: Gastroenterology

## 2016-11-05 ENCOUNTER — Other Ambulatory Visit (INDEPENDENT_AMBULATORY_CARE_PROVIDER_SITE_OTHER): Payer: Commercial Managed Care - PPO

## 2016-11-05 ENCOUNTER — Encounter: Payer: Self-pay | Admitting: Gastroenterology

## 2016-11-05 ENCOUNTER — Ambulatory Visit (INDEPENDENT_AMBULATORY_CARE_PROVIDER_SITE_OTHER): Payer: Commercial Managed Care - PPO | Admitting: Gastroenterology

## 2016-11-05 VITALS — BP 114/76 | HR 68 | Ht 63.75 in | Wt 119.0 lb

## 2016-11-05 DIAGNOSIS — K219 Gastro-esophageal reflux disease without esophagitis: Secondary | ICD-10-CM

## 2016-11-05 DIAGNOSIS — R1310 Dysphagia, unspecified: Secondary | ICD-10-CM

## 2016-11-05 DIAGNOSIS — K5904 Chronic idiopathic constipation: Secondary | ICD-10-CM

## 2016-11-05 LAB — CBC WITH DIFFERENTIAL/PLATELET
BASOS PCT: 0.9 % (ref 0.0–3.0)
Basophils Absolute: 0 10*3/uL (ref 0.0–0.1)
EOS PCT: 1.8 % (ref 0.0–5.0)
Eosinophils Absolute: 0.1 10*3/uL (ref 0.0–0.7)
HCT: 41.6 % (ref 36.0–46.0)
Hemoglobin: 13.9 g/dL (ref 12.0–15.0)
LYMPHS ABS: 1.6 10*3/uL (ref 0.7–4.0)
Lymphocytes Relative: 29.5 % (ref 12.0–46.0)
MCHC: 33.5 g/dL (ref 30.0–36.0)
MCV: 92.4 fl (ref 78.0–100.0)
MONOS PCT: 7.8 % (ref 3.0–12.0)
Monocytes Absolute: 0.4 10*3/uL (ref 0.1–1.0)
Neutro Abs: 3.2 10*3/uL (ref 1.4–7.7)
Neutrophils Relative %: 60 % (ref 43.0–77.0)
PLATELETS: 308 10*3/uL (ref 150.0–400.0)
RBC: 4.5 Mil/uL (ref 3.87–5.11)
RDW: 13.7 % (ref 11.5–15.5)
WBC: 5.3 10*3/uL (ref 4.0–10.5)

## 2016-11-05 LAB — BASIC METABOLIC PANEL
BUN: 13 mg/dL (ref 6–23)
CHLORIDE: 102 meq/L (ref 96–112)
CO2: 30 mEq/L (ref 19–32)
Calcium: 9.7 mg/dL (ref 8.4–10.5)
Creatinine, Ser: 0.84 mg/dL (ref 0.40–1.20)
GFR: 72.53 mL/min (ref >60.00)
GLUCOSE: 105 mg/dL — AB (ref 70–99)
Potassium: 3.7 mEq/L (ref 3.5–5.1)
Sodium: 140 mEq/L (ref 135–145)

## 2016-11-05 LAB — HEPATIC FUNCTION PANEL
ALK PHOS: 65 U/L (ref 39–117)
ALT: 14 U/L (ref 0–35)
AST: 22 U/L (ref 0–37)
Albumin: 4.3 g/dL (ref 3.5–5.2)
BILIRUBIN DIRECT: 0.1 mg/dL (ref 0.0–0.3)
BILIRUBIN TOTAL: 0.4 mg/dL (ref 0.2–1.2)
Total Protein: 7.4 g/dL (ref 6.0–8.3)

## 2016-11-05 LAB — TSH: TSH: 0.85 u[IU]/mL (ref 0.35–4.50)

## 2016-11-05 MED ORDER — RANITIDINE HCL 300 MG PO TABS: 300.0000 mg | ORAL_TABLET | Freq: Every day | ORAL | 11 refills | Status: DC

## 2016-11-05 MED ORDER — PANTOPRAZOLE SODIUM 40 MG PO TBEC: 40.0000 mg | DELAYED_RELEASE_TABLET | Freq: Two times a day (BID) | ORAL | 11 refills | Status: DC

## 2016-11-05 NOTE — Patient Instructions (Addendum)
Stop taking Dexilant.   Your physician has requested that you go to the basement for lab work before leaving today.   We have sent the following medications to your pharmacy for you to pick up at your convenience: pantoprazole twice daily before meals and ranitidine 300 mg to take at bedtime.   You have been scheduled for a Barium Esophogram at [facility] (1st floor of the hospital) on 11/20/16 at 9:30am. Please arrive 15 minutes prior to your appointment for registration. Make certain not to have anything to eat or drink 3 hours prior to your test. If you need to reschedule for any reason, please contact radiology at [phone] to do so. __________________________________________________________________ A barium swallow is an examination that concentrates on views of the esophagus. This tends to be a double contrast exam (barium and two liquids which, when combined, create a gas to distend the wall of the oesophagus) or single contrast (non-ionic iodine based). The study is usually tailored to your symptoms so a good history is essential. Attention is paid during the study to the form, structure and configuration of the esophagus, looking for functional disorders (such as aspiration, dysphagia, achalasia, motility and reflux) EXAMINATION You may be asked to change into a gown, depending on the type of swallow being performed. A radiologist and radiographer will perform the procedure. The radiologist will advise you of the type of contrast selected for your procedure and direct you during the exam. You will be asked to stand, sit or lie in several different positions and to hold a small amount of fluid in your mouth before being asked to swallow while the imaging is performed .In some instances you may be asked to swallow barium coated marshmallows to assess the motility of a solid food bolus. The exam can be recorded as a digital or video fluoroscopy procedure. POST PROCEDURE It will take  1-2 days for the barium to pass through your system. To facilitate this, it is important, unless otherwise directed, to increase your fluids for the next 24-48hrs and to resume your normal diet.  This test typically takes about 30 minutes to perform. _________________________________________________________________________  Normal BMI (Body Mass Index- based on height and weight) is between 19 and 25. Your BMI today is Body mass index is 20.59 kg/m. Marland Kitchen Please consider follow up  regarding your BMI with your Primary Care Provider.  Thank you for choosing me and [facility].  Pricilla Riffle. Dagoberto Ligas., MD., Marval Regal

## 2016-11-05 NOTE — Progress Notes (Signed)
    History of Present Illness: This is a 66 year old female returning for follow up of dysphagia and constipation. Bravo pH study in 2012 with DeMeester score of 43. Esophageal manometry study in 2012 showed 45% peristaltic contractions. She relates particular problems with nocturnal reflux symptoms and states she needs needs to sleep almost upright to prevent regurgitation, chest pain. She has intermittent solid food dysphagia. Dexliant has not been helpful in controlling her symptoms. She states Gaviscon provides temporary relief. She has had ongoing difficulties with constipation but notes that since taking Gaviscon more regularly the evenings her constipation has substantially improved.  Medications, Allergies, Past Medical History, Past Surgical History, Family History and Social History were reviewed in [employer] record.  Physical Exam: General: Well developed, well nourished, no acute distress Head: Normocephalic and atraumatic Eyes:  sclerae anicteric, EOMI Ears: Normal auditory acuity Mouth: No deformity or lesions Lungs: Clear throughout to auscultation Heart: Regular rate and rhythm; no murmurs, rubs or bruits Abdomen: Soft, non tender and non distended. No masses, hepatosplenomegaly or hernias noted. Normal Bowel sounds Musculoskeletal: Symmetrical with no gross deformities  Pulses:  Normal pulses noted Extremities: No clubbing, cyanosis, edema or deformities noted Neurological: Alert oriented x 4, grossly nonfocal Psychological:  Alert and cooperative. Normal mood and affect  Assessment and Recommendations:  1.  GERD. DC Dexilant. Begin pantoprazole 40 mg by mouth twice a day before meals and ranitidine 300 mg at bedtime. Follow all standard antireflux measures. REV in about 6 weeks.   2.  Dysphagia. 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 CMP, CBC, TSH today. Schedule barium esophagram. Consider EGD  and esophageal manometry for further evaluation.  3. Constipation. Continue Gaviscon at bedtime.  4. Family history of colon cancer. Five-year interval higher risk screening colonoscopy is recommended for April 2019.

## 2016-11-20 ENCOUNTER — Ambulatory Visit (HOSPITAL_COMMUNITY)
Admission: RE | Admit: 2016-11-20 | Discharge: 2016-11-20 | Disposition: A | Discharge status: Home / Self Care | Payer: Commercial Managed Care - PPO | Source: Ambulatory Visit | Attending: Gastroenterology | Admitting: Gastroenterology

## 2016-11-20 DIAGNOSIS — R1310 Dysphagia, unspecified: Secondary | ICD-10-CM | POA: Diagnosis present

## 2016-11-20 DIAGNOSIS — K449 Diaphragmatic hernia without obstruction or gangrene: Secondary | ICD-10-CM | POA: Insufficient documentation

## 2016-11-20 DIAGNOSIS — K219 Gastro-esophageal reflux disease without esophagitis: Secondary | ICD-10-CM | POA: Diagnosis present

## 2016-12-24 ENCOUNTER — Ambulatory Visit: Payer: Commercial Managed Care - PPO | Admitting: Gastroenterology

## 2017-05-21 ENCOUNTER — Encounter: Payer: Self-pay | Admitting: Gastroenterology

## 2017-05-21 ENCOUNTER — Other Ambulatory Visit: Payer: Self-pay | Admitting: Obstetrics & Gynecology

## 2017-05-21 DIAGNOSIS — Z1231 Encounter for screening mammogram for malignant neoplasm of breast: Secondary | ICD-10-CM

## 2017-06-28 ENCOUNTER — Other Ambulatory Visit: Payer: Self-pay

## 2017-06-28 ENCOUNTER — Ambulatory Visit (AMBULATORY_SURGERY_CENTER): Payer: Self-pay | Admitting: *Deleted

## 2017-06-28 VITALS — Ht 64.0 in | Wt 122.0 lb

## 2017-06-28 DIAGNOSIS — Z800 Family history of malignant neoplasm of digestive organs: Secondary | ICD-10-CM

## 2017-06-28 MED ORDER — NA SULFATE-K SULFATE-MG SULF 17.5-3.13-1.6 GM/177ML PO SOLN: 1.0000 | Freq: Once | ORAL | 0 refills | Status: AC

## 2017-06-28 NOTE — Progress Notes (Signed)
No egg or soy allergy known to patient  No issues with past sedation with any surgeries  or procedures, no intubation problems  No diet pills per patient No home 02 use per patient  No blood thinners per patient  Pt denies issues with constipation  Hx  A flutter- past hx ablation- no A Fib dx per pt - not on any blood thinners   EMMI video sent to pt's e mail - pt declined  Pt has to stop her metamucil 5 days before and is concerned about constipation - instructed her to use an OTC stool softener these 5 days and call if has concerns for further instructions

## 2017-07-08 ENCOUNTER — Encounter: Payer: Self-pay | Admitting: Gastroenterology

## 2017-07-10 ENCOUNTER — Ambulatory Visit
Admission: RE | Admit: 2017-07-10 | Discharge: 2017-07-10 | Disposition: A | Discharge status: Home / Self Care | Payer: Commercial Managed Care - PPO | Source: Ambulatory Visit | Attending: Obstetrics & Gynecology | Admitting: Obstetrics & Gynecology

## 2017-07-10 DIAGNOSIS — Z1231 Encounter for screening mammogram for malignant neoplasm of breast: Secondary | ICD-10-CM

## 2017-07-11 ENCOUNTER — Telehealth: Payer: Self-pay | Admitting: Gastroenterology

## 2017-07-11 NOTE — Telephone Encounter (Signed)
Returned patients call. Patient was told that she could take milk of magnesium up until the day before her procedure when she began her clear liquid diet. Patient also asked if she could have oatmeal and cereal. I told her that she could have the cereal if it did not have seeds or nuts in it. Patient verbalizes understanding.   Riki Sheer, LPN

## 2017-07-11 NOTE — Telephone Encounter (Signed)
Pt has questions regarding diets for the 5 days before procedure, she wants to know if she can have milk of Magnesia if she needs it as she cannot have metamucil and suffers from chronic constipation and also unprocessed wheat bran. pls call her.

## 2017-07-19 ENCOUNTER — Other Ambulatory Visit: Payer: Self-pay

## 2017-07-19 ENCOUNTER — Encounter: Payer: Self-pay | Admitting: Gastroenterology

## 2017-07-19 ENCOUNTER — Ambulatory Visit (AMBULATORY_SURGERY_CENTER): Payer: Commercial Managed Care - PPO | Admitting: Gastroenterology

## 2017-07-19 VITALS — BP 98/64 | HR 62 | Temp 98.7°F | Resp 18 | Ht 64.0 in | Wt 122.0 lb

## 2017-07-19 DIAGNOSIS — Z800 Family history of malignant neoplasm of digestive organs: Secondary | ICD-10-CM

## 2017-07-19 DIAGNOSIS — Z1211 Encounter for screening for malignant neoplasm of colon: Secondary | ICD-10-CM

## 2017-07-19 MED ORDER — SODIUM CHLORIDE 0.9 % IV SOLN: 500.0000 mL | Freq: Once | INTRAVENOUS | Status: DC

## 2017-07-19 NOTE — Patient Instructions (Signed)
YOU HAD AN ENDOSCOPIC PROCEDURE TODAY AT THE  ENDOSCOPY CENTER:   Refer to the procedure report that was given to you for any specific questions about what was found during the examination.  If the procedure report does not answer your questions, please call your gastroenterologist to clarify.  If you requested that your care partner not be given the details of your procedure findings, then the procedure report has been included in a sealed envelope for you to review at your convenience later.  YOU SHOULD EXPECT: Some feelings of bloating in the abdomen. Passage of more gas than usual.  Walking can help get rid of the air that was put into your GI tract during the procedure and reduce the bloating. If you had a lower endoscopy (such as a colonoscopy or flexible sigmoidoscopy) you may notice spotting of blood in your stool or on the toilet paper. If you underwent a bowel prep for your procedure, you may not have a normal bowel movement for a few days.  Please Note:  You might notice some irritation and congestion in your nose or some drainage.  This is from the oxygen used during your procedure.  There is no need for concern and it should clear up in a day or so.  SYMPTOMS TO REPORT IMMEDIATELY:   Following lower endoscopy (colonoscopy or flexible sigmoidoscopy):  Excessive amounts of blood in the stool  Significant tenderness or worsening of abdominal pains  Swelling of the abdomen that is new, acute  Fever of 100F or higher  For urgent or emergent issues, a gastroenterologist can be reached at any hour by calling [phone].   DIET:  We do recommend a small meal at first, but then you may proceed to your regular diet.  Drink plenty of fluids but you should avoid alcoholic beverages for 24 hours.  MEDICATIONS: Continue present medications.  Please see handouts given to you by your recovery nurse.  ACTIVITY:  You should plan to take it easy for the rest of today and you should NOT  DRIVE or use heavy machinery until tomorrow (because of the sedation medicines used during the test).    FOLLOW UP: Our staff will call the number listed on your records the next business day following your procedure to check on you and address any questions or concerns that you may have regarding the information given to you following your procedure. If we do not reach you, we will leave a message.  However, if you are feeling well and you are not experiencing any problems, there is no need to return our call.  We will assume that you have returned to your regular daily activities without incident.  If any biopsies were taken you will be contacted by phone or by letter within the next 1-3 weeks.  Please call us at [phone] if you have not heard about the biopsies in 3 weeks.   Thank you for allowing us to provide for your healthcare needs today.   SIGNATURES/CONFIDENTIALITY: You and/or your care partner have signed paperwork which will be entered into your electronic medical record.  These signatures attest to the fact that that the information above on your After Visit Summary has been reviewed and is understood.  Full responsibility of the confidentiality of this discharge information lies with you and/or your care-partner. 

## 2017-07-19 NOTE — Progress Notes (Signed)
To recovery, report to RN, VSS. 

## 2017-07-19 NOTE — Op Note (Addendum)
[place] Patient Name: [name] Procedure Date: 07/19/2017 8:25 AM MRN: [id number] Endoscopist: Ladene Artist , MD Age: 67 Referring MD:  Date of Birth: Jan 19, 1951 Gender: Female Account #: [id number] Procedure:                Colonoscopy Indications:              Screening in patient at increased risk: Family                            history of 1st-degree relative with colorectal                            cancer Medicines:                Monitored Anesthesia Care Procedure:                Pre-Anesthesia Assessment:                           - Prior to the procedure, a History and Physical                            was performed, and patient medications and                            allergies were reviewed. The patient's tolerance of                            previous anesthesia was also reviewed. The risks                            and benefits of the procedure and the sedation                            options and risks were discussed with the patient.                            All questions were answered, and informed consent                            was obtained. Prior Anticoagulants: The patient has                            taken no previous anticoagulant or antiplatelet                            agents. ASA Grade Assessment: II - A patient with                            mild systemic disease. After reviewing the risks                            and benefits, the patient was deemed in  satisfactory condition to undergo the procedure.                           After obtaining informed consent, the colonoscope                            was passed under direct vision. Throughout the                            procedure, the patient's blood pressure, pulse, and                            oxygen saturations were monitored continuously. The                            Colonoscope was introduced through the anus and                 advanced to the the cecum, identified by                            appendiceal orifice and ileocecal valve. The                            ileocecal valve, appendiceal orifice, and rectum                            were photographed. The quality of the bowel                            preparation was good. The patient tolerated the                            procedure well. The colonoscopy was somewhat                            difficult due to a tortuous colon. Scope In: 8:29:01 AM Scope Out: 8:45:56 AM Scope Withdrawal Time: 0 hours 9 minutes 37 seconds  Total Procedure Duration: 0 hours 16 minutes 55 seconds  Findings:                 The perianal and digital rectal examinations were                            normal.                           Internal hemorrhoids were found during                            retroflexion. The hemorrhoids were small and Grade                            I (internal hemorrhoids that do not prolapse).  The exam was otherwise without abnormality on                            direct and retroflexion views. Complications:            No immediate complications. Estimated blood loss:                            None. Estimated Blood Loss:     Estimated blood loss: none. Impression:               - Small internal hemorrhoids.                           - The examination was otherwise normal on direct                            and retroflexion views.                           - No specimens collected. Recommendation:           - Repeat colonoscopy in 5 years for screening                            purposes.                           - Patient has a contact number available for                            emergencies. The signs and symptoms of potential                            delayed complications were discussed with the                            patient. Return to normal activities tomorrow.                             Written discharge instructions were provided to the                            patient.                           - Resume previous diet.                           - Continue present medications. Ladene Artist, MD 07/19/2017 8:48:26 AM This report has been signed electronically.

## 2017-07-19 NOTE — Progress Notes (Signed)
No changes in medical or surgical hx since PV per pt 

## 2017-07-22 ENCOUNTER — Telehealth: Payer: Self-pay

## 2017-07-22 NOTE — Telephone Encounter (Signed)
  Follow up Call-  Call back number 07/19/2017  Post procedure Call Back phone  # [phone]  Permission to leave phone message Yes  Some recent data might be hidden     Patient questions:  Do you have a fever, pain , or abdominal swelling? No. Pain Score  0 *  Have you tolerated food without any problems? Yes.    Have you been able to return to your normal activities? Yes.    Do you have any questions about your discharge instructions: Diet   No. Medications  No. Follow up visit  No.  Do you have questions or concerns about your Care? No.  Actions: * If pain score is 4 or above: No action needed, pain <4.  No problems noted per pt.

## 2018-05-08 ENCOUNTER — Other Ambulatory Visit: Payer: Self-pay | Admitting: Internal Medicine

## 2018-05-08 DIAGNOSIS — Z1231 Encounter for screening mammogram for malignant neoplasm of breast: Secondary | ICD-10-CM

## 2018-07-14 ENCOUNTER — Ambulatory Visit: Payer: Commercial Managed Care - PPO

## 2018-08-26 ENCOUNTER — Other Ambulatory Visit: Payer: Self-pay

## 2018-08-26 ENCOUNTER — Ambulatory Visit
Admission: RE | Admit: 2018-08-26 | Discharge: 2018-08-26 | Disposition: A | Discharge status: Home / Self Care | Payer: 59 | Source: Ambulatory Visit | Attending: Internal Medicine | Admitting: Internal Medicine

## 2018-08-26 DIAGNOSIS — Z1231 Encounter for screening mammogram for malignant neoplasm of breast: Secondary | ICD-10-CM

## 2018-10-13 ENCOUNTER — Telehealth: Payer: Self-pay | Admitting: Internal Medicine

## 2018-10-13 NOTE — Telephone Encounter (Signed)

## 2018-10-14 ENCOUNTER — Ambulatory Visit (INDEPENDENT_AMBULATORY_CARE_PROVIDER_SITE_OTHER): Payer: 59 | Admitting: Internal Medicine

## 2018-10-14 ENCOUNTER — Other Ambulatory Visit: Payer: Self-pay

## 2018-10-14 ENCOUNTER — Encounter: Payer: Self-pay | Admitting: Internal Medicine

## 2018-10-14 ENCOUNTER — Telehealth: Payer: Self-pay | Admitting: *Deleted

## 2018-10-14 VITALS — BP 112/70 | HR 56 | Ht 64.0 in | Wt 127.8 lb

## 2018-10-14 DIAGNOSIS — R002 Palpitations: Secondary | ICD-10-CM | POA: Diagnosis not present

## 2018-10-14 DIAGNOSIS — I471 Supraventricular tachycardia: Secondary | ICD-10-CM

## 2018-10-14 NOTE — Progress Notes (Signed)
HPI [name] returns today for evaluation of palpitations. She is a pleasant 68 yo woman with SVT who underwent catheter ablation 5 years ago. 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 She still has trouble with insomnia. The patient notes that she has begun to have recurrent episodes of palpitations. She feels her pulse and notes that the pulse will pause at times. She has not had syncope. She denies edema, sob, or chest pain.  No Known Allergies   Current Outpatient Medications  Medication Sig Dispense Refill  . aspirin EC 81 MG tablet Take 81 mg by mouth daily.    . Calcium Carbonate-Vit D-Min (CALCIUM 600 + MINERALS PO) Take 1 capsule by mouth daily.    . cholecalciferol (VITAMIN D) 1000 units tablet Take 2,000 Units by mouth daily.    Marland Kitchen doxepin (SINEQUAN) 50 MG capsule Take 50 mg by mouth daily.    . Omega 3 1200 MG CAPS Fish Oil    . Polyethyl Glycol-Propyl Glycol (SYSTANE OP) Place 2 drops into both eyes at bedtime.    . valACYclovir (VALTREX) 1000 MG tablet Take 0.5 tablets by mouth 2 (two) times daily as needed. Breakouts  0  . zaleplon (SONATA) 10 MG capsule Take 10 mg by mouth at bedtime as needed for sleep.     Current Facility-Administered Medications  Medication Dose Route Frequency Provider Last Rate Last Dose  . 0.9 %  sodium chloride infusion  500 mL Intravenous Once Ladene Artist, MD         Past Medical History:  Diagnosis Date  . Anxiety   . Arthritis    wrist   . Atrial flutter with rapid ventricular response (HCC)    SVT per pt   . Bunion   . Cataract    mild   . Colon polyps   . Dysrhythmia 07/2013   SVT  . Dysthymic disorder   . Enthesopathy of ankle and tarsus, unspecified   . Esophageal reflux   . Family history of malignant neoplasm of gastrointestinal tract   . Hiatal hernia   . Irritable bowel syndrome   . 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[place])   . Other acquired calcaneus deformity   . Personal  history of colonic polyps 2002   hyperplastic  . Plantar fascial fibromatosis   . Rotator cuff syndrome of shoulder and allied disorders   . Skin cancer   . Syncope     ROS:   All systems reviewed and negative except as noted in the HPI.   Past Surgical History:  Procedure Laterality Date  . ABLATION OF DYSRHYTHMIC FOCUS  07/31/2013   DR Lovena Le  . ARTHROPLASTY  2006   thumb/wrist   . AUGMENTATION MAMMAPLASTY Bilateral   . BRAVO [place] STUDY  02/01/2011   Procedure: BRAVO [place] STUDY;  Surgeon: Inda Castle, MD;  Location: [facility] ENDOSCOPY;  Service: Endoscopy;  Laterality: N/A;  . BUNIONECTOMY    . CESAREAN SECTION     x3  . COLONOSCOPY  07/16/2012  . ESOPHAGOGASTRODUODENOSCOPY  02/01/2011  . PARTIAL HYSTERECTOMY     Still has ovaries  . PLACEMENT OF BREAST IMPLANTS    . POLYPECTOMY    . SHOULDER SURGERY    . SUPRAVENTRICULAR TACHYCARDIA ABLATION N/A 07/31/2013   Procedure: SUPRAVENTRICULAR TACHYCARDIA ABLATION;  Surgeon: Evans Lance, MD;  Location: [facility] CATH LAB;  Service: Cardiovascular;  Laterality: N/A;     Family History  Problem Relation  Age of Onset  . Colon cancer Father 38  . Heart disease Mother   . Breast cancer Maternal Aunt   . Ovarian cancer Cousin   . Colon polyps Neg Hx   . Esophageal cancer Neg Hx   . Rectal cancer Neg Hx   . Stomach cancer Neg Hx      Social History   Socioeconomic History  . Marital status: Divorced    Spouse name: Not on file  . Number of children: 3  . Years of education: Not on file  . Highest education level: Not on file  Occupational History  . Occupation: Careers information officer    Employer: [employer], [place]  . Financial resource strain: Not on file  . Food insecurity    Worry: Not on file    Inability: Not on file  . Transportation needs    Medical: Not on file    Non-medical: Not on file  Tobacco Use  . Smoking status: Former Smoker    Quit date: 03/19/1978    Years since quitting: 40.6  .  Smokeless tobacco: Never Used  Substance and Sexual Activity  . Alcohol use: Not Currently    Comment: rarely- twice a yr if that   . Drug use: No  . Sexual activity: Not on file  Lifestyle  . Physical activity    Days per week: Not on file    Minutes per session: Not on file  . Stress: Not on file  Relationships  . Social Herbalist on phone: Not on file    Gets together: Not on file    Attends religious service: Not on file    Active member of club or organization: Not on file    Attends meetings of clubs or organizations: Not on file    Relationship status: Not on file  . Intimate partner violence    Fear of current or ex partner: Not on file    Emotionally abused: Not on file    Physically abused: Not on file    Forced sexual activity: Not on file  Other Topics Concern  . Not on file  Social History Narrative  . Not on file     BP 112/70   Pulse (!) 56   Ht 5\' 4"  (1.626 m)   Wt 127 lb 12.8 oz (58 kg)   SpO2 94%   BMI 21.94 kg/m   Physical Exam:  Well appearing 68 yo woman, NAD HEENT: Unremarkable Neck:  No JVD, no thyromegally Lymphatics:  No adenopathy Back:  No CVA tenderness Lungs:  Clear with no wheezes HEART:  Regular rate rhythm, no murmurs, no rubs, no clicks Abd:  soft, positive bowel sounds, no organomegally, no rebound, no guarding Ext:  2 plus pulses, no edema, no cyanosis, no clubbing Skin:  No rashes no nodules Neuro:  CN II through XII intact, motor grossly intact  EKG - nsr   Assess/Plan: 1. Palpitations - her symptoms are moderate. She notes she will have episodes where she feels 2 heart beats then a pause. 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 She is quite symptomatic and I have recommended she wear a 48 hour cardiac monitor so that we can determine the burden of her PVC's/PAC's and to see if she has other arrhythmias or pauses. I do not think that her symptoms represent recurrent SVT.  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  3. Insomnia - she  is still having trouble getting to sleep.   Mikle Bosworth.D.

## 2018-10-14 NOTE — Patient Instructions (Addendum)
Medication Instructions:  Your physician recommends that you continue on your current medications as directed. Please refer to the Current Medication list given to you today.  Labwork: None ordered.  Testing/Procedures: Your physician has recommended that you wear a holter monitor. Holter monitors are medical devices that record the heart's electrical activity. Doctors most often use these monitors to diagnose arrhythmias. Arrhythmias are problems with the speed or rhythm of the heartbeat. The monitor is a small, portable device. You can wear one while you do your normal daily activities. This is usually used to diagnose what is causing palpitations/syncope (passing out).  Please schedule for a 48 hour monitor.    Follow-Up: Your physician wants you to follow-up in: after your heart monitor results have been received.   Any Other Special Instructions Will Be Listed Below (If Applicable).  If you need a refill on your cardiac medications before your next appointment, please call your pharmacy.

## 2018-10-14 NOTE — Telephone Encounter (Signed)
3 day ZIO XT long term holter monitor to be mailed to patients home.  Instructions reviewed briefly as they are included in the monitor kit.  Patient needs to wear monitor at least 48 hours but can wear up to 72 hours.

## 2018-10-17 ENCOUNTER — Other Ambulatory Visit: Payer: Self-pay | Admitting: Obstetrics and Gynecology

## 2018-10-17 DIAGNOSIS — E2839 Other primary ovarian failure: Secondary | ICD-10-CM

## 2018-10-19 ENCOUNTER — Ambulatory Visit (INDEPENDENT_AMBULATORY_CARE_PROVIDER_SITE_OTHER): Payer: 59

## 2018-10-19 DIAGNOSIS — R002 Palpitations: Secondary | ICD-10-CM

## 2018-10-19 DIAGNOSIS — I471 Supraventricular tachycardia: Secondary | ICD-10-CM | POA: Diagnosis not present

## 2018-12-11 ENCOUNTER — Other Ambulatory Visit: Payer: Self-pay

## 2018-12-11 ENCOUNTER — Ambulatory Visit (INDEPENDENT_AMBULATORY_CARE_PROVIDER_SITE_OTHER): Payer: 59 | Admitting: Nurse Practitioner

## 2018-12-11 ENCOUNTER — Encounter: Payer: Self-pay | Admitting: Nurse Practitioner

## 2018-12-11 VITALS — BP 98/62 | HR 74 | Temp 97.6°F | Ht 64.0 in | Wt 128.1 lb

## 2018-12-11 DIAGNOSIS — R1310 Dysphagia, unspecified: Secondary | ICD-10-CM | POA: Diagnosis not present

## 2018-12-11 DIAGNOSIS — R079 Chest pain, unspecified: Secondary | ICD-10-CM | POA: Diagnosis not present

## 2018-12-11 MED ORDER — OMEPRAZOLE 20 MG PO CPDR: 20.0000 mg | DELAYED_RELEASE_CAPSULE | ORAL | 3 refills | Status: DC

## 2018-12-11 NOTE — Patient Instructions (Signed)
If you are age 66 or older, your body mass index should be between 23-30. Your Body mass index is 21.99 kg/m. If this is out of the aforementioned range listed, please consider follow up with your Primary Care Provider.  If you are age 68 or younger, your body mass index should be between 19-25. Your Body mass index is 21.99 kg/m. If this is out of the aformentioned range listed, please consider follow up with your Primary Care Provider.   We have sent the following medications to your pharmacy for you to pick up at your convenience: Omeprazole 20 mg  You have been scheduled for a Barium Esophogram at [facility] (1st floor of the hospital) on 12/17/18  at 11 am. Please arrive 15 minutes prior to your appointment for registration. Make certain not to have anything to eat or drink 3 hours prior to your test. If you need to reschedule for any reason, please contact radiology at [phone] to do so. __________________________________________________________________ A barium swallow is an examination that concentrates on views of the esophagus. This tends to be a double contrast exam (barium and two liquids which, when combined, create a gas to distend the wall of the oesophagus) or single contrast (non-ionic iodine based). The study is usually tailored to your symptoms so a good history is essential. Attention is paid during the study to the form, structure and configuration of the esophagus, looking for functional disorders (such as aspiration, dysphagia, achalasia, motility and reflux) EXAMINATION You may be asked to change into a gown, depending on the type of swallow being performed. A radiologist and radiographer will perform the procedure. The radiologist will advise you of the type of contrast selected for your procedure and direct you during the exam. You will be asked to stand, sit or lie in several different positions and to hold a small amount of fluid in your mouth before being  asked to swallow while the imaging is performed .In some instances you may be asked to swallow barium coated marshmallows to assess the motility of a solid food bolus. The exam can be recorded as a digital or video fluoroscopy procedure. POST PROCEDURE It will take 1-2 days for the barium to pass through your system. To facilitate this, it is important, unless otherwise directed, to increase your fluids for the next 24-48hrs and to resume your normal diet.  This test typically takes about 30 minutes to perform. _____________________________________________________________________  We will call you with results.  Thank you for choosing me and [facility].   Tye Savoy, NP

## 2018-12-11 NOTE — Progress Notes (Signed)
Chief Complaint:  Chest pain , swallowing problems.     IMPRESSION and PLAN:      68 year old female with worsening of chronic chest pain, mainly nocturnal and immediately relieved by sitting up straight.  Pain not exertional and without associated shortness of breath, palpitations or other symptoms. Her symptoms are atypical for GERD. Atypical for esophageal spasms.  She is also having recurrent dysphagia, mainly to chicken. Known history of non-specific esophageal dysmotility.  - ? Progression of esophageal dysmotility. Will first update esophagram to evaluate for interval lesions (last one was 2 years ago). If normal, consider updating manometry study, last one was in 2012.  -Though asymptomatic she does have GERD based on Bravo study in 2012. Not on acid blockers. I asked to take Omeprazole, at least for now. We can discontinue it in the future if no improvement in symptoms. -If above studies negative, consider trial of Desipramine   HPI:     Patient is a 68 yo female with PMH significant for Raynaud's SVT s/p RFA years ago.  GI history significant for chronic chest pain, intermittent dysphagia, IBS, and a [facility] of colon cancer.  She is due for 5-year interval colonoscopy May 2024.  [place] gives a history of chronic intermittent chest pain ongoing for years. Bravo pH study in 2012 with DeMeester score of 43. She had an esophageal manometry study in 2012 showing 45% peristaltic contractions. Pain is mid chest, it usually occurs about an hour after going to bed.   Sitting up straight immediately stops the pain but it tends to recur a little while later. She has no associated palpitations, nausea, vomiting, shortness of breath or diaphoresis. No heartburn or regurgitation.  GERD medications have never helped the pain.  Over the last 2 weeks the chest pain has been waking her up nearly every night. She sleeps with head of bed elevated at 45 degrees.  Less frequent but the pain does occur  during the day sometimes but  burping helps.  In addition to above [place] is having dysphagia again.  She particularly has a problem swallowing chicken.  At one point someone had to perform the Heimlich maneuver on her at a restaurant.  She had an esophagram with tablet 2 years ago.  Esophagus was normal, barium tablet passed without difficulty.  There was a small hiatal hernia, no spontaneous GERD.  Pharyngeal phase of the study showed evidence of occasional penetration to the level of the vocal cords  Review of systems:     No chest pain, no SOB, no fevers, no urinary sx   Past Medical History:  Diagnosis Date  . Anxiety   . Arthritis    wrist   . Atrial flutter with rapid ventricular response (HCC)    SVT per pt   . Bunion   . Cataract    mild   . Colon polyps   . Dysrhythmia 07/2013   SVT  . Dysthymic disorder   . Enthesopathy of ankle and tarsus, unspecified   . Esophageal reflux   . Family history of malignant neoplasm of gastrointestinal tract   . Hiatal hernia   . Irritable bowel syndrome   . 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[place])   . Other acquired calcaneus deformity   . Personal history of colonic polyps 2002   hyperplastic  . Plantar fascial fibromatosis   . Rotator cuff syndrome of shoulder and allied disorders   . Skin cancer   . Syncope  Patient's surgical history, family medical history, social history, medications and allergies were all reviewed in Epic     Current Outpatient Medications  Medication Sig Dispense Refill  . aspirin EC 81 MG tablet Take 81 mg by mouth daily.    . Calcium Carbonate-Vit D-Min (CALCIUM 600 + MINERALS PO) Take 1 capsule by mouth daily.    . cholecalciferol (VITAMIN D) 1000 units tablet Take 2,000 Units by mouth daily.    . cycloSPORINE (RESTASIS) 0.05 % ophthalmic emulsion 1 drop 2 (two) times daily.    . Omega 3 1200 MG CAPS Fish Oil    . Polyethyl Glycol-Propyl Glycol (SYSTANE OP) Place 2 drops into both eyes at bedtime.    .  valACYclovir (VALTREX) 1000 MG tablet Take 0.5 tablets by mouth 2 (two) times daily as needed. Breakouts  0  . zaleplon (SONATA) 10 MG capsule Take 10 mg by mouth at bedtime as needed for sleep.    Marland Kitchen omeprazole (PRILOSEC) 20 MG capsule Take 1 capsule (20 mg total) by mouth every morning. Take 30 minutes before breakfast 30 capsule 3   Current Facility-Administered Medications  Medication Dose Route Frequency Provider Last Rate Last Dose  . 0.9 %  sodium chloride infusion  500 mL Intravenous Once Ladene Artist, MD        Physical Exam:     BP 98/62 (BP Location: Left Arm, Patient Position: Sitting, Cuff Size: Normal)   Pulse 74   Temp 97.6 F (36.4 C) (Other (Comment))   Ht 5\' 4"  (1.626 m)   Wt 128 lb 2 oz (58.1 kg)   BMI 21.99 kg/m   GENERAL:  Pleasant female in NAD PSYCH: : Cooperative, normal affect EENT:  conjunctiva pink, mucous membranes moist, neck supple without masses CARDIAC:  RRR, , no peripheral edema PULM: Normal respiratory effort, lungs CTA bilaterally, no wheezing ABDOMEN:  Nondistended, soft, nontender. No obvious masses, no hepatomegaly,  normal bowel sounds SKIN:  turgor, no lesions seen Musculoskeletal:  Normal muscle tone, normal strength NEURO: Alert and oriented x 3, no focal neurologic deficits   Tye Savoy , NP 12/12/2018, 4:08 PM

## 2018-12-12 ENCOUNTER — Encounter: Payer: Self-pay | Admitting: Nurse Practitioner

## 2018-12-13 NOTE — Progress Notes (Signed)
Reviewed and agree with management plan.  Jemar Paulsen T. Siddharth Babington, MD FACG [facility]  

## 2018-12-17 ENCOUNTER — Ambulatory Visit (HOSPITAL_COMMUNITY)
Admission: RE | Admit: 2018-12-17 | Discharge: 2018-12-17 | Disposition: A | Discharge status: Home / Self Care | Payer: 59 | Source: Ambulatory Visit | Attending: Nurse Practitioner | Admitting: Nurse Practitioner

## 2018-12-17 ENCOUNTER — Other Ambulatory Visit: Payer: Self-pay

## 2018-12-17 DIAGNOSIS — R079 Chest pain, unspecified: Secondary | ICD-10-CM | POA: Diagnosis present

## 2018-12-17 DIAGNOSIS — R1310 Dysphagia, unspecified: Secondary | ICD-10-CM | POA: Insufficient documentation

## 2018-12-18 ENCOUNTER — Telehealth: Payer: Self-pay | Admitting: Nurse Practitioner

## 2018-12-19 ENCOUNTER — Telehealth: Payer: Self-pay | Admitting: Nurse Practitioner

## 2018-12-19 NOTE — Telephone Encounter (Signed)
See DG imaging report

## 2018-12-22 ENCOUNTER — Other Ambulatory Visit: Payer: Self-pay

## 2018-12-22 ENCOUNTER — Ambulatory Visit (AMBULATORY_SURGERY_CENTER): Payer: Self-pay | Admitting: *Deleted

## 2018-12-22 ENCOUNTER — Encounter: Payer: Self-pay | Admitting: Gastroenterology

## 2018-12-22 VITALS — Temp 96.0°F | Ht 64.0 in | Wt 130.0 lb

## 2018-12-22 DIAGNOSIS — R1310 Dysphagia, unspecified: Secondary | ICD-10-CM

## 2018-12-22 NOTE — Progress Notes (Signed)

## 2018-12-29 ENCOUNTER — Telehealth: Payer: Self-pay

## 2018-12-29 NOTE — Telephone Encounter (Signed)
Covid-19 screening questions   Do you now or have you had a fever in the last 14 days?  Do you have any respiratory symptoms of shortness of breath or cough now or in the last 14 days?  Do you have any family members or close contacts with diagnosed or suspected Covid-19 in the past 14 days?  Have you been tested for Covid-19 and found to be positive?       

## 2018-12-29 NOTE — Telephone Encounter (Signed)
Pt responded "no" to all screening questions °

## 2018-12-30 ENCOUNTER — Encounter: Payer: Self-pay | Admitting: Gastroenterology

## 2018-12-30 ENCOUNTER — Ambulatory Visit (AMBULATORY_SURGERY_CENTER): Payer: 59 | Admitting: Gastroenterology

## 2018-12-30 ENCOUNTER — Other Ambulatory Visit: Payer: Self-pay

## 2018-12-30 VITALS — BP 114/75 | HR 71 | Temp 98.0°F | Resp 13 | Ht 64.0 in | Wt 130.0 lb

## 2018-12-30 DIAGNOSIS — R933 Abnormal findings on diagnostic imaging of other parts of digestive tract: Secondary | ICD-10-CM

## 2018-12-30 DIAGNOSIS — R1310 Dysphagia, unspecified: Secondary | ICD-10-CM | POA: Diagnosis present

## 2018-12-30 DIAGNOSIS — K449 Diaphragmatic hernia without obstruction or gangrene: Secondary | ICD-10-CM

## 2018-12-30 DIAGNOSIS — R079 Chest pain, unspecified: Secondary | ICD-10-CM

## 2018-12-30 MED ORDER — PANTOPRAZOLE SODIUM 40 MG PO TBEC: 40.0000 mg | DELAYED_RELEASE_TABLET | Freq: Two times a day (BID) | ORAL | 4 refills | Status: DC

## 2018-12-30 MED ORDER — FAMOTIDINE 40 MG PO TABS: ORAL_TABLET | ORAL | 2 refills | Status: DC

## 2018-12-30 MED ORDER — SODIUM CHLORIDE 0.9 % IV SOLN: 500.0000 mL | Freq: Once | INTRAVENOUS | Status: DC

## 2018-12-30 NOTE — Patient Instructions (Signed)
FOLLOW DILATATION DIET GIVEN TO YOU TODAY  DISCONTINUE OMEPRAZOLE   PROTONIX AND FAMOTIDINE ORDERED  ANTI REFLUX MEASURES ( ORANGE HANDOUT GIVEN TO YOU )  Return to [place] office in one month to see Dr Fuller Plan    YOU HAD AN ENDOSCOPIC PROCEDURE TODAY AT THE [facility]:   Refer to the procedure report that was given to you for any specific questions about what was found during the examination.  If the procedure report does not answer your questions, please call your gastroenterologist to clarify.  If you requested that your care partner not be given the details of your procedure findings, then the procedure report has been included in a sealed envelope for you to review at your convenience later.  YOU SHOULD EXPECT: Some feelings of bloating in the abdomen. Passage of more gas than usual.  Walking can help get rid of the air that was put into your GI tract during the procedure and reduce the bloating. If you had a lower endoscopy (such as a colonoscopy or flexible sigmoidoscopy) you may notice spotting of blood in your stool or on the toilet paper. If you underwent a bowel prep for your procedure, you may not have a normal bowel movement for a few days.  Please Note:  You might notice some irritation and congestion in your nose or some drainage.  This is from the oxygen used during your procedure.  There is no need for concern and it should clear up in a day or so.  SYMPTOMS TO REPORT IMMEDIATELY:     Following upper endoscopy (EGD)  Vomiting of blood or coffee ground material  New chest pain or pain under the shoulder blades  Painful or persistently difficult swallowing  New shortness of breath  Fever of 100F or higher  Black, tarry-looking stools  For urgent or emergent issues, a gastroenterologist can be reached at any hour by calling [phone].   DIET:  We do recommend a small meal at first, but then you may proceed to your regular diet.  Drink plenty of fluids  but you should avoid alcoholic beverages for 24 hours.  ACTIVITY:  You should plan to take it easy for the rest of today and you should NOT DRIVE or use heavy machinery until tomorrow (because of the sedation medicines used during the test).    FOLLOW UP: Our staff will call the number listed on your records 48-72 hours following your procedure to check on you and address any questions or concerns that you may have regarding the information given to you following your procedure. If we do not reach you, we will leave a message.  We will attempt to reach you two times.  During this call, we will ask if you have developed any symptoms of COVID 19. If you develop any symptoms (ie: fever, flu-like symptoms, shortness of breath, cough etc.) before then, please call [phone].  If you test positive for Covid 19 in the 2 weeks post procedure, please call and report this information to [place].    If any biopsies were taken you will be contacted by phone or by letter within the next 1-3 weeks.  Please call us at [phone] if you have not heard about the biopsies in 3 weeks.    SIGNATURES/CONFIDENTIALITY: You and/or your care partner have signed paperwork which will be entered into your electronic medical record.  These signatures attest to the fact that that the information above on your After Visit  Summary has been reviewed and is understood.  Full responsibility of the confidentiality of this discharge information lies with you and/or your care-partner. 

## 2018-12-30 NOTE — Op Note (Signed)
[facility] Patient Name: [name] Procedure Date: 12/30/2018 10:42 AM MRN: [id number] Endoscopist: Ladene Artist , MD Age: 68 Referring MD:  Date of Birth: September 12, 1950 Gender: Female Account #: [id number] Procedure:                Upper GI endoscopy Indications:              Dysphagia, Abnormal esophagram, Unexplained chest                            pain Medicines:                Monitored Anesthesia Care Procedure:                Pre-Anesthesia Assessment:                           - Prior to the procedure, a History and Physical                            was performed, and patient medications and                            allergies were reviewed. The patient's tolerance of                            previous anesthesia was also reviewed. The risks                            and benefits of the procedure and the sedation                            options and risks were discussed with the patient.                            All questions were answered, and informed consent                            was obtained. Prior Anticoagulants: The patient has                            taken no previous anticoagulant or antiplatelet                            agents. ASA Grade Assessment: II - A patient with                            mild systemic disease. After reviewing the risks                            and benefits, the patient was deemed in                            satisfactory condition to undergo the procedure.  After obtaining informed consent, the endoscope was                            passed under direct vision. Throughout the                            procedure, the patient's blood pressure, pulse, and                            oxygen saturations were monitored continuously. The                            Endoscope was introduced through the mouth, and                            advanced to the second part of duodenum. The upper                             GI endoscopy was accomplished without difficulty.                            The patient tolerated the procedure well. Scope In: Scope Out: Findings:                 No endoscopic abnormality was evident in the                            esophagus to explain the patient's complaint of                            dysphagia. It was decided, however, to proceed with                            dilation of the entire esophagus. A guidewire was                            placed and the scope was withdrawn. Dilation was                            performed with a Savary dilator with no resistance                            at 17 mm. No heme noted.                           A small hiatal hernia was present.                           The exam of the stomach was otherwise normal.                           The duodenal bulb and second portion of the  duodenum were normal. Complications:            No immediate complications. Estimated Blood Loss:     Estimated blood loss: none. Impression:               - No endoscopic esophageal abnormality to explain                            patient's dysphagia. Esophagus dilated.                           - Small hiatal hernia.                           - Normal duodenal bulb and second portion of the                            duodenum.                           - No specimens collected. Recommendation:           - Patient has a contact number available for                            emergencies. The signs and symptoms of potential                            delayed complications were discussed with the                            patient. Return to normal activities tomorrow.                            Written discharge instructions were provided to the                            patient.                           - Clear liquid diet for 2 hours, then advance as                            tolerated to  soft diet today. Resume prior diet                            tomorrow.                           - Antireflux measures long term.                           - Continue present medications.                           - Return to GI office in 1 month with me or PG.                           -  Protonix (pantoprazole) 40 mg PO BID, 1 year of                            refills.                           - Famotidine 40 mg po qhs, #30, 2 refills.                           - Discontinue omeprazole. Ladene Artist, MD 12/30/2018 10:55:48 AM This report has been signed electronically.

## 2018-12-30 NOTE — Progress Notes (Signed)
Temperature- June Bullock VS- Courtney Washington  Pt's states no medical or surgical changes since previsit or office visit.  

## 2019-01-01 ENCOUNTER — Telehealth: Payer: Self-pay

## 2019-01-01 NOTE — Telephone Encounter (Signed)
  Follow up Call-  Call back number 12/30/2018 07/19/2017  Post procedure Call Back phone  # [phone]  Permission to leave phone message Yes Yes  Some recent data might be hidden     Patient questions:  Do you have a fever, pain , or abdominal swelling? No. Pain Score  0 *  Have you tolerated food without any problems? Yes.    Have you been able to return to your normal activities? Yes.    Do you have any questions about your discharge instructions: Diet   No. Medications  No. Follow up visit  No.  Do you have questions or concerns about your Care? No.  Actions: * If pain score is 4 or above: No action needed, pain <4.  1. Have you developed a fever since your procedure? no  2.   Have you had an respiratory symptoms (SOB or cough) since your procedure? no  3.   Have you tested positive for COVID 19 since your procedure no  4.   Have you had any family members/close contacts diagnosed with the COVID 19 since your procedure?  no   If yes to any of these questions please route to Joylene John, RN and Alphonsa Gin, Therapist, sports.

## 2019-02-18 ENCOUNTER — Ambulatory Visit: Payer: 59 | Admitting: Gastroenterology

## 2019-04-27 ENCOUNTER — Ambulatory Visit: Payer: 59

## 2019-05-16 ENCOUNTER — Ambulatory Visit: Payer: 59

## 2019-07-13 ENCOUNTER — Other Ambulatory Visit: Payer: Self-pay | Admitting: Obstetrics and Gynecology

## 2019-07-13 DIAGNOSIS — Z1231 Encounter for screening mammogram for malignant neoplasm of breast: Secondary | ICD-10-CM

## 2019-07-14 ENCOUNTER — Ambulatory Visit (INDEPENDENT_AMBULATORY_CARE_PROVIDER_SITE_OTHER): Payer: Medicare Other | Admitting: Internal Medicine

## 2019-07-14 ENCOUNTER — Other Ambulatory Visit: Payer: Self-pay

## 2019-07-14 ENCOUNTER — Encounter: Payer: Self-pay | Admitting: Internal Medicine

## 2019-07-14 VITALS — BP 98/60 | HR 58 | Ht 64.0 in | Wt 127.0 lb

## 2019-07-14 DIAGNOSIS — R002 Palpitations: Secondary | ICD-10-CM

## 2019-07-14 NOTE — Progress Notes (Signed)
HPI [name] returns today for followup. She is a pleasant 69 yo woman with a h/o SVT who then developed recurrent palpitations. I saw her over 7 months ago and she wore a cardiac monitor which demonstrated PVC's and PAC's, and NS atrial tachycardia. She feels well and is exercising regularly. No anginal symptoms.  No Known Allergies   Current Outpatient Medications  Medication Sig Dispense Refill  . aspirin EC 81 MG tablet Take 81 mg by mouth daily.    . Calcium Carbonate-Vit D-Min (CALCIUM 600 + MINERALS PO) Take 1 capsule by mouth daily.    . cholecalciferol (VITAMIN D) 1000 units tablet Take 2,000 Units by mouth daily.    . cycloSPORINE (RESTASIS) 0.05 % ophthalmic emulsion 1 drop 2 (two) times daily.    Marland Kitchen doxepin (SINEQUAN) 25 MG capsule     . estradiol (CLIMARA - DOSED IN MG/24 HR) 0.025 mg/24hr patch     . Omega 3 1200 MG CAPS Fish Oil    . Polyethyl Glycol-Propyl Glycol (SYSTANE OP) Place 2 drops into both eyes at bedtime.    . valACYclovir (VALTREX) 1000 MG tablet Take 0.5 tablets by mouth 2 (two) times daily as needed. Breakouts  0  . zaleplon (SONATA) 10 MG capsule Take 10 mg by mouth at bedtime as needed for sleep.     No current facility-administered medications for this visit.     Past Medical History:  Diagnosis Date  . Anxiety   . Arthritis    wrist   . Atrial flutter with rapid ventricular response (HCC)    SVT per pt   . Bunion   . Cataract    mild   . Colon polyps   . Dysrhythmia 07/2013   SVT  . Dysthymic disorder   . Enthesopathy of ankle and tarsus, unspecified   . Esophageal reflux   . Family history of malignant neoplasm of gastrointestinal tract   . Hiatal hernia   . Irritable bowel syndrome   . 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[place])   . Other acquired calcaneus deformity   . Personal history of colonic polyps 2002   hyperplastic  . Plantar fascial fibromatosis   . Rotator cuff syndrome of shoulder and allied disorders   . Skin cancer   . Syncope      ROS:   All systems reviewed and negative except as noted in the HPI.   Past Surgical History:  Procedure Laterality Date  . ABLATION OF DYSRHYTHMIC FOCUS  07/31/2013   DR Lovena Le  . ARTHROPLASTY  2006   thumb/wrist   . AUGMENTATION MAMMAPLASTY Bilateral   . BRAVO [place] STUDY  02/01/2011   Procedure: BRAVO [place] STUDY;  Surgeon: Inda Castle, MD;  Location: [facility];  Service: Endoscopy;  Laterality: N/A;  . BUNIONECTOMY    . CESAREAN SECTION     x3  . COLONOSCOPY  07/16/2012  . ESOPHAGOGASTRODUODENOSCOPY  02/01/2011  . PARTIAL HYSTERECTOMY     Still has ovaries  . PLACEMENT OF BREAST IMPLANTS    . POLYPECTOMY    . SHOULDER SURGERY    . SUPRAVENTRICULAR TACHYCARDIA ABLATION N/A 07/31/2013   Procedure: SUPRAVENTRICULAR TACHYCARDIA ABLATION;  Surgeon: Evans Lance, MD;  Location: [facility];  Service: Cardiovascular;  Laterality: N/A;     Family History  Problem Relation Age of Onset  . Colon cancer Father 86  . Heart disease Mother   . Breast cancer Maternal Aunt   . Ovarian cancer Cousin   .  Colon polyps Neg Hx   . Esophageal cancer Neg Hx   . Rectal cancer Neg Hx   . Stomach cancer Neg Hx      Social History   Socioeconomic History  . Marital status: Divorced    Spouse name: Not on file  . Number of children: 3  . Years of education: Not on file  . Highest education level: Not on file  Occupational History  . Occupation: Corporate treasurer: [employer]  Tobacco Use  . Smoking status: Former Smoker    Quit date: 03/19/1978    Years since quitting: 41.3  . Smokeless tobacco: Never Used  Substance and Sexual Activity  . Alcohol use: Not Currently    Comment: rarely- twice a yr if that   . Drug use: No  . Sexual activity: Not on file  Other Topics Concern  . Not on file  Social History Narrative  . Not on file   Social Determinants of Health   Financial Resource Strain:   . Difficulty of Paying Living Expenses:   Food  Insecurity:   . Worried About Charity fundraiser in the Last Year:   . Arboriculturist in the Last Year:   Transportation Needs:   . Film/video editor (Medical):   Marland Kitchen Lack of Transportation (Non-Medical):   Physical Activity:   . Days of Exercise per Week:   . Minutes of Exercise per Session:   Stress:   . Feeling of Stress :   Social Connections:   . Frequency of Communication with Friends and Family:   . Frequency of Social Gatherings with Friends and Family:   . Attends Religious Services:   . Active Member of Clubs or Organizations:   . Attends Archivist Meetings:   Marland Kitchen Marital Status:   Intimate Partner Violence:   . Fear of Current or Ex-Partner:   . Emotionally Abused:   Marland Kitchen Physically Abused:   . Sexually Abused:      BP 98/60   Pulse (!) 58   Ht 5\' 4"  (1.626 m)   Wt 127 lb (57.6 kg)   SpO2 99%   BMI 21.80 kg/m   Physical Exam:  Well appearing NAD HEENT: Unremarkable Neck:  No JVD, no thyromegally Lymphatics:  No adenopathy Back:  No CVA tenderness Lungs:  Clear with no wheezes HEART:  Regular rate rhythm, no murmurs, no rubs, no clicks Abd:  soft, positive bowel sounds, no organomegally, no rebound, no guarding Ext:  2 plus pulses, no edema, no cyanosis, no clubbing Skin:  No rashes no nodules Neuro:  CN II through XII intact, motor grossly intact  EKG - NSR  Assess/Plan: 1. SVT - she has had no recurrence s/p catheter ablation.  2. Palpitations - she has PAC's and PVC's. I discussed the benign nature as well as treatment options if she becomes bothered. I will see her back as needed.   Mikle Bosworth.D.

## 2019-07-14 NOTE — Patient Instructions (Addendum)
Medication Instructions:  Your physician recommends that you continue on your current medications as directed. Please refer to the Current Medication list given to you today.  Labwork: None ordered.  Testing/Procedures: None ordered.  Follow-Up: Your physician wants you to follow-up in: as needed with Dr. Taylor.      Any Other Special Instructions Will Be Listed Below (If Applicable).  If you need a refill on your cardiac medications before your next appointment, please call your pharmacy.   

## 2019-08-27 ENCOUNTER — Ambulatory Visit
Admission: RE | Admit: 2019-08-27 | Discharge: 2019-08-27 | Disposition: A | Discharge status: Home / Self Care | Payer: Medicare Other | Source: Ambulatory Visit | Attending: Obstetrics and Gynecology | Admitting: Obstetrics and Gynecology

## 2019-08-27 ENCOUNTER — Other Ambulatory Visit: Payer: Self-pay

## 2019-08-27 DIAGNOSIS — Z1231 Encounter for screening mammogram for malignant neoplasm of breast: Secondary | ICD-10-CM

## 2019-10-20 ENCOUNTER — Other Ambulatory Visit: Payer: Self-pay | Admitting: Obstetrics and Gynecology

## 2019-10-20 DIAGNOSIS — E2839 Other primary ovarian failure: Secondary | ICD-10-CM

## 2020-01-21 ENCOUNTER — Other Ambulatory Visit: Payer: Self-pay | Admitting: Internal Medicine

## 2020-01-21 ENCOUNTER — Ambulatory Visit
Admission: RE | Admit: 2020-01-21 | Discharge: 2020-01-21 | Disposition: A | Discharge status: Home / Self Care | Payer: Medicare Other | Source: Ambulatory Visit | Attending: Obstetrics and Gynecology | Admitting: Obstetrics and Gynecology

## 2020-01-21 ENCOUNTER — Ambulatory Visit
Admission: RE | Admit: 2020-01-21 | Discharge: 2020-01-21 | Disposition: A | Discharge status: Home / Self Care | Payer: Medicare Other | Source: Ambulatory Visit | Attending: Internal Medicine | Admitting: Internal Medicine

## 2020-01-21 ENCOUNTER — Other Ambulatory Visit: Payer: Self-pay

## 2020-01-21 DIAGNOSIS — M545 Low back pain, unspecified: Secondary | ICD-10-CM

## 2020-01-21 DIAGNOSIS — E2839 Other primary ovarian failure: Secondary | ICD-10-CM

## 2020-01-28 ENCOUNTER — Telehealth: Payer: Self-pay | Admitting: Genetic Counselor

## 2020-01-28 NOTE — Telephone Encounter (Signed)
Received a genetic counseling referral from Dr. Renda Rolls for genetic counseling for melanoma and pancreatic cancer gene. Pt has been cld and scheduled to see Raquel Sarna on 12/2 at 10am. Pt aware toa rrive 20 minutes early.

## 2020-02-02 ENCOUNTER — Encounter: Payer: Self-pay | Admitting: Gastroenterology

## 2020-02-02 ENCOUNTER — Ambulatory Visit (INDEPENDENT_AMBULATORY_CARE_PROVIDER_SITE_OTHER): Payer: Medicare Other | Admitting: Gastroenterology

## 2020-02-02 VITALS — BP 100/70 | HR 68 | Ht 64.0 in | Wt 130.0 lb

## 2020-02-02 DIAGNOSIS — K219 Gastro-esophageal reflux disease without esophagitis: Secondary | ICD-10-CM

## 2020-02-02 DIAGNOSIS — Z148 Genetic carrier of other disease: Secondary | ICD-10-CM | POA: Diagnosis not present

## 2020-02-02 MED ORDER — OMEPRAZOLE 40 MG PO CPDR: 40.0000 mg | DELAYED_RELEASE_CAPSULE | Freq: Every day | ORAL | 11 refills | Status: DC

## 2020-02-02 NOTE — Progress Notes (Signed)
    History of Present Illness: This is a 69 year old female returning to review genetic testing through her GYN office by [facility].  In addition she complains of frequent throat clearing.  She discontinued omeprazole after several months when her nocturnal reflux symptoms were under control.  She is no longer on acid reducing medications.  Recommendations were made due to identified CDKN2A mutation.  Myriad impression was this patient has melanoma - pancreatic cancer syndrome and should be considered for enrollment in a pancreatic cancer screening protocol. Denies weight loss, abdominal pain, constipation, diarrhea, change in stool caliber, melena, hematochezia, nausea, vomiting, dysphagia, chest pain.   EGD 12/2018 No endoscopic esophageal abnormality to explain patient's dysphagia. Esophagus dilated. Small hiatal hernia. Normal duodenal bulb and second portion of the duodenum.  Current Medications, Allergies, Past Medical History, Past Surgical History, Family History and Social History were reviewed in [employer] record.   Physical Exam: General: Well developed, well nourished, no acute distress Head: Normocephalic and atraumatic Eyes:  sclerae anicteric, EOMI Ears: Normal auditory acuity Mouth: Not examined, mask on during Covid-19 pandemic Lungs: Clear throughout to auscultation Heart: Regular rate and rhythm; no murmurs, rubs or bruits Abdomen: Soft, non tender and non distended. No masses, hepatosplenomegaly or hernias noted. Normal Bowel sounds Rectal: Not done Musculoskeletal: Symmetrical with no gross deformities  Pulses:  Normal pulses noted Extremities: No clubbing, cyanosis, edema or deformities noted Neurological: Alert oriented x 4, grossly nonfocal Psychological:  Alert and cooperative. Normal mood and affect   Assessment and Recommendations:  1. GERD with suspected LPR. Nonspecific esophageal dysmotility disorder.  Follow antireflux measures  long term and resume omeprazole 40 mg p.o. daily for at least 3 months and assess response. REV in 3 months.   2.   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  We discussed strategies for pancreatic cancer screening in high risk individuals to include MRI/MRCP or endoscopic ultrasound annually.  We discussed referral to a tertiary center with an established pancreatic cancer screening protocol.  Follow-up with [place] genetics counselor as planned in a couple weeks.  Await their evaluation, impression before a final decision regarding pancreatic cancer screening.  3.  Family history of colon cancer, first-degree relative.  Colonoscopy in May 2019 was normal except for hemorrhoids.  Repeat screening colonoscopy recommended in May 2024.

## 2020-02-02 NOTE — Patient Instructions (Signed)
We have sent the following medications to your pharmacy for you to pick up at your convenience: omeprazole.   Please call our office after you have seen the genetic counselor.   Thank you for choosing me and [facility].  Pricilla Riffle. Dagoberto Ligas., MD., Marval Regal

## 2020-02-10 ENCOUNTER — Other Ambulatory Visit: Payer: Self-pay

## 2020-02-10 ENCOUNTER — Emergency Department (HOSPITAL_COMMUNITY): Payer: Medicare Other

## 2020-02-10 ENCOUNTER — Encounter (HOSPITAL_COMMUNITY): Payer: Self-pay

## 2020-02-10 ENCOUNTER — Emergency Department (HOSPITAL_COMMUNITY)
Admission: EM | Admit: 2020-02-10 | Discharge: 2020-02-10 | Disposition: A | Discharge status: Home / Self Care | Payer: Medicare Other | Attending: Emergency Medicine | Admitting: Emergency Medicine

## 2020-02-10 DIAGNOSIS — Y9321 Activity, ice skating: Secondary | ICD-10-CM | POA: Diagnosis not present

## 2020-02-10 DIAGNOSIS — W010XXA Fall on same level from slipping, tripping and stumbling without subsequent striking against object, initial encounter: Secondary | ICD-10-CM | POA: Insufficient documentation

## 2020-02-10 DIAGNOSIS — Y92330 Ice skating rink (indoor) (outdoor) as the place of occurrence of the external cause: Secondary | ICD-10-CM | POA: Diagnosis not present

## 2020-02-10 DIAGNOSIS — Z87891 Personal history of nicotine dependence: Secondary | ICD-10-CM | POA: Diagnosis not present

## 2020-02-10 DIAGNOSIS — Z7982 Long term (current) use of aspirin: Secondary | ICD-10-CM | POA: Insufficient documentation

## 2020-02-10 DIAGNOSIS — S0990XA Unspecified injury of head, initial encounter: Secondary | ICD-10-CM | POA: Insufficient documentation

## 2020-02-10 DIAGNOSIS — Z96639 Presence of unspecified artificial wrist joint: Secondary | ICD-10-CM | POA: Diagnosis not present

## 2020-02-10 DIAGNOSIS — Z85828 Personal history of other malignant neoplasm of skin: Secondary | ICD-10-CM | POA: Diagnosis not present

## 2020-02-10 DIAGNOSIS — R519 Headache, unspecified: Secondary | ICD-10-CM | POA: Diagnosis present

## 2020-02-10 MED ORDER — HYDROCODONE-ACETAMINOPHEN 5-325 MG PO TABS
1.0000 | ORAL_TABLET | Freq: Once | ORAL | Status: AC
  Administered 2020-02-10: 1 via ORAL
  Filled 2020-02-10: qty 1

## 2020-02-10 MED ORDER — FENTANYL CITRATE (PF) 100 MCG/2ML IJ SOLN: 50.0000 ug | Freq: Once | INTRAMUSCULAR | Status: DC

## 2020-02-10 NOTE — ED Triage Notes (Signed)
Per [facility]: Pt fell while ice skating, fell, hit back of head. Has a hematoma present. No LOC, pupils equal. 8/10 pain scale. Pt ambulatory on scene with EMS.

## 2020-02-10 NOTE — ED Provider Notes (Addendum)
[facility] EMERGENCY DEPARTMENT Provider Note   CSN: [id number] Arrival date & time: 02/10/20  1305     History Chief Complaint  Patient presents with  . [place] is a 69 y.o. female presented to follow head injury, she was ice skating when she slipped falling backwards striking the back of her head on the ice.  She thinks she may have lost consciousness for a few seconds.  She reports posterior headache constant nonradiating throbbing in nature, moderate in intensity is been present since her fall.  She noticed some swelling to the back of her head as well.  Reports daily baby aspirin no other blood thinner use.  Denies vision changes, nausea/vomiting, neck pain, back pain, chest pain, abdominal pain, pelvic pain, pain of the extremities, numbness/tingling, weakness, vision changes or any additional concerns.  HPI     Past Medical History:  Diagnosis Date  . Anxiety   . Arthritis    wrist   . Atrial flutter with rapid ventricular response (HCC)    SVT per pt   . Bunion   . Cataract    mild   . Colon polyps   . Dysrhythmia 07/2013   SVT  . Dysthymic disorder   . Enthesopathy of ankle and tarsus, unspecified   . Esophageal reflux   . Family history of malignant neoplasm of gastrointestinal tract   . Hiatal hernia   . Irritable bowel syndrome   . 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[place])   . Other acquired calcaneus deformity   . Personal history of colonic polyps 2002   hyperplastic  . Plantar fascial fibromatosis   . Rotator cuff syndrome of shoulder and allied disorders   . Skin cancer   . Syncope     Patient Active Problem List   Diagnosis Date Noted  . Palpitations 10/14/2018  . Abdominal pain, chronic, epigastric 09/13/2016  . Constipation 09/13/2016  . Piriformis syndrome of left side 09/10/2014  . Loss of transverse plantar arch of left foot 08/18/2014  . Dyspnea on exertion 04/13/2014  . Lower abdominal pain 01/15/2014  . Paroxysmal  supraventricular tachycardia ([place]) 07/31/2013  . SVT (supraventricular tachycardia) ([place]) 07/23/2013  . Raynauds syndrome 03/19/2011  . Raynaud phenomenon 02/16/2011  . Chronic chest pain 02/16/2011  . Dysphagia 01/23/2011  . Motility disorder, esophageal 12/29/2010  . Atypical chest pain 12/29/2010  . Chest pain, atypical 12/14/2010  . Esophageal motility disorder 12/14/2010  . History of IBS 12/14/2010  . Anxiety associated with depression 12/14/2010  . Chest pain 11/03/2010  . Gastroesophageal reflux disease 11/03/2010  . IBS (irritable bowel syndrome) 11/03/2010  . ADD (attention deficit disorder) 11/03/2010  . Anxiety disorder 11/03/2010  . Family history of malignant neoplasm of gastrointestinal tract 11/03/2010  . ANXIETY DEPRESSION 07/04/2007  . GERD 07/04/2007  . IBS 07/04/2007  . Enthesopathy of ankle and tarsus 04/30/2007  . ACHILLES TENDINITIS 04/30/2007  . BUNIONS, BILATERAL 04/30/2007  . PLANTAR FASCIITIS, RIGHT 04/30/2007  . OTHER ACQUIRED CALCANEUS DEFORMITY 04/30/2007  . COLONIC POLYPS 11/15/2000    Past Surgical History:  Procedure Laterality Date  . ABLATION OF DYSRHYTHMIC FOCUS  07/31/2013   DR Lovena Le  . ARTHROPLASTY  2006   thumb/wrist   . AUGMENTATION MAMMAPLASTY Bilateral   . BRAVO [place] STUDY  02/01/2011   Procedure: BRAVO [place] STUDY;  Surgeon: Inda Castle, MD;  Location: [facility];  Service: Endoscopy;  Laterality: N/A;  . BUNIONECTOMY    . CESAREAN SECTION  x3  . COLONOSCOPY  07/16/2012  . ESOPHAGOGASTRODUODENOSCOPY  02/01/2011  . PARTIAL HYSTERECTOMY     Still has ovaries  . PLACEMENT OF BREAST IMPLANTS    . POLYPECTOMY    . SHOULDER SURGERY    . SUPRAVENTRICULAR TACHYCARDIA ABLATION N/A 07/31/2013   Procedure: SUPRAVENTRICULAR TACHYCARDIA ABLATION;  Surgeon: Evans Lance, MD;  Location: [facility] CATH LAB;  Service: Cardiovascular;  Laterality: N/A;     OB History   No obstetric history on file.     Family History  Problem Relation  Age of Onset  . Colon cancer Father 12  . Heart disease Mother   . Breast cancer Maternal Aunt   . Ovarian cancer Cousin   . Lung cancer Maternal Grandfather   . Colon polyps Neg Hx   . Esophageal cancer Neg Hx   . Rectal cancer Neg Hx   . Stomach cancer Neg Hx     Social History   Tobacco Use  . Smoking status: Former Smoker    Types: Cigarettes    Quit date: 03/19/1978    Years since quitting: 41.9  . Smokeless tobacco: Never Used  Vaping Use  . Vaping Use: Never used  Substance Use Topics  . Alcohol use: Not Currently    Comment: rarely- twice a yr if that   . Drug use: No    Home Medications Prior to Admission medications   Medication Sig Start Date End Date Taking? Authorizing Provider  aspirin EC 81 MG tablet Take 81 mg by mouth daily.   Yes [provider]  Calcium Carbonate-Vit D-Min (CALCIUM 600 + MINERALS PO) Take 1 capsule by mouth daily.   Yes [provider]  cholecalciferol (VITAMIN D) 1000 units tablet Take 2,000 Units by mouth daily.   Yes [provider]  cycloSPORINE (RESTASIS) 0.05 % ophthalmic emulsion 1 drop 2 (two) times daily.   Yes [provider]  doxepin (SINEQUAN) 25 MG capsule Take 25 mg by mouth at bedtime.  06/30/18  Yes [provider]  estradiol (CLIMARA - DOSED IN MG/24 HR) 0.025 mg/24hr patch Place 0.025 mg onto the skin once a week.  09/21/18  Yes [provider]  Omega 3 1200 MG CAPS Take 2 capsules by mouth daily.    Yes [provider]  omeprazole (PRILOSEC) 40 MG capsule Take 1 capsule (40 mg total) by mouth daily. 02/02/20  Yes Ladene Artist, MD  Polyethyl Glycol-Propyl Glycol (SYSTANE OP) Place 2 drops into both eyes daily as needed (dry eyes).    Yes [provider]  valACYclovir (VALTREX) 1000 MG tablet Take 0.5 tablets by mouth 2 (two) times daily as needed. Breakouts 03/11/14  Yes [provider]  zaleplon (SONATA) 10 MG capsule Take 10 mg by mouth at  bedtime as needed for sleep.   Yes [provider]    Allergies    Patient has no known allergies.  Review of Systems   Review of Systems Ten systems are reviewed and are negative for acute change except as noted in the HPI  Physical Exam Updated Vital Signs BP (!) 158/117 (BP Location: Right Arm)   Pulse (!) 53   Temp 98.1 F (36.7 C) (Oral)   Resp 19   SpO2 95%   Physical Exam Constitutional:      General: She is not in acute distress.    Appearance: Normal appearance. She is well-developed. She is not ill-appearing or diaphoretic.  HENT:     Head: Normocephalic. No  raccoon eyes or Battle's sign.     Jaw: There is normal jaw occlusion. No trismus.      Comments: 2 cm diameter hematoma.    Right Ear: External ear normal. No hemotympanum.     Left Ear: External ear normal. No hemotympanum.     Nose: Nose normal.     Mouth/Throat:     Mouth: Mucous membranes are moist.     Pharynx: Oropharynx is clear.     Comments: No dental injury Eyes:     General: Vision grossly intact. Gaze aligned appropriately.     Extraocular Movements: Extraocular movements intact.     Conjunctiva/sclera: Conjunctivae normal.     Pupils: Pupils are equal, round, and reactive to light.  Neck:     Trachea: Trachea and phonation normal. No tracheal tenderness or tracheal deviation.  Pulmonary:     Effort: Pulmonary effort is normal. No respiratory distress.     Breath sounds: Normal air entry.  Chest:     Chest wall: No deformity or tenderness.  Abdominal:     General: There is no distension.     Palpations: Abdomen is soft.     Tenderness: There is no abdominal tenderness. There is no guarding or rebound.  Musculoskeletal:        General: Normal range of motion.     Cervical back: Normal range of motion. No spinous process tenderness or muscular tenderness.     Comments: No midline C/T/L spinal tenderness to palpation, no paraspinal muscle tenderness, no deformity, crepitus, or  step-off noted. No sign of injury to the neck or back.  Pelvis stable to compression bilateral without pain.  No pain with knee-to-chest bilaterally.  All major joints of the bilateral upper and lower extremities mobilized without pain or deformity.  Skin:    General: Skin is warm and dry.  Neurological:     Mental Status: She is alert.     GCS: GCS eye subscore is 4. GCS verbal subscore is 5. GCS motor subscore is 6.     Comments: Speech is clear and goal oriented, follows commands Major Cranial nerves without deficit, no facial droop Moves extremities without ataxia, coordination intact Steady gait without assistance  Psychiatric:        Behavior: Behavior normal.     ED Results / Procedures / Treatments   Labs (all labs ordered are listed, but only abnormal results are displayed) Labs Reviewed - No data to display  EKG None  Radiology CT Head Wo Contrast  Result Date: 02/10/2020 CLINICAL DATA:  Trauma.  Fall while ice skating. EXAM: CT HEAD WITHOUT CONTRAST CT CERVICAL SPINE WITHOUT CONTRAST TECHNIQUE: Multidetector CT imaging of the head and cervical spine was performed following the standard protocol without intravenous contrast. Multiplanar CT image reconstructions of the cervical spine were also generated. COMPARISON:  None. FINDINGS: CT HEAD FINDINGS Brain: No evidence of acute infarction, hemorrhage, hydrocephalus, extra-axial collection or mass lesion/mass effect. Vascular: No hyperdense vessel or unexpected calcification. Skull: High posterior right frontal scalp contusion without acute fracture. Sinuses/Orbits: No acute finding. Other: No mastoid effusions. Severe bilateral TMJ degenerative change. CT CERVICAL SPINE FINDINGS Alignment: Mild retrolisthesis of C4 on C5 and mild anterolisthesis of T1 on T2,, favor degenerative given degenerative change at these levels. Skull base and vertebrae: No acute fracture. Vertebral body heights are maintained. Soft tissues and spinal  canal: No prevertebral fluid or swelling. No visible canal hematoma. Disc levels: Severe degenerative disc disease at C4-C5 with severe disc height  loss, endplate sclerosis, and endplate spurring. Moderate degenerative disc disease at C6-C7. No evidence of significant bony canal stenosis. Upper chest: No acute findings.  Patulous esophagus. IMPRESSION: CT head: 1. No evidence of acute intracranial abnormality. 2. High posterior right scalp contusion without acute fracture. 3. Severe bilateral TMJ degenerative change. CT cervical spine: 1. No evidence of acute fracture or malalignment. 2. Degenerative disc disease, which is severe at C4-C5 and moderate at C6-C7. Electronically Signed   By: Margaretha Sheffield MD   On: 02/10/2020 15:13   CT Cervical Spine Wo Contrast  Result Date: 02/10/2020 CLINICAL DATA:  Trauma.  Fall while ice skating. EXAM: CT HEAD WITHOUT CONTRAST CT CERVICAL SPINE WITHOUT CONTRAST TECHNIQUE: Multidetector CT imaging of the head and cervical spine was performed following the standard protocol without intravenous contrast. Multiplanar CT image reconstructions of the cervical spine were also generated. COMPARISON:  None. FINDINGS: CT HEAD FINDINGS Brain: No evidence of acute infarction, hemorrhage, hydrocephalus, extra-axial collection or mass lesion/mass effect. Vascular: No hyperdense vessel or unexpected calcification. Skull: High posterior right frontal scalp contusion without acute fracture. Sinuses/Orbits: No acute finding. Other: No mastoid effusions. Severe bilateral TMJ degenerative change. CT CERVICAL SPINE FINDINGS Alignment: Mild retrolisthesis of C4 on C5 and mild anterolisthesis of T1 on T2,, favor degenerative given degenerative change at these levels. Skull base and vertebrae: No acute fracture. Vertebral body heights are maintained. Soft tissues and spinal canal: No prevertebral fluid or swelling. No visible canal hematoma. Disc levels: Severe degenerative disc disease at C4-C5  with severe disc height loss, endplate sclerosis, and endplate spurring. Moderate degenerative disc disease at C6-C7. No evidence of significant bony canal stenosis. Upper chest: No acute findings.  Patulous esophagus. IMPRESSION: CT head: 1. No evidence of acute intracranial abnormality. 2. High posterior right scalp contusion without acute fracture. 3. Severe bilateral TMJ degenerative change. CT cervical spine: 1. No evidence of acute fracture or malalignment. 2. Degenerative disc disease, which is severe at C4-C5 and moderate at C6-C7. Electronically Signed   By: Margaretha Sheffield MD   On: 02/10/2020 15:13    Procedures Procedures (including critical care time)  Medications Ordered in ED Medications  HYDROcodone-acetaminophen (NORCO/VICODIN) 5-325 MG per tablet 1 tablet (1 tablet Oral Given 02/10/20 1432)    ED Course  I have reviewed the triage vital signs and the nursing notes.  Pertinent labs & imaging results that were available during my care of the patient were reviewed by me and considered in my medical decision making (see chart for details).    MDM Rules/Calculators/A&P                         Additional history obtained from: 1. Nursing notes from this visit. 2. Family have bedside. --------------- 69 year old female presents after mechanical fall while ice skating she has a posterior scalp hematoma questionable loss of consciousness and is on 1 baby aspirin daily.  She reports posterior headache as well.  Will obtain CT head/C-spine and give pain medication.  CT Head/Cspine:  IMPRESSION:  CT head:    1. No evidence of acute intracranial abnormality.  2. High posterior right scalp contusion without acute fracture.  3. Severe bilateral TMJ degenerative change.    CT cervical spine:    1. No evidence of acute fracture or malalignment.  2. Degenerative disc disease, which is severe at C4-C5 and moderate  at C6-C7.  ---- Patient reassessed resting comfortably no  acute distress daughter at bedside.  Reports she is feeling improved would like to be discharged.  Discussed CT findings including degenerative changes with patient and her daughter they state understanding.  All other major joints palpated mobilized with proper range of motion and strength for age without pain or deformity.  No indication for further imaging or work-up at this time will discharge with PCP follow-up and head injury precautions..  At this time there does not appear to be any evidence of an acute emergency medical condition and the patient appears stable for discharge with appropriate outpatient follow up. Diagnosis was discussed with patient who verbalizes understanding of care plan and is agreeable to discharge. I have discussed return precautions with patient and daughter who verbalizes understanding. Patient encouraged to follow-up with their PCP. All questions answered.  Patient seen and evaluated by Dr. Zenia Resides during this visit who agrees with discharged at this time.  Note: Portions of this report may have been transcribed using voice recognition software. Every effort was made to ensure accuracy; however, inadvertent computerized transcription errors may still be present. Final Clinical Impression(s) / ED Diagnoses Final diagnoses:  Injury of head, initial encounter    Rx / DC Orders ED Discharge Orders    None       Deliah Boston, PA-C 02/10/20 1525    Gari Crown 02/10/20 1539    Lacretia Leigh, MD 02/11/20 1651

## 2020-02-10 NOTE — Discharge Instructions (Addendum)
At this time there does not appear to be the presence of an emergent medical condition, however there is always the potential for conditions to change. Please read and follow the below instructions.  Please return to the Emergency Department immediately for any new or worsening symptoms. Please be sure to follow up with your Primary Care Provider within one week regarding your visit today; please call their office to schedule an appointment even if you are feeling better for a follow-up visit.   Go to the nearest Emergency Department immediately if: You have fever or chills You have: A very bad headache that is not helped by medicine. Trouble walking or weakness in your arms and legs. Clear or bloody fluid coming from your nose or ears. Changes in how you see (vision). Shaking movements that you cannot control. You lose your balance. You vomit. The black centers of your eyes (pupils) change in size. Your speech is slurred. Your dizziness gets worse. You pass out. You are sleepier than normal and have trouble staying awake. You have any new/concerning or worsening of symptoms  Please read the additional information packets attached to your discharge summary.  Do not take your medicine if  develop an itchy rash, swelling in your mouth or lips, or difficulty breathing; call 911 and seek immediate emergency medical attention if this occurs.  You may review your lab tests and imaging results in their entirety on your MyChart account.  Please discuss all results of fully with your primary care provider and other specialist at your follow-up visit.  Note: Portions of this text may have been transcribed using voice recognition software. Every effort was made to ensure accuracy; however, inadvertent computerized transcription errors may still be present.

## 2020-02-18 ENCOUNTER — Other Ambulatory Visit: Payer: Self-pay

## 2020-02-18 ENCOUNTER — Inpatient Hospital Stay: Payer: Medicare Other | Attending: Emergency Medicine | Admitting: Genetic Counselor

## 2020-02-18 DIAGNOSIS — Z801 Family history of malignant neoplasm of trachea, bronchus and lung: Secondary | ICD-10-CM

## 2020-02-18 DIAGNOSIS — Z803 Family history of malignant neoplasm of breast: Secondary | ICD-10-CM

## 2020-02-18 DIAGNOSIS — Z8041 Family history of malignant neoplasm of ovary: Secondary | ICD-10-CM

## 2020-02-18 DIAGNOSIS — Z800 Family history of malignant neoplasm of digestive organs: Secondary | ICD-10-CM

## 2020-02-18 DIAGNOSIS — Z85828 Personal history of other malignant neoplasm of skin: Secondary | ICD-10-CM

## 2020-02-18 DIAGNOSIS — Z85820 Personal history of malignant melanoma of skin: Secondary | ICD-10-CM

## 2020-02-18 DIAGNOSIS — Z1509 Genetic susceptibility to other malignant neoplasm: Secondary | ICD-10-CM

## 2020-02-19 ENCOUNTER — Encounter: Payer: Self-pay | Admitting: Genetic Counselor

## 2020-02-19 DIAGNOSIS — Z1501 Genetic susceptibility to malignant neoplasm of breast: Secondary | ICD-10-CM | POA: Insufficient documentation

## 2020-02-19 DIAGNOSIS — Z8041 Family history of malignant neoplasm of ovary: Secondary | ICD-10-CM | POA: Insufficient documentation

## 2020-02-19 DIAGNOSIS — Z85820 Personal history of malignant melanoma of skin: Secondary | ICD-10-CM | POA: Insufficient documentation

## 2020-02-19 DIAGNOSIS — Z801 Family history of malignant neoplasm of trachea, bronchus and lung: Secondary | ICD-10-CM | POA: Insufficient documentation

## 2020-02-19 DIAGNOSIS — Z85828 Personal history of other malignant neoplasm of skin: Secondary | ICD-10-CM | POA: Insufficient documentation

## 2020-02-19 DIAGNOSIS — Z803 Family history of malignant neoplasm of breast: Secondary | ICD-10-CM | POA: Insufficient documentation

## 2020-02-19 NOTE — Progress Notes (Signed)
GENETIC TEST RESULTS   Patient Name: [name] Patient Age: 69 y.o. Encounter Date: 02/18/2020  REFERRING PROVIDER: Devra Dopp, MD [place], [place],  [place] [address]  PRIMARY PROVIDER:  Josetta Huddle, MD  PRIMARY REASON FOR VISIT:  1. Monoallelic mutation of [id number] gene   2. Personal history of malignant melanoma   3. Personal history of squamous cell carcinoma of skin   4. Family history of breast cancer   5. Family history of ovarian cancer   6. Family history of lung cancer   7. Family history of malignant neoplasm of gastrointestinal tract      HISTORY OF PRESENT ILLNESS:   [name], a 69 y.o. female, was seen for a [place] cancer genetics consultation at the request of Dr. 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  [name] presents to clinic today to discuss the possibility of a hereditary predisposition to cancer, genetic testing, and to further clarify her future cancer risks, as well as potential cancer risks for family members.   [name] recently had a melanoma removed from her left shoulder at the age of 55. She also has a history of squamous cell carcinoma removed from her chest "several years ago".   Genetic testing was ordered by Dr. Allyn Kenner at [facility] through the [facility] hereditary cancer gene panel offered by [facility]. 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  RISK FACTORS:  Menarche was at age 24.  First live birth at age 83.  OCP use for approximately 5 years, on and off.  Ovaries intact: yes.  Hysterectomy: yes, 1994.  Menopausal status: postmenopausal.  HRT use: 16 years. Colonoscopy: yes; less than 5 polyps, per patient. Mammogram within the last year: yes. Number of breast biopsies: 0. Any  excessive radiation exposure in the past: no   Past Medical History:  Diagnosis Date  . Anxiety   . Arthritis    wrist   . Atrial flutter with rapid ventricular response (HCC)    SVT per pt   . Bunion   . Cataract    mild   . Colon polyps   . Dysrhythmia 07/2013   SVT  . Dysthymic disorder   . Enthesopathy of ankle and tarsus, unspecified   . Esophageal reflux   . Family history of breast cancer   . Family history of lung cancer   . Family history of malignant neoplasm of gastrointestinal tract   . Family history of ovarian cancer   . Hiatal hernia   . Irritable bowel syndrome   . Monoallelic mutation of ALPF7T gene   . 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[place])   . Other acquired calcaneus deformity   . Personal history of colonic polyps 2002   hyperplastic  . Personal history of malignant melanoma   . Personal history of squamous cell carcinoma of skin   . Plantar fascial fibromatosis   . Rotator cuff syndrome of shoulder and allied disorders   . Skin cancer   . Syncope     Past Surgical History:  Procedure Laterality Date  . ABLATION OF DYSRHYTHMIC FOCUS  07/31/2013   DR Lovena Le  . ARTHROPLASTY  2006   thumb/wrist   . AUGMENTATION MAMMAPLASTY Bilateral   . BRAVO [place] STUDY  02/01/2011   Procedure:  BRAVO [place] STUDY;  Surgeon: Inda Castle, MD;  Location: [facility];  Service: Endoscopy;  Laterality: N/A;  . BUNIONECTOMY    . CESAREAN SECTION     x3  . COLONOSCOPY  07/16/2012  . ESOPHAGOGASTRODUODENOSCOPY  02/01/2011  . PARTIAL HYSTERECTOMY     Still has ovaries  . PLACEMENT OF BREAST IMPLANTS    . POLYPECTOMY    . SHOULDER SURGERY    . SUPRAVENTRICULAR TACHYCARDIA ABLATION N/A 07/31/2013   Procedure: SUPRAVENTRICULAR TACHYCARDIA ABLATION;  Surgeon: Evans Lance, MD;  Location: [facility] CATH LAB;  Service: Cardiovascular;  Laterality: N/A;    Social History   Socioeconomic History  . Marital status: Divorced    Spouse name: Not on file  . Number of children: 3  . Years of  education: Not on file  . Highest education level: Not on file  Occupational History  . Occupation: Corporate treasurer: [employer]  Tobacco Use  . Smoking status: Former Smoker    Types: Cigarettes    Quit date: 03/19/1978    Years since quitting: 41.9  . Smokeless tobacco: Never Used  Vaping Use  . Vaping Use: Never used  Substance and Sexual Activity  . Alcohol use: Not Currently    Comment: rarely- twice a yr if that   . Drug use: No  . Sexual activity: Not Currently  Other Topics Concern  . Not on file  Social History Narrative  . Not on file   Social Determinants of Health   Financial Resource Strain:   . Difficulty of Paying Living Expenses: Not on file  Food Insecurity:   . Worried About Charity fundraiser in the Last Year: Not on file  . Ran Out of Food in the Last Year: Not on file  Transportation Needs:   . Lack of Transportation (Medical): Not on file  . Lack of Transportation (Non-Medical): Not on file  Physical Activity:   . Days of Exercise per Week: Not on file  . Minutes of Exercise per Session: Not on file  Stress:   . Feeling of Stress : Not on file  Social Connections:   . Frequency of Communication with Friends and Family: Not on file  . Frequency of Social Gatherings with Friends and Family: Not on file  . Attends Religious Services: Not on file  . Active Member of Clubs or Organizations: Not on file  . Attends Archivist Meetings: Not on file  . Marital Status: Not on file     FAMILY HISTORY:  We obtained a detailed, 4-generation family history.  Significant diagnoses are listed below: Family History  Problem Relation Age of Onset  . Colon cancer Father 20  . Heart disease Mother   . Breast cancer Maternal Aunt        dx 80s  . Lung cancer Maternal Aunt        dx 58s, smoker  . Ovarian cancer Cousin        dx 75s, maternal first cousin  . Lung cancer Maternal Grandfather        dx 43s, smoker  .  Colon cancer Maternal Uncle        dx 63s  . Lung cancer Other        MGF's brother  . Colon cancer Nephew 47  . Dementia Maternal Aunt   . Breast cancer Cousin        dx 82s, maternal first cousin  . Ovarian  cancer Cousin 26       dysgerminoma, maternal first cousin  . Lung cancer Other        MGF's brother  . Colon polyps Neg Hx   . Esophageal cancer Neg Hx   . Rectal cancer Neg Hx   . Stomach cancer Neg Hx    [name] has two sons and one daughter (ages 34-42). Her daughter has had genetic testing through the [facility] panel, which was negative. [name] has one sister and had one brother. She has a nephew who was recently diagnosed with colon cancer at the age of 50.   Ms. Coletta mother died at age 28 and did not have cancer. [name] had four maternal aunts and three maternal uncles. One aunt had breast cancer diagnosed in her 65s and lung cancer diagnosed in her 50s. One uncle had colon cancer diagnosed in his 80s. One cousin had breast cancer diagnosed in her early 50s, another cousin had ovarian cancer diagnosed in her early 65s, and a third cousin had ovarian cancer (dysgerminoma) diagnosed at age 76. [name] maternal grandmother died at age 83 without cancer, and her maternal grandfather died in his 52s from lung cancer. She notes that her maternal grandfather had two brothers who also had lung cancer.   [name] father died at age 54 and had a history of colon cancer diagnosed at age 31. She had one paternal aunt and one paternal uncle. Her paternal grandmother died at age 72, and her paternal grandfather died older than 109. There are no other known diangoses of cancer on the paternal side of the family.  [name] is unaware of previous family history of genetic testing for hereditary cancer risks. Patient's ancestors are of unknown descent. There is no reported Ashkenazi Jewish ancestry. There is no known consanguinity.  GENETIC TESTING:  [name] genetic  testing through the [facility] Hereditary Cancer gene panel offered by [employer] reported out on 11/09/2019.   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  The [facility] gene panel offered by [employer] includes sequencing and deletion/duplication testing of the following 35 genes: APC, ATM, AXIN2, BARD1, BMPR1A, BRCA1, BRCA2, BRIP1, CHD1, CDK4, CDKN2A, CHEK2, EPCAM (large rearrangement only), HOXB13, (sequencing only), GALNT12, MLH1, MSH2, MSH3 (excluding repetitive portions of exon 1), MSH6, MUTYH, NBN, NTHL1, PALB2, PMS2, PTEN, RAD51C, RAD51D, RNF43, RPS20, SMAD4, STK11, and TP53. Sequencing was performed for select regions of POLE and POLD1, and large rearrangement analysis was performed for select regions of GREM1. A copy of the test report will be scanned into Epic and located under the Molecular Pathology section of the Results Review tab.  A portion of the result report is included below for reference.     Genetic testing also identified a variant of uncertain significance (VUS) in the GALNT12 gene called c.1447G>A (p.Gly483Ser).  At this time, it is unknown if this variant is associated with increased cancer risk or if this is a normal finding, but most variants such as this get reclassified to being inconsequential. It should not be used to make medical management decisions. With time, we suspect the lab will determine the significance of this variant, if any. It is important for [name] to stay in touch with [place] periodically and keep the address and phone number up to date.  [name] genetic test results do not explain  why she has a family history of colon, breast, and ovarian cancer. We discussed with [name] that because current genetic testing is not perfect, it is possible there may be a gene  mutation in one of these genes that current testing cannot detect, but that chance is small.  We also discussed that there could be another gene that has not yet been discovered, or that we have not yet tested, that is responsible for the cancer diagnoses in the family. It is also possible there is a hereditary cause for the cancer in the family that [name] did not inherit and therefore was not identified in her testing. Therefore, it is important to remain in touch with cancer genetics in the future so that we can continue to offer [name] the most up to date genetic testing.  CDKN2A CANCER RISKS:  Studies have shown that individuals with a CDKN2A gene variant that impacts the p16INK4a protein, such as the variant found in [name], have an increased risk for melanoma and pancreatic cancer. The lifetime risk to develop melanoma has been reported to range from 28-67%. The lifetime risk to develop pancreatic cancer has been reported to be up to 17%. Of note, these cancer risks may vary between families and have also been shown to vary depending on environmental risk factors, such as geography and smoking history.    Some families with CDKN2A variants include individuals with both melanoma and pancreatic cancer; however, some may present with only melanoma or pancreatic cancer (PMID: 56314970). Individuals with pathogenic CDKN2A variants may develop multiple primary melanomas (PMID: 26378588), and some studies have also reported a younger age at [place] diagnosis (PMID: 50277412, 87867672). The term familial atypical mole-malignant melanoma syndrome (FAMMM) may be used to refer to individuals with melanoma-pancreatic cancer syndrome who also have multiple atypical nevi (PMID: 09470962, 83662947, 65465035).   MANAGEMENT:  Both malignant melanoma and pancreatic cancer can be difficult to treat and are associated with high mortality if not identified in early stages. While there are no established screening  guidelines for individuals with CDKN2A variants, the following recommendations have been suggested (PMID: 46568127, 51700174, 94496759):   Melanoma Risk:  Individuals with a CDKN2A variant should consider the following:   Screening for melanoma should begin at age 73 or in adolescence with a baseline total body skin examination including scalp, oral mucosa, genital area, and nail, as family members may develop melanoma in their early teens  [place] should be checked for any changes in morphology, color, symmetry, and size.  Regular clinical skin exams.  Self-examinations at regular intervals (e.g. monthly) either alone or with the assistance of a spouse or relative.  Reinforcement of routine sun protective behaviors.  Screening of all family members is encouraged.  Data suggests those who test negative for a familial variant may still have an increased risk of developing melanoma (due to other shared and environmental risk factors), therefore such relatives should remain under careful dermatologic surveillance and strict sun protection (PMID: 16384665, 99357017).   Melanoma syndromes are relatively rare, and most data regarding follow-up recommendations are based on small studies or expert opinion (PMID: 79390300). Most suggest screening performed at 74-monthintervals is adequate, while some advocate for 359-monthnterval exams (PMID: 2692330076 However, formal prospective outcome studies have not been performed and the exact frequency of follow up (3-91-month-m28-month 1-year intervals) remains unclear (PMID: 268922633354hen planning follow-up visits for high-risk individuals, it is suggested to weigh out the psychological burden of increased  dermatologic surveillance versus its cost effectiveness (PMID: 41660630, 16010932).   Biopsies of skin lesions in the high-risk population should be performed using the same criteria as those used for lesions in the general population. Prophylactic removal of  nevi without clinically worrisome characteristics is not recommended. As many individuals in high-risk families have a large number of nevi that continue to develop, complete removal of them all is not feasible. In addition, individuals with increased susceptibility to melanoma may have melanoma develop on normal unaffected skin in the absence of a dysplastic nevus (PMID: 35573220).   While there remains lack of a clear consensus on dermatologic management and surveillance guidelines for individuals with CDKN2A variants, heightened screening may result in early detection and removal of cutaneous lesions at premalignant (dysplastic naevus or melanoma in situ) stages or while melanomas are thin, which is associated with a more favorable prognosis (PMID: 25427062).   Pancreatic Cancer Risk:  According to [employer]  Guidelines (Genetic/Familial High-Rish Assessment: Breast, Ovarian and Pancreatic, Version 1.2022), all individuals with pathogenic/likely pathogenic germline variants in CDKN2A should consider pancreatic cancer screening beginning at age 31 years (or 20 years younger than the earliest exocrine pancreatic cancer diagnosis in the family, whichever is earlier).    Potential benefits of pancreatic cancer screening include that many screen-detected pancreatic cancers are surgically resectable at diagnosis, and possible improved mortality compared to historical data. For this reason, we recommend that Ms. Bhavsar follow-up with her GI physician (Dr. Fuller Plan at [facility]) for discussion and coordination of pancreatic cancer screening.   The single most important modifier in the risk of pancreatic cancer appears to be cigarette smoking. Therefore, individuals with a CDKN2A variant should avoid cigarette smoking.   An individual's cancer risk and medical management are not determined by genetic test results alone. Overall cancer risk assessment incorporates additional factors,  including personal medical history, family history, and any available genetic information that may result in a personalized plan for cancer prevention and surveillance.   BREAST CANCER RISK MODEL:  Based on the patient's personal and family history, a statistical model Midwife) was used to estimate her risk of developing breast cancer. This model estimates her lifetime risk of developing breast cancer to be approximately 6.8%. The patient's lifetime breast cancer risk is a preliminary estimate based on available information using one of several models endorsed by the [place] (ACS). The ACS recommends consideration of breast MRI screening as an adjunct to mammography for patients at high risk (defined as 20% or greater lifetime risk). A more detailed breast cancer risk assessment can be considered, if clinically indicated.     FAMILY MEMBERS: Even though data regarding CDKN2A are limited, knowing if a pathogenic variant is present is advantageous. It is important that all of Ms. Bonham relatives (both men and women) know of the presence of this gene mutation. Site-specific genetic testing can sort out who in the family is at risk and who is not.  Awareness of this cancer predisposition encourages patients and their providers to inform at-risk family members, to consider implementing proposed screening protocols, and to be vigilant in maintaining close and regular contact with their local genetics clinic in anticipation of new information.   Ms. Rochette children and siblings have a 50% chance to have inherited this mutation. We recommend they have genetic testing for this same mutation, as identifying the presence of this mutation would allow them to also take advantage of risk-reducing measures.    We encouraged Ms. Leeth  to remain in contact with [place] on an annual basis so we can update her personal and family histories, and let her know of advances in cancer genetics that may benefit  the family. Our contact number was provided. Ms. Vogt questions were answered to her satisfaction today, and she knows she is welcome to call anytime with additional questions.    Clint Guy, [place], [facility] Licensed, Certified Dispensing [email or web address] phone: [phone]   The patient was seen for a total of 35 minutes in face-to-face genetic counseling.

## 2020-02-22 ENCOUNTER — Encounter: Payer: Self-pay | Admitting: Genetic Counselor

## 2020-02-24 ENCOUNTER — Telehealth: Payer: Self-pay | Admitting: Gastroenterology

## 2020-02-24 DIAGNOSIS — Z148 Genetic carrier of other disease: Secondary | ICD-10-CM

## 2020-02-24 DIAGNOSIS — Z129 Encounter for screening for malignant neoplasm, site unspecified: Secondary | ICD-10-CM

## 2020-02-24 NOTE — Telephone Encounter (Signed)
Reviewed genetics note. At her office visit with me we discussed strategies for pancreatic cancer screening in high risk individuals to include MRI/MRCP alternating with endoscopic ultrasound every 6-12 months. [facility] and [facility] have developed a protocol and patient registry for pancreatic cancer screening patients so please check with [facility] on how to access this protocol. Start with pancreatic protocol MRI/MRCP.

## 2020-02-24 NOTE — Telephone Encounter (Signed)
Pt is requesting a call back from a nurse, pt was told by Dr Fuller Plan to call the office after she saw the genetic counselor which she did 02/18/2020

## 2020-02-24 NOTE — Telephone Encounter (Signed)
Patient wanted you to know the results of the genetic testing.  See results in her chart.  She states you all discussed a possible MRI

## 2020-02-25 NOTE — Telephone Encounter (Signed)
Left message for patient to call back  

## 2020-02-25 NOTE — Progress Notes (Signed)
We will contact her about starting pancreatic cancer screening. She will continue on with higher risk colorectal cancer screening. Thank you.

## 2020-02-25 NOTE — Telephone Encounter (Signed)
Patient notified of recommendations She had a bunionectomy and skeletal pins placed "within the last 7 years".  She will contact her surgeon and confirm that the pin is MRI compatible and call me back.

## 2020-02-26 NOTE — Telephone Encounter (Signed)
Patient has been scheduled for MRI on 03/09/20 10:00.  She will need to arrive at 9:45 and be NPO after midnight.  Left message for patient to call back

## 2020-02-29 NOTE — Telephone Encounter (Signed)
Perfect. GM 

## 2020-02-29 NOTE — Telephone Encounter (Signed)
Office visit follow up recall entered for 6 months

## 2020-02-29 NOTE — Telephone Encounter (Signed)
I just added her to our high risk Pancreatic Cancer screening list in Epic.  Agree with plan to start with imaging as outlined, with plan for screening EUS in 6 months (provided the MRI/MRCP is unremarkable).  We also have these patients follow-up in the clinic every 6 months to ensure they are doing okay and to ensure they are able to keep up with the screening protocol.

## 2020-02-29 NOTE — Telephone Encounter (Signed)
Patient notified of the recommendations and instructions for the MRCP.  She verbalized understanding.

## 2020-02-29 NOTE — Telephone Encounter (Signed)
How do I have this patient added to your registry for pancreatic cancer screening?  She is scheduled for MRCP next week for first screening

## 2020-03-01 NOTE — Telephone Encounter (Signed)
Great. We will set up an office visit at 6 months.

## 2020-03-09 ENCOUNTER — Other Ambulatory Visit: Payer: Self-pay | Admitting: Gastroenterology

## 2020-03-09 ENCOUNTER — Ambulatory Visit (HOSPITAL_COMMUNITY)
Admission: RE | Admit: 2020-03-09 | Discharge: 2020-03-09 | Disposition: A | Discharge status: Home / Self Care | Payer: Medicare Other | Source: Ambulatory Visit | Attending: Gastroenterology | Admitting: Gastroenterology

## 2020-03-09 ENCOUNTER — Other Ambulatory Visit: Payer: Self-pay

## 2020-03-09 DIAGNOSIS — Z129 Encounter for screening for malignant neoplasm, site unspecified: Secondary | ICD-10-CM | POA: Diagnosis present

## 2020-03-09 DIAGNOSIS — Z148 Genetic carrier of other disease: Secondary | ICD-10-CM | POA: Insufficient documentation

## 2020-03-09 MED ORDER — GADOBUTROL 1 MMOL/ML IV SOLN
6.0000 mL | Freq: Once | INTRAVENOUS | Status: AC | PRN
  Administered 2020-03-09: 10:00:00 6 mL via INTRAVENOUS

## 2020-04-20 ENCOUNTER — Telehealth: Payer: Self-pay | Admitting: Gastroenterology

## 2020-04-20 MED ORDER — OMEPRAZOLE 40 MG PO CPDR
40.0000 mg | DELAYED_RELEASE_CAPSULE | Freq: Every day | ORAL | 3 refills | Status: DC
Start: 2020-04-20 — End: 2020-04-22

## 2020-04-20 NOTE — Telephone Encounter (Signed)
Prescription sent to patient's pharmacy. Patient needs to follow up in June.

## 2020-04-20 NOTE — Telephone Encounter (Signed)
Patient needs to refill Pantoprazole. Please send it to [place] pharmacy.  

## 2020-04-22 MED ORDER — PANTOPRAZOLE SODIUM 40 MG PO TBEC: 40.0000 mg | DELAYED_RELEASE_TABLET | Freq: Every day | ORAL | 5 refills | Status: DC

## 2020-04-22 NOTE — Addendum Note (Signed)
Addended by: Dorisann Frames L on: 04/22/2020 10:06 AM   Modules accepted: Orders

## 2020-04-22 NOTE — Telephone Encounter (Signed)
Patient called to clarify the refill request she states it is for Pantoprazole not Omeprazole please resend

## 2020-04-22 NOTE — Telephone Encounter (Signed)
Patient states she never picked up the omeprazole that was prescribed at the last office visit. She states she knew she was taking a acid medication she had at home that was previously prescribed and it really helped with acid reflux. Patient went home after her appt and realized it is was pantoprazole and has been taking that with improvement in her symptoms. Patient would like pantoprazole sent in instead of omeprazole. Prescription sent to patient's pharmacy.

## 2020-07-13 ENCOUNTER — Other Ambulatory Visit: Payer: Self-pay | Admitting: Obstetrics and Gynecology

## 2020-07-13 DIAGNOSIS — Z1231 Encounter for screening mammogram for malignant neoplasm of breast: Secondary | ICD-10-CM

## 2020-09-05 ENCOUNTER — Ambulatory Visit
Admission: RE | Admit: 2020-09-05 | Discharge: 2020-09-05 | Disposition: A | Discharge status: Home / Self Care | Payer: Medicare Other | Source: Ambulatory Visit | Attending: Obstetrics and Gynecology | Admitting: Obstetrics and Gynecology

## 2020-09-05 ENCOUNTER — Other Ambulatory Visit: Payer: Self-pay

## 2020-09-05 DIAGNOSIS — Z1231 Encounter for screening mammogram for malignant neoplasm of breast: Secondary | ICD-10-CM

## 2020-09-07 ENCOUNTER — Other Ambulatory Visit: Payer: Self-pay | Admitting: Obstetrics and Gynecology

## 2020-09-07 DIAGNOSIS — R928 Other abnormal and inconclusive findings on diagnostic imaging of breast: Secondary | ICD-10-CM

## 2020-09-16 HISTORY — PX: BREAST BIOPSY: SHX20

## 2020-09-20 ENCOUNTER — Other Ambulatory Visit: Payer: Self-pay

## 2020-09-20 ENCOUNTER — Other Ambulatory Visit: Payer: Self-pay | Admitting: Obstetrics and Gynecology

## 2020-09-20 ENCOUNTER — Ambulatory Visit
Admission: RE | Admit: 2020-09-20 | Discharge: 2020-09-20 | Disposition: A | Discharge status: Home / Self Care | Payer: Medicare Other | Source: Ambulatory Visit | Attending: Obstetrics and Gynecology | Admitting: Obstetrics and Gynecology

## 2020-09-20 DIAGNOSIS — R928 Other abnormal and inconclusive findings on diagnostic imaging of breast: Secondary | ICD-10-CM

## 2020-09-22 ENCOUNTER — Ambulatory Visit (INDEPENDENT_AMBULATORY_CARE_PROVIDER_SITE_OTHER): Payer: Medicare Other | Admitting: Gastroenterology

## 2020-09-22 ENCOUNTER — Encounter: Payer: Self-pay | Admitting: Gastroenterology

## 2020-09-22 VITALS — BP 80/60 | HR 56 | Ht 64.0 in | Wt 120.0 lb

## 2020-09-22 DIAGNOSIS — Z1509 Genetic susceptibility to other malignant neoplasm: Secondary | ICD-10-CM | POA: Diagnosis not present

## 2020-09-22 DIAGNOSIS — Z1501 Genetic susceptibility to malignant neoplasm of breast: Secondary | ICD-10-CM | POA: Diagnosis not present

## 2020-09-22 DIAGNOSIS — Z800 Family history of malignant neoplasm of digestive organs: Secondary | ICD-10-CM

## 2020-09-22 NOTE — Patient Instructions (Signed)
Patty, RN will contact you after Dr. Rush Landmark has reviewed your chart to set up further testing.  Thank you for choosing me and [facility].  Pricilla Riffle. Dagoberto Ligas., MD., Marval Regal

## 2020-09-22 NOTE — Progress Notes (Signed)
    History of Present Illness: This is a 70 year old female with monoallelic mutation of RVUY2B gene at higher risk for pancreatic cancer.  She was evaluated by [facility] genetic counseling in December 2021.  I have reviewed their note.  MRI / MRCP performed in December 2021 was unremarkable.  She has started the pancreatic screening protocol with MRI alternating with EUS.  Her sister was screened for the mutation and was negative.  Her son is about to be screened.   Current Medications, Allergies, Past Medical History, Past Surgical History, Family History and Social History were reviewed in [employer] record.   Physical Exam: General: Well developed, well nourished, no acute distress Head: Normocephalic and atraumatic Eyes: Sclerae anicteric, EOMI Ears: Normal auditory acuity Psychological:  Alert and cooperative. Normal mood and affect   Assessment and Recommendations:  Monoallelic mutation of XIDH6Y gene. Higher risk for pancreatic cancer and melanoma.  Patient started pancreatic cancer screening protocol with an MRI/MRCP in Dec. 2021 that was unremarkable. Mgmt previously discussed with Dr. Rush Landmark for pancreatic EUS alternating with pancreatic MRI/MRCP. Schedule EUS with Dr. Rush Landmark.  Patient sees her dermatologist every 6 months for melanoma and other skin cancer screening.  2.   Family history of colon cancer.  A 5-year interval screening colonoscopy is due in May 2024.  3.   GERD.  Follow antireflux measures and continue pantoprazole 40 mg daily.

## 2020-09-23 ENCOUNTER — Telehealth: Payer: Self-pay

## 2020-09-23 NOTE — Telephone Encounter (Signed)
-----   Message from Irving Copas., MD sent at 09/22/2020 11:39 PM EDT ----- [name], Please schedule this patient next available EUS (radial/linear) with DJ or myself.  Okay to use my hospital week if necessary.  Please let Dr. Fuller Plan know when she is scheduled.  Thanks. GM ----- Message ----- From: Ladene Artist, MD Sent: 09/22/2020  10:04 AM EDT To: Timothy Lasso, RN, Irving Copas., MD  [name],  We discussed this patient several months ago regarding a pancreatic cancer screening protocol with MRI alternating with EUS every 6 months. MRI/MRCP in Dec. 2021 was negative. She is now due for EUS.  Please see her office note from today.  Thanks,  MS

## 2020-09-26 ENCOUNTER — Other Ambulatory Visit: Payer: Self-pay

## 2020-09-26 DIAGNOSIS — Z800 Family history of malignant neoplasm of digestive organs: Secondary | ICD-10-CM

## 2020-09-26 NOTE — Telephone Encounter (Signed)
Left message on machine to call back  

## 2020-09-26 NOTE — Telephone Encounter (Signed)
EUS scheduled for 11/09/20 at 9 am at [facility] with GM

## 2020-09-27 ENCOUNTER — Institutional Professional Consult (permissible substitution): Payer: Medicare Other | Admitting: Plastic Surgery

## 2020-09-27 NOTE — Telephone Encounter (Signed)
EUS scheduled, pt instructed and medications reviewed.  Patient instructions mailed to home.  Patient to call with any questions or concerns.  

## 2020-09-29 ENCOUNTER — Ambulatory Visit
Admission: RE | Admit: 2020-09-29 | Discharge: 2020-09-29 | Disposition: A | Discharge status: Home / Self Care | Payer: Medicare Other | Source: Ambulatory Visit | Attending: Obstetrics and Gynecology | Admitting: Obstetrics and Gynecology

## 2020-09-29 ENCOUNTER — Other Ambulatory Visit: Payer: Self-pay | Admitting: Obstetrics and Gynecology

## 2020-09-29 ENCOUNTER — Other Ambulatory Visit: Payer: Self-pay

## 2020-09-29 DIAGNOSIS — R928 Other abnormal and inconclusive findings on diagnostic imaging of breast: Secondary | ICD-10-CM

## 2020-10-17 ENCOUNTER — Ambulatory Visit: Payer: Self-pay | Admitting: Surgery

## 2020-10-17 DIAGNOSIS — N6092 Unspecified benign mammary dysplasia of left breast: Secondary | ICD-10-CM

## 2020-10-17 HISTORY — PX: BREAST EXCISIONAL BIOPSY: SUR124

## 2020-10-20 ENCOUNTER — Other Ambulatory Visit: Payer: Self-pay | Admitting: Surgery

## 2020-10-20 DIAGNOSIS — N6092 Unspecified benign mammary dysplasia of left breast: Secondary | ICD-10-CM

## 2020-10-25 ENCOUNTER — Telehealth: Payer: Self-pay | Admitting: Gastroenterology

## 2020-10-25 NOTE — Telephone Encounter (Signed)
Inbound call from patient. States having surgery on her breast 8/18. Wants to know if should reschedule endo 8/24

## 2020-10-25 NOTE — Telephone Encounter (Signed)
The pt has an EUS scheduled for 8/24 with GM.  Also a left breast lumpectomy with radioactive seed localization on 8/18.  She has a call to her surgeon to see if she should proceed with the EUS so soon after the breast surgery however, she would also like to Dr Donneta Romberg recommendation as well.

## 2020-10-25 NOTE — Telephone Encounter (Signed)
TC, Thanks for the update.  Patty, please reach out to the patient and let her know that we will get her scheduled with DJ or myself for EUS in the 6 weeks after her lumpectomy has been completed. Thanks. GM  FYI MS

## 2020-10-25 NOTE — Telephone Encounter (Signed)
[name], This certainly being on the left lateral position allows the endoscope and echoendoscope to have better positioning to evaluate the pancreas completely which is important in the setting of patient's poor in the high risk protocol/screening cohort. With this being said, seeing as that she will have just undergone a left lumpectomy, it may be ideal to hold on EUS for at least 6 to 8 weeks so that things can heal so she can be on her left lateral side a little bit easier. I am placing Dr. Brantley Stage, her surgeon, on this as well so hopefully we can decide altogether what will be the best of situation for her.  TC, would you be okay in approximately 6 to 8 weeks for [place] to have her on a good left lateral position for endoscopy after lumpectomy?  Do you have any concerns if we were to do this within a week of her procedure, I think it may be uncomfortable for her.  Thanks to all. GM

## 2020-10-26 ENCOUNTER — Other Ambulatory Visit: Payer: Self-pay

## 2020-10-26 ENCOUNTER — Encounter (HOSPITAL_BASED_OUTPATIENT_CLINIC_OR_DEPARTMENT_OTHER)
Admission: RE | Admit: 2020-10-26 | Discharge: 2020-10-26 | Disposition: A | Discharge status: Home / Self Care | Payer: Medicare Other | Source: Ambulatory Visit | Attending: Surgery | Admitting: Surgery

## 2020-10-26 ENCOUNTER — Encounter (HOSPITAL_BASED_OUTPATIENT_CLINIC_OR_DEPARTMENT_OTHER): Payer: Self-pay | Admitting: Surgery

## 2020-10-26 DIAGNOSIS — Z01812 Encounter for preprocedural laboratory examination: Secondary | ICD-10-CM | POA: Insufficient documentation

## 2020-10-26 DIAGNOSIS — Z01810 Encounter for preprocedural cardiovascular examination: Secondary | ICD-10-CM | POA: Insufficient documentation

## 2020-10-26 NOTE — Telephone Encounter (Signed)
I spoke with the pt and rescheduled her to 12/12/20 at 1130 am.  The pt has been instructed for new date and time. She will call if she has any questions or concerns.

## 2020-10-26 NOTE — Progress Notes (Signed)
CHG also given with written instruction. Pt verbalized understanding of both.     Enhanced Recovery after Surgery for Orthopedics Enhanced Recovery after Surgery is a protocol used to improve the stress on your body and your recovery after surgery.  Patient Instructions  The night before surgery:  No food after midnight. ONLY clear liquids after midnight  The day of surgery (if you do NOT have diabetes):  Drink ONE (1) Pre-Surgery Clear Ensure as directed.   This drink was given to you during your hospital  pre-op appointment visit. The pre-op nurse will instruct you on the time to drink the  Pre-Surgery Ensure depending on your surgery time. Finish the drink at the designated time by the pre-op nurse.  Nothing else to drink after completing the  Pre-Surgery Clear Ensure.  The day of surgery (if you have diabetes): Drink ONE (1) Gatorade 2 (G2) as directed. This drink was given to you during your hospital  pre-op appointment visit.  The pre-op nurse will instruct you on the time to drink the   Gatorade 2 (G2) depending on your surgery time. Color of the Gatorade may vary. Red is not allowed. Nothing else to drink after completing the  Gatorade 2 (G2).         If you have questions, please contact your surgeon's office.

## 2020-10-26 NOTE — Telephone Encounter (Signed)
Left message on machine to call back  

## 2020-11-01 ENCOUNTER — Ambulatory Visit
Admission: RE | Admit: 2020-11-01 | Discharge: 2020-11-01 | Disposition: A | Discharge status: Home / Self Care | Payer: Medicare Other | Source: Ambulatory Visit | Attending: Surgery | Admitting: Surgery

## 2020-11-01 ENCOUNTER — Other Ambulatory Visit: Payer: Self-pay

## 2020-11-01 DIAGNOSIS — N6092 Unspecified benign mammary dysplasia of left breast: Secondary | ICD-10-CM

## 2020-11-03 ENCOUNTER — Other Ambulatory Visit: Payer: Self-pay

## 2020-11-03 ENCOUNTER — Ambulatory Visit (HOSPITAL_BASED_OUTPATIENT_CLINIC_OR_DEPARTMENT_OTHER): Payer: Medicare Other | Admitting: Certified Registered"

## 2020-11-03 ENCOUNTER — Encounter (HOSPITAL_BASED_OUTPATIENT_CLINIC_OR_DEPARTMENT_OTHER): Payer: Self-pay | Admitting: Surgery

## 2020-11-03 ENCOUNTER — Ambulatory Visit (HOSPITAL_BASED_OUTPATIENT_CLINIC_OR_DEPARTMENT_OTHER)
Admission: RE | Admit: 2020-11-03 | Discharge: 2020-11-03 | Disposition: A | Discharge status: Home / Self Care | Payer: Medicare Other | Attending: Surgery | Admitting: Surgery

## 2020-11-03 ENCOUNTER — Encounter (HOSPITAL_BASED_OUTPATIENT_CLINIC_OR_DEPARTMENT_OTHER)
Admission: RE | Disposition: A | Discharge status: Home / Self Care | Payer: Self-pay | Source: Home / Self Care | Attending: Surgery

## 2020-11-03 ENCOUNTER — Ambulatory Visit
Admission: RE | Admit: 2020-11-03 | Discharge: 2020-11-03 | Disposition: A | Discharge status: Home / Self Care | Payer: Medicare Other | Source: Ambulatory Visit | Attending: Surgery | Admitting: Surgery

## 2020-11-03 DIAGNOSIS — N6092 Unspecified benign mammary dysplasia of left breast: Secondary | ICD-10-CM | POA: Diagnosis present

## 2020-11-03 DIAGNOSIS — Z800 Family history of malignant neoplasm of digestive organs: Secondary | ICD-10-CM | POA: Insufficient documentation

## 2020-11-03 DIAGNOSIS — Z803 Family history of malignant neoplasm of breast: Secondary | ICD-10-CM | POA: Insufficient documentation

## 2020-11-03 DIAGNOSIS — Z8249 Family history of ischemic heart disease and other diseases of the circulatory system: Secondary | ICD-10-CM | POA: Insufficient documentation

## 2020-11-03 DIAGNOSIS — Z79899 Other long term (current) drug therapy: Secondary | ICD-10-CM | POA: Insufficient documentation

## 2020-11-03 DIAGNOSIS — Z87891 Personal history of nicotine dependence: Secondary | ICD-10-CM | POA: Diagnosis not present

## 2020-11-03 HISTORY — PX: BREAST LUMPECTOMY WITH RADIOACTIVE SEED LOCALIZATION: SHX6424

## 2020-11-03 SURGERY — BREAST LUMPECTOMY WITH RADIOACTIVE SEED LOCALIZATION
Anesthesia: General | Site: Breast | Laterality: Left

## 2020-11-03 MED ORDER — CHLORHEXIDINE GLUCONATE CLOTH 2 % EX PADS: 6.0000 | MEDICATED_PAD | Freq: Once | CUTANEOUS | Status: DC

## 2020-11-03 MED ORDER — FENTANYL CITRATE (PF) 100 MCG/2ML IJ SOLN
INTRAMUSCULAR | Status: DC | PRN
  Administered 2020-11-03 (×2): 25 ug via INTRAVENOUS

## 2020-11-03 MED ORDER — SODIUM CHLORIDE 0.9 % IV SOLN
INTRAVENOUS | Status: DC | PRN
  Administered 2020-11-03: 250 mL

## 2020-11-03 MED ORDER — MIDAZOLAM HCL 2 MG/2ML IJ SOLN
INTRAMUSCULAR | Status: AC
  Filled 2020-11-03: qty 2

## 2020-11-03 MED ORDER — OXYCODONE HCL 5 MG PO TABS
5.0000 mg | ORAL_TABLET | Freq: Once | ORAL | Status: AC | PRN
  Administered 2020-11-03: 5 mg via ORAL

## 2020-11-03 MED ORDER — EPHEDRINE SULFATE 50 MG/ML IJ SOLN
INTRAMUSCULAR | Status: DC | PRN
  Administered 2020-11-03: 5 mg via INTRAVENOUS
  Administered 2020-11-03: 10 mg via INTRAVENOUS
  Administered 2020-11-03: 5 mg via INTRAVENOUS
  Administered 2020-11-03: 10 mg via INTRAVENOUS

## 2020-11-03 MED ORDER — OXYCODONE HCL 5 MG PO TABS
ORAL_TABLET | ORAL | Status: AC
  Filled 2020-11-03: qty 1

## 2020-11-03 MED ORDER — LIDOCAINE 2% (20 MG/ML) 5 ML SYRINGE
INTRAMUSCULAR | Status: DC | PRN
Start: 2020-11-03 — End: 2020-11-03
  Administered 2020-11-03: 60 mg via INTRAVENOUS

## 2020-11-03 MED ORDER — BUPIVACAINE-EPINEPHRINE (PF) 0.25% -1:200000 IJ SOLN
INTRAMUSCULAR | Status: DC | PRN
  Administered 2020-11-03: 9 mL

## 2020-11-03 MED ORDER — EPHEDRINE 5 MG/ML INJ
INTRAVENOUS | Status: AC
  Filled 2020-11-03: qty 10

## 2020-11-03 MED ORDER — AMISULPRIDE (ANTIEMETIC) 5 MG/2ML IV SOLN: 10.0000 mg | Freq: Once | INTRAVENOUS | Status: DC | PRN

## 2020-11-03 MED ORDER — GLYCOPYRROLATE 0.2 MG/ML IJ SOLN
INTRAMUSCULAR | Status: DC | PRN
  Administered 2020-11-03: .2 mg via INTRAVENOUS

## 2020-11-03 MED ORDER — OXYCODONE HCL 5 MG/5ML PO SOLN: 5.0000 mg | Freq: Once | ORAL | Status: AC | PRN

## 2020-11-03 MED ORDER — VANCOMYCIN HCL 500 MG IV SOLR
INTRAVENOUS | Status: AC
  Filled 2020-11-03: qty 10

## 2020-11-03 MED ORDER — DEXAMETHASONE SODIUM PHOSPHATE 10 MG/ML IJ SOLN
INTRAMUSCULAR | Status: AC
  Filled 2020-11-03: qty 1

## 2020-11-03 MED ORDER — FENTANYL CITRATE (PF) 100 MCG/2ML IJ SOLN
INTRAMUSCULAR | Status: AC
  Filled 2020-11-03: qty 2

## 2020-11-03 MED ORDER — ACETAMINOPHEN 500 MG PO TABS
1000.0000 mg | ORAL_TABLET | ORAL | Status: AC
  Administered 2020-11-03: 1000 mg via ORAL

## 2020-11-03 MED ORDER — HYDROMORPHONE HCL 1 MG/ML IJ SOLN: 0.2500 mg | INTRAMUSCULAR | Status: DC | PRN

## 2020-11-03 MED ORDER — PROPOFOL 10 MG/ML IV BOLUS
INTRAVENOUS | Status: AC
  Filled 2020-11-03: qty 20

## 2020-11-03 MED ORDER — ONDANSETRON HCL 4 MG/2ML IJ SOLN
INTRAMUSCULAR | Status: DC | PRN
  Administered 2020-11-03: 4 mg via INTRAVENOUS

## 2020-11-03 MED ORDER — OXYCODONE HCL 5 MG PO TABS: 5.0000 mg | ORAL_TABLET | Freq: Four times a day (QID) | ORAL | 0 refills | Status: DC | PRN

## 2020-11-03 MED ORDER — CEFAZOLIN SODIUM-DEXTROSE 2-4 GM/100ML-% IV SOLN
2.0000 g | INTRAVENOUS | Status: AC
  Administered 2020-11-03: 2 g via INTRAVENOUS

## 2020-11-03 MED ORDER — LACTATED RINGERS IV SOLN: INTRAVENOUS | Status: DC

## 2020-11-03 MED ORDER — MIDAZOLAM HCL 5 MG/5ML IJ SOLN
INTRAMUSCULAR | Status: DC | PRN
Start: 2020-11-03 — End: 2020-11-03
  Administered 2020-11-03: 2 mg via INTRAVENOUS

## 2020-11-03 MED ORDER — DEXAMETHASONE SODIUM PHOSPHATE 4 MG/ML IJ SOLN
INTRAMUSCULAR | Status: DC | PRN
  Administered 2020-11-03: 5 mg via INTRAVENOUS

## 2020-11-03 MED ORDER — ACETAMINOPHEN 500 MG PO TABS
ORAL_TABLET | ORAL | Status: AC
  Filled 2020-11-03: qty 2

## 2020-11-03 MED ORDER — PROPOFOL 10 MG/ML IV BOLUS
INTRAVENOUS | Status: DC | PRN
  Administered 2020-11-03: 200 mg via INTRAVENOUS

## 2020-11-03 MED ORDER — PROMETHAZINE HCL 25 MG/ML IJ SOLN: 6.2500 mg | INTRAMUSCULAR | Status: DC | PRN

## 2020-11-03 MED ORDER — SODIUM CHLORIDE 0.9 % IV SOLN
INTRAVENOUS | Status: AC
  Filled 2020-11-03: qty 10

## 2020-11-03 MED ORDER — MEPERIDINE HCL 25 MG/ML IJ SOLN: 6.2500 mg | INTRAMUSCULAR | Status: DC | PRN

## 2020-11-03 MED ORDER — CEFAZOLIN SODIUM-DEXTROSE 2-4 GM/100ML-% IV SOLN
INTRAVENOUS | Status: AC
  Filled 2020-11-03: qty 100

## 2020-11-03 SURGICAL SUPPLY — 51 items
ADH SKN CLS APL DERMABOND .7 (GAUZE/BANDAGES/DRESSINGS) ×1
APL PRP STRL LF DISP 70% ISPRP (MISCELLANEOUS) ×1
APPLIER CLIP 9.375 MED OPEN (MISCELLANEOUS)
APR CLP MED 9.3 20 MLT OPN (MISCELLANEOUS)
BINDER BREAST LRG (GAUZE/BANDAGES/DRESSINGS) ×1 IMPLANT
BINDER BREAST MEDIUM (GAUZE/BANDAGES/DRESSINGS) IMPLANT
BINDER BREAST XLRG (GAUZE/BANDAGES/DRESSINGS) IMPLANT
BINDER BREAST XXLRG (GAUZE/BANDAGES/DRESSINGS) IMPLANT
BLADE SURG 15 STRL LF DISP TIS (BLADE) ×1 IMPLANT
BLADE SURG 15 STRL SS (BLADE) ×2
CANISTER SUC SOCK COL 7IN (MISCELLANEOUS) IMPLANT
CANISTER SUCT 1200ML W/VALVE (MISCELLANEOUS) ×1 IMPLANT
CHLORAPREP W/TINT 26 (MISCELLANEOUS) ×2 IMPLANT
CLIP APPLIE 9.375 MED OPEN (MISCELLANEOUS) IMPLANT
COVER BACK TABLE 60X90IN (DRAPES) ×2 IMPLANT
COVER MAYO STAND STRL (DRAPES) ×2 IMPLANT
COVER PROBE W GEL 5X96 (DRAPES) ×2 IMPLANT
DECANTER SPIKE VIAL GLASS SM (MISCELLANEOUS) IMPLANT
DERMABOND ADVANCED (GAUZE/BANDAGES/DRESSINGS) ×1
DERMABOND ADVANCED .7 DNX12 (GAUZE/BANDAGES/DRESSINGS) ×1 IMPLANT
DRAPE LAPAROSCOPIC ABDOMINAL (DRAPES) IMPLANT
DRAPE LAPAROTOMY 100X72 PEDS (DRAPES) ×2 IMPLANT
DRAPE UTILITY XL STRL (DRAPES) ×2 IMPLANT
ELECT COATED BLADE 2.86 ST (ELECTRODE) ×2 IMPLANT
ELECT REM PT RETURN 9FT ADLT (ELECTROSURGICAL) ×2
ELECTRODE REM PT RTRN 9FT ADLT (ELECTROSURGICAL) ×1 IMPLANT
GLOVE SRG 8 PF TXTR STRL LF DI (GLOVE) ×1 IMPLANT
GLOVE SURG LTX SZ8 (GLOVE) ×2 IMPLANT
GLOVE SURG UNDER POLY LF SZ8 (GLOVE) ×2
GOWN STRL REUS W/ TWL LRG LVL3 (GOWN DISPOSABLE) ×2 IMPLANT
GOWN STRL REUS W/ TWL XL LVL3 (GOWN DISPOSABLE) ×1 IMPLANT
GOWN STRL REUS W/TWL LRG LVL3 (GOWN DISPOSABLE) ×4
GOWN STRL REUS W/TWL XL LVL3 (GOWN DISPOSABLE) ×2
HEMOSTAT ARISTA ABSORB 3G PWDR (HEMOSTASIS) IMPLANT
HEMOSTAT SNOW SURGICEL 2X4 (HEMOSTASIS) IMPLANT
KIT MARKER MARGIN INK (KITS) ×2 IMPLANT
NDL HYPO 25X1 1.5 SAFETY (NEEDLE) ×1 IMPLANT
NEEDLE HYPO 25X1 1.5 SAFETY (NEEDLE) ×2 IMPLANT
NS IRRIG 1000ML POUR BTL (IV SOLUTION) ×1 IMPLANT
PACK BASIN DAY SURGERY FS (CUSTOM PROCEDURE TRAY) ×2 IMPLANT
PENCIL SMOKE EVACUATOR (MISCELLANEOUS) ×2 IMPLANT
SLEEVE SCD COMPRESS KNEE MED (STOCKING) ×2 IMPLANT
SPONGE T-LAP 4X18 ~~LOC~~+RFID (SPONGE) ×2 IMPLANT
SUT MNCRL AB 4-0 PS2 18 (SUTURE) ×2 IMPLANT
SUT SILK 2 0 SH (SUTURE) IMPLANT
SUT VICRYL 3-0 CR8 SH (SUTURE) ×2 IMPLANT
SYR CONTROL 10ML LL (SYRINGE) ×2 IMPLANT
TOWEL GREEN STERILE FF (TOWEL DISPOSABLE) ×2 IMPLANT
TRAY FAXITRON CT DISP (TRAY / TRAY PROCEDURE) ×2 IMPLANT
TUBE CONNECTING 20X1/4 (TUBING) ×1 IMPLANT
YANKAUER SUCT BULB TIP NO VENT (SUCTIONS) ×1 IMPLANT

## 2020-11-03 NOTE — Op Note (Signed)
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  She did have an area of abnormality core biopsy proven to be atypical ductal hyperplasia.  We discussed potential cancer risk with these findings on core biopsy and the need for lumpectomy to exclude invasive disease process.  Upgrade risk over 20% was discussed as well as the rationale for performing lumpectomy.  Discussed potential issues with her implants and damage in and would need to replace her implants this type of surgery.  We discussed observation as well.  After reviewing all of her options she opted for lumpectomy.The procedure has been discussed with the patient. Alternatives to surgery have been discussed with the patient.  Risks of surgery include bleeding,  Infection,  Seroma formation, death,  and the need for further surgery.   The patient understands and wishes to proceed.     Description of procedure: The patient was met in the holding area and questions were answered.  Neoprobe used to identify the seed left breast upper outer quadrant.  She was then taken back to the operative room.  She was placed supine.  Induction of general anesthesia, left breast was prepped and draped in sterile fashion and timeout performed.  Neoprobe used and seed identified left breast upper outer quadrant at the border of the nipple areolar complex.  Curvilinear incision was made along the previous scar from a reduction along the NAC.  Dissection was carried down the tissue was identified  with with the help of the neoprobe.  It was excised.  The Faxitron image revealed both seed and clip to be present.  Hemostasis achieved with cautery.  Wound closed with a deep layer 3-0 Vicryl and 4 Monocryl.  Of note care was taken to preserve her implant which appeared to be subpectoral.  Dermabond applied.  Breast binder placed.  All counts found to be correct.  Patient was awoke extubated taken to recovery in satisfactory condition.

## 2020-11-03 NOTE — Discharge Instructions (Addendum)
[place] Office Phone Number [phone]  BREAST BIOPSY/ PARTIAL MASTECTOMY: POST OP INSTRUCTIONS  Always review your discharge instruction sheet given to you by the facility where your surgery was performed.  IF YOU HAVE DISABILITY OR FAMILY LEAVE FORMS, YOU MUST BRING THEM TO THE OFFICE FOR PROCESSING.  DO NOT GIVE THEM TO YOUR DOCTOR.  A prescription for pain medication may be given to you upon discharge.  Take your pain medication as prescribed, if needed.  If narcotic pain medicine is not needed, then you may take acetaminophen (Tylenol) or ibuprofen (Advil) as needed. Take your usually prescribed medications unless otherwise directed If you need a refill on your pain medication, please contact your pharmacy.  They will contact our office to request authorization.  Prescriptions will not be filled after 5pm or on week-ends. You should eat very light the first 24 hours after surgery, such as soup, crackers, pudding, etc.  Resume your normal diet the day after surgery. Most patients will experience some swelling and bruising in the breast.  Ice packs and a good support bra will help.  Swelling and bruising can take several days to resolve.  It is common to experience some constipation if taking pain medication after surgery.  Increasing fluid intake and taking a stool softener will usually help or prevent this problem from occurring.  A mild laxative (Milk of Magnesia or Miralax) should be taken according to package directions if there are no bowel movements after 48 hours. Unless discharge instructions indicate otherwise, you may remove your bandages 24-48 hours after surgery, and you may shower at that time.  You may have steri-strips (small skin tapes) in place directly over the incision.  These strips should be left on the skin for 7-10 days.  If your surgeon used skin glue on the incision, you may shower in 24 hours.  The glue will flake off over the next 2-3 weeks.  Any  sutures or staples will be removed at the office during your follow-up visit. ACTIVITIES:  You may resume regular daily activities (gradually increasing) beginning the next day.  Wearing a good support bra or sports bra minimizes pain and swelling.  You may have sexual intercourse when it is comfortable. You may drive when you no longer are taking prescription pain medication, you can comfortably wear a seatbelt, and you can safely maneuver your car and apply brakes. RETURN TO WORK:  ______________________________________________________________________________________ Dennis Bast should see your doctor in the office for a follow-up appointment approximately two weeks after your surgery.  Your doctor's nurse will typically make your follow-up appointment when she calls you with your pathology report.  Expect your pathology report 2-3 business days after your surgery.  You may call to check if you do not hear from [place] after three days. OTHER INSTRUCTIONS: _______________________________________________________________________________________________ _____________________________________________________________________________________________________________________________________ _____________________________________________________________________________________________________________________________________ _____________________________________________________________________________________________________________________________________  WHEN TO CALL YOUR DOCTOR: Fever over 101.0 Nausea and/or vomiting. Extreme swelling or bruising. Continued bleeding from incision. Increased pain, redness, or drainage from the incision.  The clinic staff is available to answer your questions during regular business hours.  Please don't hesitate to call and ask to speak to one of the nurses for clinical concerns.  If you have a medical emergency, go to the nearest emergency room or call 911.  A surgeon from [facility] is always on call at the hospital.  For further questions, please visit [email or web address]    No Tylenol until 2:09 pm   Post Anesthesia Home Care Instructions  Activity: Get  plenty of rest for the remainder of the day. A responsible individual must stay with you for 24 hours following the procedure.  For the next 24 hours, DO NOT: -Drive a car -Paediatric nurse -Drink alcoholic beverages -Take any medication unless instructed by your physician -Make any legal decisions or sign important papers.  Meals: Start with liquid foods such as gelatin or soup. Progress to regular foods as tolerated. Avoid greasy, spicy, heavy foods. If nausea and/or vomiting occur, drink only clear liquids until the nausea and/or vomiting subsides. Call your physician if vomiting continues.  Special Instructions/Symptoms: Your throat may feel dry or sore from the anesthesia or the breathing tube placed in your throat during surgery. If this causes discomfort, gargle with warm salt water. The discomfort should disappear within 24 hours.  If you had a scopolamine patch placed behind your ear for the management of post- operative nausea and/or vomiting:  1. The medication in the patch is effective for 72 hours, after which it should be removed.  Wrap patch in a tissue and discard in the trash. Wash hands thoroughly with soap and water. 2. You may remove the patch earlier than 72 hours if you experience unpleasant side effects which may include dry mouth, dizziness or visual disturbances. 3. Avoid touching the patch. Wash your hands with soap and water after contact with the patch.

## 2020-11-03 NOTE — Transfer of Care (Signed)
Immediate Anesthesia Transfer of Care Note  Patient: [name]  Procedure(s) Performed: LEFT BREAST LUMPECTOMY WITH RADIOACTIVE SEED LOCALIZATION (Left: Breast)  Patient Location: PACU  Anesthesia Type:General  Level of Consciousness: drowsy  Airway & Oxygen Therapy: Patient Spontanous Breathing and Patient connected to face mask oxygen  Post-op Assessment: Report given to RN and Post -op Vital signs reviewed and stable  Post vital signs: Reviewed and stable  Last Vitals:  Vitals Value Taken Time  BP    Temp    Pulse 88 11/03/20 1109  Resp 12 11/03/20 1109  SpO2 100 % 11/03/20 1109  Vitals shown include unvalidated device data.  Last Pain:  Vitals:   11/03/20 0754  TempSrc: Oral  PainSc: 0-No pain      Patients Stated Pain Goal: 3 ([id number])  Complications: No notable events documented.

## 2020-11-03 NOTE — H&P (Signed)
DOB: [birth year] DATE OF ENCOUNTER: 10/17/2020  Subjective   Chief Complaint: Lactation Services   History of Present Illness: [name] is a 70 y.o. female who is seen today as an office consultation at the request of Dr. Rogue Bussing for evaluation of mammographic abnormality. Patient had routine screening and subsequent diagnostic mammography which revealed a density cystic in nature measuring 4 mm. Core biopsy showed atypical lobular hyperplasia. Family history of an aunt and cousin maternal with breast cancer. She does have genetic risk factors for pancreatic and colon cancer and is screened for that already. No history of breast pain nipple discharge or breast mass for the most part. Does have some soreness in the left breast prior to the biopsy. Does have retropectoral implants from 2001 that are saline-based.  Review of Systems: A complete review of systems was obtained from the patient. I have reviewed this information and discussed as appropriate with the patient. See HPI as well for other ROS.  Review of Systems  Constitutional: Negative.  HENT: Negative.  Eyes: Negative.  Respiratory: Negative.  Cardiovascular: Negative.  Gastrointestinal: Negative.  Genitourinary: Negative.  Skin: Negative.  Neurological: Negative.  Psychiatric/Behavioral: Negative.    Medical History: Past Medical History:  Diagnosis Date   Anxiety   Arthritis   History of cancer   There is no problem list on file for this patient.  Past Surgical History:  Procedure Laterality Date   breast implants surgery N/A   c-section surgery N/A   HYSTERECTOMY   WRIST ARTHROPLASTY N/A    No Known Allergies  Current Outpatient Medications on File Prior to Visit  Medication Sig Dispense Refill   cloNIDine HCL (CATAPRES) 0.1 MG tablet TAKE 1 OR 2 TABLETS BY MOUTH AT BEDTIME   estradiol (VIVELLE-DOT) patch 0.025 mg/24 hr Place 1 patch onto the skin twice a week   pantoprazole (PROTONIX) 40 MG DR tablet  Take 40 mg by mouth once daily   RESTASIS 0.05 % ophthalmic emulsion INSERT 1 DROP INTO BOTH EYES TWICE DAILY AS DIRECTED.   zaleplon (SONATA) 10 MG capsule take 1-2 capsules by mouth at bedtime   zolpidem (AMBIEN CR) 12.5 MG CR tablet Take 12.5 mg by mouth nightly   No current facility-administered medications on file prior to visit.   Family History  Problem Relation Age of Onset   High blood pressure (Hypertension) Father   Colon cancer Father    Social History   Tobacco Use  Smoking Status Former Smoker  Smokeless Tobacco Never Used    Social History   Socioeconomic History   Marital status: Divorced  Tobacco Use   Smoking status: Former Smoker   Smokeless tobacco: Never Used  Substance and Sexual Activity   Alcohol use: Not Currently   Drug use: Never   Objective:   Vitals:  10/17/20 0926  BP: 112/80  Pulse: 80  Temp: 36.7 C (98 F)  SpO2: 98%  Weight: 55.3 kg (122 lb)  Height: 162.6 cm ('5\' 4"'$ )   Body mass index is 20.94 kg/m.  Physical Exam Constitutional:  Appearance: Normal appearance.  HENT:  Head: Normocephalic and atraumatic.  Nose: Nose normal.  Eyes:  Conjunctiva/sclera: Conjunctivae normal.  Pupils: Pupils are equal, round, and reactive to light.  Cardiovascular:  Rate and Rhythm: Normal rate and regular rhythm.  Pulmonary:  Effort: Pulmonary effort is normal.  Breath sounds: Normal breath sounds.  Chest:  Breasts: Breasts are symmetrical.  Right: Normal. No swelling or mass.  Left: Normal. No swelling or mass.  Comments: Implants noted  Minimal capsule  Abdominal:  General: Abdomen is flat.  Palpations: Abdomen is soft.  Musculoskeletal:  General: Normal range of motion.  Cervical back: Normal range of motion and neck supple.  Skin: General: Skin is warm and dry.  Neurological:  General: No focal deficit present.  Mental Status: She is alert and oriented to person, place, and time.  Psychiatric:  Mood and Affect: Mood  normal.  Behavior: Behavior normal.     Labs, Imaging and Diagnostic Testing: Recall from screening mammography, possible mass involving the upper subareolar LEFT breast.   EXAM: ULTRASOUND OF THE LEFT BREAST   COMPARISON: Previous exam(s).   FINDINGS: Targeted ultrasound is performed, showing a nearly anechoic mass with scattered internal echoes at the 12 o'clock subareolar location measuring approximately 4 x 4 x 4 mm, demonstrating posterior acoustic enhancement and no internal power Doppler flow. The majority of the margins of the mass are circumscribed, though there is minimal irregularity of its anterolateral margin.   IMPRESSION: Likely benign mildly complicated 4 mm cyst in the 12 o'clock subareolar location of the LEFT breast which accounts for the screening mammographic finding.   RECOMMENDATION: Given the fact that the anterolateral margin of likely benign cyst is minimally irregular, cyst aspiration is recommended in order to confirm benignity.   The cyst aspiration procedure was discussed with the patient and her questions were answered. She wishes to proceed and the cyst aspiration has been scheduled by the [place] staff.   I have discussed the findings and recommendations with the patient. If applicable, a reminder letter will be sent to the patient regarding the next appointment.   BI-RADS CATEGORY 3: Probably benign.     Electronically Signed By: Evangeline Dakin M.D. On: 09/20/2020 15:10   Diagnosis Breast, left, needle core biopsy, retroareolar 12 o'clock, ribbon clip - ATYPICAL LOBULAR HYPERPLASIA (LOBULAR NEOPLASIA). - SEE MICROSCOPIC DESCRIPTION.  Assessment and Plan:  Diagnoses and all orders for this visit:  Atypical lobular hyperplasia of left breast   Discuss left breast seed lumpectomy due to atypical nature. Discussed potential high risk state with lifetime risk more than likely over 15 to 20% based on these findings.  Recommend ways to reduce risk to include more intensive screening, MRI, medical management, and surgical management. She has agreed to proceed with left breast lumpectomy seed localized due to atypical lobular hyperplasia. We discussed nonoperative management and observation as well. The procedure has been discussed with the patient. Alternatives to surgery have been discussed with the patient. Risks of surgery include bleeding, Infection, Seroma formation, death, and the need for further surgery. The patient understands and wishes to proceed

## 2020-11-03 NOTE — Interval H&P Note (Signed)
History and Physical Interval Note:  11/03/2020 9:36 AM  [name]  has presented today for surgery, with the diagnosis of LEFT BREAST ATYPICAL LOBULAR HYPERPLASIA.  The various methods of treatment have been discussed with the patient and family. After consideration of risks, benefits and other options for treatment, the patient has consented to  Procedure(s): LEFT BREAST LUMPECTOMY WITH RADIOACTIVE SEED LOCALIZATION (Left) as a surgical intervention.  The patient's history has been reviewed, patient examined, no change in status, stable for surgery.  I have reviewed the patient's chart and labs.  Questions were answered to the patient's satisfaction.     [place]

## 2020-11-03 NOTE — Anesthesia Preprocedure Evaluation (Signed)
Anesthesia Evaluation  Patient identified by MRN, date of birth, ID band Patient awake    Reviewed: Allergy & Precautions, NPO status , Patient's Chart, lab work & pertinent test results  Airway Mallampati: II  TM Distance: >3 FB Neck ROM: Full    Dental no notable dental hx.    Pulmonary neg pulmonary ROS, former smoker,    Pulmonary exam normal breath sounds clear to auscultation       Cardiovascular negative cardio ROS Normal cardiovascular exam Rhythm:Regular Rate:Normal     Neuro/Psych Anxiety Depression negative neurological ROS  negative psych ROS   GI/Hepatic Neg liver ROS, GERD  ,  Endo/Other  negative endocrine ROS  Renal/GU negative Renal ROS  negative genitourinary   Musculoskeletal  (+) Arthritis , Osteoarthritis,    Abdominal   Peds negative pediatric ROS (+)  Hematology negative hematology ROS (+)   Anesthesia Other Findings   Reproductive/Obstetrics negative OB ROS                             Anesthesia Physical Anesthesia Plan  ASA: 2  Anesthesia Plan: General   Post-op Pain Management:    Induction: Intravenous  PONV Risk Score and Plan: 3 and Ondansetron, Dexamethasone, Midazolam and Treatment may vary due to age or medical condition  Airway Management Planned: LMA  Additional Equipment:   Intra-op Plan:   Post-operative Plan: Extubation in OR  Informed Consent: I have reviewed the patients History and Physical, chart, labs and discussed the procedure including the risks, benefits and alternatives for the proposed anesthesia with the patient or authorized representative who has indicated his/her understanding and acceptance.     Dental advisory given  Plan Discussed with: CRNA  Anesthesia Plan Comments:         Anesthesia Quick Evaluation

## 2020-11-03 NOTE — Anesthesia Postprocedure Evaluation (Signed)
Anesthesia Post Note  Patient: [name]  Procedure(s) Performed: LEFT BREAST LUMPECTOMY WITH RADIOACTIVE SEED LOCALIZATION (Left: Breast)     Patient location during evaluation: PACU Anesthesia Type: General Level of consciousness: awake and alert Pain management: pain level controlled Vital Signs Assessment: post-procedure vital signs reviewed and stable Respiratory status: spontaneous breathing, nonlabored ventilation and respiratory function stable Cardiovascular status: blood pressure returned to baseline and stable Postop Assessment: no apparent nausea or vomiting Anesthetic complications: no   No notable events documented.  Last Vitals:  Vitals:   11/03/20 1130 11/03/20 1150  BP: 118/69 128/77  Pulse: 80 69  Resp: 12 16  Temp:  36.5 C  SpO2: 97% 98%    Last Pain:  Vitals:   11/03/20 1150  TempSrc:   PainSc: [place]

## 2020-11-03 NOTE — Anesthesia Procedure Notes (Signed)
Procedure Name: LMA Insertion Date/Time: 11/03/2020 10:13 AM Performed by: Ezequiel Kayser, CRNA Pre-anesthesia Checklist: Patient identified, Emergency Drugs available, Suction available and Patient being monitored Patient Re-evaluated:Patient Re-evaluated prior to induction Oxygen Delivery Method: Circle System Utilized Preoxygenation: Pre-oxygenation with 100% oxygen Induction Type: IV induction Ventilation: Mask ventilation without difficulty LMA: LMA inserted LMA Size: 3.0 Number of attempts: 1 Airway Equipment and Method: Bite block Placement Confirmation: positive ETCO2 Tube secured with: Tape Dental Injury: Teeth and Oropharynx as per pre-operative assessment

## 2020-11-04 ENCOUNTER — Encounter (HOSPITAL_BASED_OUTPATIENT_CLINIC_OR_DEPARTMENT_OTHER): Payer: Self-pay | Admitting: Surgery

## 2020-11-04 LAB — SURGICAL PATHOLOGY

## 2020-11-07 ENCOUNTER — Encounter: Payer: Self-pay | Admitting: Surgery

## 2020-12-12 ENCOUNTER — Other Ambulatory Visit: Payer: Self-pay

## 2020-12-12 ENCOUNTER — Ambulatory Visit (HOSPITAL_COMMUNITY): Payer: Medicare Other | Admitting: Anesthesiology

## 2020-12-12 ENCOUNTER — Encounter (HOSPITAL_COMMUNITY)
Admission: RE | Disposition: A | Discharge status: Home / Self Care | Payer: Self-pay | Source: Home / Self Care | Attending: Gastroenterology

## 2020-12-12 ENCOUNTER — Ambulatory Visit (HOSPITAL_COMMUNITY)
Admission: RE | Admit: 2020-12-12 | Discharge: 2020-12-12 | Disposition: A | Discharge status: Home / Self Care | Payer: Medicare Other | Attending: Gastroenterology | Admitting: Gastroenterology

## 2020-12-12 ENCOUNTER — Encounter (HOSPITAL_COMMUNITY): Payer: Self-pay | Admitting: Gastroenterology

## 2020-12-12 DIAGNOSIS — Q399 Congenital malformation of esophagus, unspecified: Secondary | ICD-10-CM | POA: Insufficient documentation

## 2020-12-12 DIAGNOSIS — K219 Gastro-esophageal reflux disease without esophagitis: Secondary | ICD-10-CM | POA: Insufficient documentation

## 2020-12-12 DIAGNOSIS — K449 Diaphragmatic hernia without obstruction or gangrene: Secondary | ICD-10-CM | POA: Diagnosis not present

## 2020-12-12 DIAGNOSIS — Z87891 Personal history of nicotine dependence: Secondary | ICD-10-CM | POA: Insufficient documentation

## 2020-12-12 DIAGNOSIS — R1310 Dysphagia, unspecified: Secondary | ICD-10-CM | POA: Diagnosis present

## 2020-12-12 DIAGNOSIS — Z800 Family history of malignant neoplasm of digestive organs: Secondary | ICD-10-CM

## 2020-12-12 DIAGNOSIS — Z85820 Personal history of malignant melanoma of skin: Secondary | ICD-10-CM | POA: Insufficient documentation

## 2020-12-12 HISTORY — PX: SAVORY DILATION: SHX5439

## 2020-12-12 HISTORY — PX: EUS: SHX5427

## 2020-12-12 HISTORY — PX: ESOPHAGOGASTRODUODENOSCOPY (EGD) WITH PROPOFOL: SHX5813

## 2020-12-12 SURGERY — UPPER ENDOSCOPIC ULTRASOUND (EUS) RADIAL
Anesthesia: Monitor Anesthesia Care

## 2020-12-12 MED ORDER — LACTATED RINGERS IV SOLN: INTRAVENOUS | Status: DC

## 2020-12-12 MED ORDER — LIDOCAINE 2% (20 MG/ML) 5 ML SYRINGE
INTRAMUSCULAR | Status: DC | PRN
  Administered 2020-12-12: 100 mg via INTRAVENOUS

## 2020-12-12 MED ORDER — PROPOFOL 500 MG/50ML IV EMUL
INTRAVENOUS | Status: AC
  Filled 2020-12-12: qty 50

## 2020-12-12 MED ORDER — PROPOFOL 500 MG/50ML IV EMUL
INTRAVENOUS | Status: DC | PRN
  Administered 2020-12-12: 125 ug/kg/min via INTRAVENOUS

## 2020-12-12 MED ORDER — SODIUM CHLORIDE 0.9 % IV SOLN: INTRAVENOUS | Status: DC

## 2020-12-12 MED ORDER — PROPOFOL 10 MG/ML IV BOLUS
INTRAVENOUS | Status: DC | PRN
  Administered 2020-12-12: 10 mg via INTRAVENOUS
  Administered 2020-12-12: 20 mg via INTRAVENOUS
  Administered 2020-12-12: 10 mg via INTRAVENOUS
  Administered 2020-12-12 (×2): 20 mg via INTRAVENOUS
  Administered 2020-12-12 (×2): 10 mg via INTRAVENOUS
  Administered 2020-12-12: 20 mg via INTRAVENOUS
  Administered 2020-12-12: 10 mg via INTRAVENOUS
  Administered 2020-12-12: 20 mg via INTRAVENOUS

## 2020-12-12 NOTE — Op Note (Signed)
[facility] Patient Name: [name] Procedure Date: 12/12/2020 MRN: [id number] Attending MD: Justice Britain , MD Date of Birth: March 09, 1951 CSN: [id number] Age: 70 Admit Type: Outpatient Procedure:                Upper EUS Indications:              Screening for pancreatic neoplasm, Dysphagia Providers:                Justice Britain, MD, Jeanella Cara, RN,                            Tyrone Apple, Technician, Luan Moore,                            Technician, Edman Circle. Zenia Resides CRNA, CRNA Referring MD:             Pricilla Riffle. Fuller Plan, MD, Josetta Huddle MD, MD Medicines:                Monitored Anesthesia Care Complications:            No immediate complications. Estimated Blood Loss:     Estimated blood loss: none. Procedure:                Pre-Anesthesia Assessment:                           - Prior to the procedure, a History and Physical                            was performed, and patient medications and                            allergies were reviewed. The patient's tolerance of                            previous anesthesia was also reviewed. The risks                            and benefits of the procedure and the sedation                            options and risks were discussed with the patient.                            All questions were answered, and informed consent                            was obtained. Prior Anticoagulants: The patient has                            taken no previous anticoagulant or antiplatelet                            agents except for NSAID medication. ASA Grade  Assessment: II - A patient with mild systemic                            disease. After reviewing the risks and benefits,                            the patient was deemed in satisfactory condition to                            undergo the procedure.                           After obtaining informed consent, the  endoscope was                            passed under direct vision. Throughout the                            procedure, the patient's blood pressure, pulse, and                            oxygen saturations were monitored continuously. The                            [id number]-H190 ([id number]) Olympus endoscope was introduced                            through the mouth, and advanced to the second part                            of duodenum. The [id number]-Q180V ([id number]) Olympus                            duodenoscope was introduced through the mouth, and                            advanced to the area of papilla. The [id number]                            ([id number]) Olympus linear ultrasound scope was                            introduced through the mouth, and advanced to the                            duodenum for ultrasound examination from the                            stomach and duodenum. The upper EUS was                            accomplished without difficulty. The patient  tolerated the procedure. Scope In: Scope Out: Findings:      ENDOSCOPIC FINDING: :      The examined esophagus was moderately tortuous.      No gross mucosal lesions were noted in the entire esophagus. After the       completion of the rest of the procedure including EUS, a guidewire was       placed and the scope was withdrawn. Dilation was performed with a Savary       dilator with mild resistance at 16 mm and moderate resistance at 17 mm.       The dilation site was examined following endoscope reinsertion and       showed mild mucosal disruption and moderate improvement in luminal       narrowing in the region just distal to the UES and no perforation.      The Z-line was regular and was found 39 cm from the incisors.      A 3 cm hiatal hernia was present.      No gross lesions were noted in the entire examined stomach.      No gross lesions were noted in the duodenal bulb, in the first  portion       of the duodenum and in the second portion of the duodenum.      The major papilla was flat but otherwise normal.      ENDOSONOGRAPHIC FINDING: :      There was no sign of significant endosonographic abnormality in the       pancreatic head (PD = 1.2 -> 1.3 mm), genu of the pancreas (PD = 1.9       mm), pancreatic body (PD = 2.1 mm) and pancreatic tail (PD = 1.4 -> 1.2       mm). No masses, no cysts, no calcifications, the pancreatic duct was       regular in contour.      There was no sign of significant endosonographic abnormality in the       common bile duct (2.3 mm -> 4.4 mm) and in the common hepatic duct. An       unremarkable gallbladder, no stones, no biliary sludge and ducts with       regular contour were identified.      Endosonographic imaging of the ampulla showed no extrinsic compression,       intramural (subepithelial) lesion, mass, varices or wall thickening.      Endosonographic imaging in the visualized portion of the liver showed no       mass.      No malignant-appearing lymph nodes were visualized in the celiac region       (level 20), peripancreatic region and porta hepatis region.      The celiac region was visualized. Impression:               EGD Impression:                           - Tortuous esophagus. No gross mucosal lesions in                            esophagus. Dilated with mucosal wrent just distal                            to UES.                           -  Z-line regular, 39 cm from the incisors.                           - 3 cm hiatal hernia.                           - No gross lesions in the stomach.                           - No gross lesions in the duodenal bulb, in the                            first portion of the duodenum and in the second                            portion of the duodenum.                           - Flat but otherwise normal major papilla.                           EUS Impression:                            - There was no sign of significant pathology in the                            pancreatic head, genu of the pancreas, pancreatic                            body and pancreatic tail.                           - There was no sign of significant pathology in the                            common bile duct and in the common hepatic duct.                           - No malignant-appearing lymph nodes were                            visualized in the celiac region (level 20),                            peripancreatic region and porta hepatis region. Moderate Sedation:      Not Applicable - Patient had care per Anesthesia. Recommendation:           - The patient will be observed post-procedure,                            until all discharge criteria are met.                           -  Discharge patient to home.                           - Patient has a contact number available for                            emergencies. The signs and symptoms of potential                            delayed complications were discussed with the                            patient. Return to normal activities tomorrow.                            Written discharge instructions were provided to the                            patient.                           - Dilation diet as per protocol.                           - Observe patient's clinical course.                           - Await path results.                           - Recommend taking PPI daily for next 65-monthto                            help with healing post-dilation.                           - Consider further dilation pending patient's                            dysphagia outcome post dilation with primary GI.                           - Patient to remain in High-risk screening cohort.                            Will plan a 660-monthRI/MRCP. Follow up EUS in                            1264-monthpending no findings on MRI/MRCP).                            - The findings and recommendations were discussed                            with the patient.                           -  The findings and recommendations were discussed                            with the patient's family. Procedure Code(s):        --- Professional ---                           [phone], Esophagogastroduodenoscopy, flexible,                            transoral; with endoscopic ultrasound examination                            limited to the esophagus, stomach or duodenum, and                            adjacent structures                           43248, Esophagogastroduodenoscopy, flexible,                            transoral; with insertion of guide wire followed by                            passage of dilator(s) through esophagus over guide                            wire Diagnosis Code(s):        --- Professional ---                           Q39.9, Congenital malformation of esophagus,                            unspecified                           K44.9, Diaphragmatic hernia without obstruction or                            gangrene                           I89.9, Noninfective disorder of lymphatic vessels                            and lymph nodes, unspecified                           Z12.89, Encounter for screening for malignant                            neoplasm of other sites                           R13.10, Dysphagia, unspecified CPT copyright 2019 American Medical Association. All rights reserved. The codes documented in this  report are preliminary and upon coder review may  be revised to meet current compliance requirements. Justice Britain, MD 12/12/2020 11:55:37 AM Number of Addenda: 0

## 2020-12-12 NOTE — Anesthesia Preprocedure Evaluation (Addendum)
Anesthesia Evaluation  Patient identified by MRN, date of birth, ID band Patient awake    Reviewed: Allergy & Precautions, H&P , NPO status , Patient's Chart, lab work & pertinent test results  Airway Mallampati: III  TM Distance: >3 FB Neck ROM: Full    Dental no notable dental hx. (+) Teeth Intact, Dental Advisory Given   Pulmonary neg pulmonary ROS, former smoker,    Pulmonary exam normal breath sounds clear to auscultation       Cardiovascular + dysrhythmias Supra Ventricular Tachycardia  Rhythm:Regular Rate:Normal     Neuro/Psych Anxiety Depression negative neurological ROS     GI/Hepatic Neg liver ROS, hiatal hernia, GERD  Medicated,  Endo/Other  negative endocrine ROS  Renal/GU negative Renal ROS  negative genitourinary   Musculoskeletal  (+) Arthritis , Osteoarthritis,    Abdominal   Peds  Hematology negative hematology ROS (+)   Anesthesia Other Findings   Reproductive/Obstetrics negative OB ROS                            Anesthesia Physical Anesthesia Plan  ASA: 2  Anesthesia Plan: MAC   Post-op Pain Management:    Induction: Intravenous  PONV Risk Score and Plan: 2 and Propofol infusion and Treatment may vary due to age or medical condition  Airway Management Planned: Nasal Cannula  Additional Equipment:   Intra-op Plan:   Post-operative Plan:   Informed Consent: I have reviewed the patients History and Physical, chart, labs and discussed the procedure including the risks, benefits and alternatives for the proposed anesthesia with the patient or authorized representative who has indicated his/her understanding and acceptance.     Dental advisory given  Plan Discussed with: CRNA  Anesthesia Plan Comments:         Anesthesia Quick Evaluation

## 2020-12-12 NOTE — Anesthesia Procedure Notes (Signed)
Procedure Name: MAC Date/Time: 12/12/2020 11:00 AM Performed by: Lollie Sails, CRNA Pre-anesthesia Checklist: Patient identified, Emergency Drugs available, Suction available, Patient being monitored and Timeout performed Oxygen Delivery Method: Simple face mask Preoxygenation: POM used. Placement Confirmation: positive ETCO2

## 2020-12-12 NOTE — Anesthesia Postprocedure Evaluation (Signed)
Anesthesia Post Note  Patient: [name]  Procedure(s) Performed: UPPER ENDOSCOPIC ULTRASOUND (EUS) RADIAL SAVORY DILATION     Patient location during evaluation: Endoscopy Anesthesia Type: MAC Level of consciousness: awake and alert Pain management: pain level controlled Vital Signs Assessment: post-procedure vital signs reviewed and stable Respiratory status: spontaneous breathing, nonlabored ventilation and respiratory function stable Cardiovascular status: stable and blood pressure returned to baseline Postop Assessment: no apparent nausea or vomiting Anesthetic complications: no   No notable events documented.  Last Vitals:  Vitals:   12/12/20 1200 12/12/20 1210  BP: 125/77 118/66  Pulse: (!) 50 (!) 47  Resp: 13 16  Temp:    SpO2: 99% 100%    Last Pain:  Vitals:   12/12/20 1210  TempSrc:   PainSc: 3                  Abdulah Iqbal,W. EDMOND

## 2020-12-12 NOTE — Transfer of Care (Signed)
Immediate Anesthesia Transfer of Care Note  Patient: [name]  Procedure(s) Performed: UPPER ENDOSCOPIC ULTRASOUND (EUS) RADIAL SAVORY DILATION  Patient Location: PACU and Endoscopy Unit  Anesthesia Type:MAC  Level of Consciousness: awake and drowsy  Airway & Oxygen Therapy: Patient Spontanous Breathing and Patient connected to face mask oxygen  Post-op Assessment: Report given to RN and Post -op Vital signs reviewed and stable  Post vital signs: Reviewed and stable  Last Vitals:  Vitals Value Taken Time  BP 111/63 12/12/20 1145  Temp    Pulse 51 12/12/20 1145  Resp 14 12/12/20 1145  SpO2 100 % 12/12/20 1145  Vitals shown include unvalidated device data.  Last Pain:  Vitals:   12/12/20 1019  TempSrc: Oral  PainSc: 0-No pain         Complications: No notable events documented.

## 2020-12-12 NOTE — H&P (Signed)
GASTROENTEROLOGY PROCEDURE H&P NOTE   Primary Care Physician: Josetta Huddle, MD  HPI: [name] is a 70 y.o. 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  Patient also describes history of dysphagia for potential dilation.  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[place])    Other acquired calcaneus deformity    Personal history of colonic polyps 2002   hyperplastic   Personal history of malignant melanoma    Personal history of squamous cell carcinoma of skin    Plantar fascial fibromatosis    Rotator cuff syndrome of shoulder and allied disorders    Skin cancer    Syncope    Past Surgical History:  Procedure Laterality Date   ABLATION OF DYSRHYTHMIC FOCUS  07/31/2013   DR Lovena Le   ARTHROPLASTY  2006   thumb/wrist    AUGMENTATION MAMMAPLASTY Bilateral    BRAVO [place] STUDY  02/01/2011   Procedure: BRAVO [place] [facility] STUDY;  Surgeon: Inda Castle, MD;  Location: [facility];  Service: Endoscopy;  Laterality: N/A;   BREAST LUMPECTOMY WITH RADIOACTIVE SEED LOCALIZATION Left 11/03/2020   Procedure: LEFT BREAST LUMPECTOMY WITH RADIOACTIVE SEED LOCALIZATION;  Surgeon: Erroll Luna, MD;  Location: [place];  Service: General;  Laterality: Left;   BUNIONECTOMY     CESAREAN SECTION     x3   COLONOSCOPY  07/16/2012    ESOPHAGOGASTRODUODENOSCOPY  02/01/2011   PARTIAL HYSTERECTOMY     Still has ovaries   PLACEMENT OF BREAST IMPLANTS     POLYPECTOMY     SHOULDER SURGERY     SUPRAVENTRICULAR TACHYCARDIA ABLATION N/A 07/31/2013   Procedure: SUPRAVENTRICULAR TACHYCARDIA ABLATION;  Surgeon: Evans Lance, MD;  Location: [facility] CATH LAB;  Service: Cardiovascular;  Laterality: N/A;   Current Facility-Administered Medications  Medication Dose Route Frequency Provider Last Rate Last Admin   0.9 %  sodium chloride infusion   Intravenous Continuous Mansouraty, Telford Nab., MD       lactated ringers infusion   Intravenous Continuous Mansouraty, Telford Nab., MD 10 mL/hr at 12/12/20 1039 Continued from Pre-op at 12/12/20 1039    Current Facility-Administered Medications:    0.9 %  sodium chloride infusion, , Intravenous, Continuous, Mansouraty, Telford Nab., MD   lactated ringers infusion, , Intravenous, Continuous, Mansouraty, Telford Nab., MD, Last Rate: 10 mL/hr at 12/12/20 1039, Continued from Pre-op at 12/12/20 1039 No Known Allergies Family History  Problem Relation Age of Onset   Colon cancer Father 53   Heart disease Mother    Breast cancer Maternal Aunt        dx 82s   Lung cancer Maternal Aunt        dx  23s, smoker   Ovarian cancer Cousin        dx 62s, maternal first cousin   Lung cancer Maternal Grandfather        dx 52s, smoker   Colon cancer Maternal Uncle        dx 26s   Lung cancer Other        MGF's brother   Colon cancer Nephew 33   Dementia Maternal Aunt    Breast cancer Cousin        dx 34s, maternal first cousin   Ovarian cancer Cousin 46       dysgerminoma, maternal first cousin   Lung cancer Other        MGF's brother   Colon polyps Neg Hx    Esophageal cancer Neg Hx    Rectal cancer Neg Hx    Stomach cancer Neg Hx    Social History   Socioeconomic History   Marital status: Divorced    Spouse name: Not on file   Number of children: 3   Years of education: Not on file    Highest education level: Not on file  Occupational History   Occupation: Careers information officer    Employer: [employer]  Tobacco Use   Smoking status: Former    Types: Cigarettes    Quit date: 03/19/1978    Years since quitting: 42.7   Smokeless tobacco: Never  Vaping Use   Vaping Use: Never used  Substance and Sexual Activity   Alcohol use: Not Currently    Comment: rarely- twice a yr if that    Drug use: No   Sexual activity: Not Currently    Birth control/protection: Surgical    Comment: partial hyst  Other Topics Concern   Not on file  Social History Narrative   Not on file   Social Determinants of Health   Financial Resource Strain: Not on file  Food Insecurity: Not on file  Transportation Needs: Not on file  Physical Activity: Not on file  Stress: Not on file  Social Connections: Not on file  Intimate Partner Violence: Not on file    Physical Exam: Today's Vitals   12/12/20 1019  BP: 131/79  Pulse: (!) 55  Resp: 18  Temp: 98.3 F (36.8 C)  TempSrc: Oral  SpO2: 100%  Weight: 52.6 kg  Height: 5\' 4"  (1.626 m)  PainSc: 0-No pain   Body mass index is 19.91 kg/m. GEN: NAD EYE: Sclerae anicteric ENT: MMM CV: Non-tachycardic GI: Soft, NT/ND NEURO:  Alert & Oriented x 3  Lab Results: No results for input(s): WBC, HGB, HCT, PLT in the last 72 hours. BMET No results for input(s): NA, K, CL, CO2, GLUCOSE, BUN, CREATININE, CALCIUM in the last 72 hours. LFT No results for input(s): PROT, ALBUMIN, AST, ALT, ALKPHOS, BILITOT, BILIDIR, IBILI in the last 72 hours. PT/INR No results for input(s): LABPROT, INR in the last 72 hours.   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  Patient also describes history of dysphagia for potential dilation.  The risks of an EUS including intestinal perforation, bleeding,  infection, aspiration, and medication effects were discussed as was the possibility it may not give a definitive diagnosis if a biopsy is performed.  When a biopsy of the pancreas is done as part of the EUS, there is an additional risk of pancreatitis at the  rate of about 1-2%.  It was explained that procedure related pancreatitis is typically mild, although it can be severe and even life threatening, which is why we do not perform random pancreatic biopsies and only biopsy a lesion/area we feel is concerning enough to warrant the risk.   The risks and benefits of endoscopic evaluation/treatment were discussed with the patient and/or family; these include but are not limited to the risk of perforation, infection, bleeding, missed lesions, lack of diagnosis, severe illness requiring hospitalization, as well as anesthesia and sedation related illnesses.  The patient's history has been reviewed, patient examined, no change in status, and deemed stable for procedure.  The patient and/or family is agreeable to proceed.    Justice Britain, MD [facility] Office # [phone]

## 2020-12-13 ENCOUNTER — Telehealth: Payer: Self-pay

## 2020-12-13 NOTE — Progress Notes (Signed)
Vito and Pender,  This patient is in the pancreatic cancer screening protocol however I am not sure how the protocol is structured. Will a 6 month MRI/MRCP alternating with an EUS be scheduled by protocol or do I need to keep a reminder system myself for scheduling?  Thanks, MS

## 2020-12-13 NOTE — Telephone Encounter (Signed)
Recall EUS entered for 1 year with GM.  Message sent to Barb Merino RN to set up follow up appts with Dr Fuller Plan in 6 months.

## 2020-12-13 NOTE — Telephone Encounter (Signed)
-----   Message from Irving Copas., MD sent at 12/13/2020  3:22 PM EDT ----- Chong Sicilian, This patient will need an MRI/MRCP for high risk pancreas cancer screening in 6 months.  That can be placed under Dr. Lynne Leader name.  Patient needs to have a clinic visit with Dr. Fuller Plan within 1 to 2 weeks of the MRI being completed. Tentative plan for 1 year EUS (6 months from the MRI being completed).  MS, did a dilation for your patient and she said she would call back if she felt that her dysphagia symptoms were worsening or needed further evaluation.  I got a mucosal wrent when I got up to 17 mm.  Thanks. GM

## 2020-12-14 ENCOUNTER — Encounter (HOSPITAL_COMMUNITY): Payer: Self-pay | Admitting: Gastroenterology

## 2021-04-05 ENCOUNTER — Other Ambulatory Visit: Payer: Self-pay | Admitting: Student

## 2021-04-05 DIAGNOSIS — N644 Mastodynia: Secondary | ICD-10-CM

## 2021-04-26 ENCOUNTER — Ambulatory Visit
Admission: RE | Admit: 2021-04-26 | Discharge: 2021-04-26 | Disposition: A | Discharge status: Home / Self Care | Payer: Medicare Other | Source: Ambulatory Visit | Attending: Student | Admitting: Student

## 2021-04-26 ENCOUNTER — Other Ambulatory Visit: Payer: Self-pay | Admitting: Student

## 2021-04-26 DIAGNOSIS — N644 Mastodynia: Secondary | ICD-10-CM

## 2021-04-26 DIAGNOSIS — N6002 Solitary cyst of left breast: Secondary | ICD-10-CM

## 2021-04-28 ENCOUNTER — Other Ambulatory Visit: Payer: Self-pay | Admitting: Surgery

## 2021-04-28 DIAGNOSIS — Z1239 Encounter for other screening for malignant neoplasm of breast: Secondary | ICD-10-CM

## 2021-05-10 ENCOUNTER — Other Ambulatory Visit: Payer: Medicare Other

## 2021-05-15 ENCOUNTER — Ambulatory Visit
Admission: RE | Admit: 2021-05-15 | Discharge: 2021-05-15 | Disposition: A | Discharge status: Home / Self Care | Payer: Medicare Other | Source: Ambulatory Visit | Attending: Student | Admitting: Student

## 2021-05-15 ENCOUNTER — Other Ambulatory Visit: Payer: Self-pay | Admitting: Student

## 2021-05-15 DIAGNOSIS — N6002 Solitary cyst of left breast: Secondary | ICD-10-CM

## 2021-05-15 DIAGNOSIS — N6320 Unspecified lump in the left breast, unspecified quadrant: Secondary | ICD-10-CM

## 2021-05-17 DIAGNOSIS — C50919 Malignant neoplasm of unspecified site of unspecified female breast: Secondary | ICD-10-CM

## 2021-05-17 HISTORY — DX: Malignant neoplasm of unspecified site of unspecified female breast: C50.919

## 2021-05-19 ENCOUNTER — Other Ambulatory Visit: Payer: Self-pay | Admitting: Obstetrics and Gynecology

## 2021-05-19 DIAGNOSIS — N6310 Unspecified lump in the right breast, unspecified quadrant: Secondary | ICD-10-CM

## 2021-06-12 ENCOUNTER — Telehealth: Payer: Self-pay

## 2021-06-12 ENCOUNTER — Other Ambulatory Visit: Payer: Self-pay

## 2021-06-12 DIAGNOSIS — Z800 Family history of malignant neoplasm of digestive organs: Secondary | ICD-10-CM

## 2021-06-12 DIAGNOSIS — Z129 Encounter for screening for malignant neoplasm, site unspecified: Secondary | ICD-10-CM

## 2021-06-12 DIAGNOSIS — Z1501 Genetic susceptibility to malignant neoplasm of breast: Secondary | ICD-10-CM

## 2021-06-12 NOTE — Telephone Encounter (Signed)
-----   Message from Yevette Edwards, RN sent at 06/12/2021  4:12 PM EDT ----- ? ?----- Message ----- ?From: Timothy Lasso, RN ?Sent: 06/12/2021  12:00 AM EDT ?To: Marlon Pel, RN ? ? Message from Irving Copas., MD sent at 12/13/2020  3:22 PM EDT ----- ?This patient will need an MRI/MRCP for high risk pancreas cancer screening in 6 months.  That can be placed under Dr. Lynne Leader name.  Patient needs to have a clinic visit with Dr. Fuller Plan within 1 to 2 weeks of the MRI being completed. ? ? ?

## 2021-06-12 NOTE — Telephone Encounter (Signed)
6 month follow up MRI/MRCP ordered. Schedulers have been notified. ?

## 2021-06-13 ENCOUNTER — Telehealth: Payer: Self-pay

## 2021-06-13 NOTE — Telephone Encounter (Signed)
-----   Message from April H Pait sent at 06/13/2021 11:15 AM EDT ----- ? ?----- Message ----- ?From: Cathlean Cower ?Sent: 06/13/2021  10:59 AM EDT ?To: April H Pait ? ?Patient will call back later ?----- Message ----- ?From: Pait, April H ?Sent: 06/12/2021   4:39 PM EDT ?To: Cathlean Cower ? ? ?----- Message ----- ?From: Carl Best, RN ?Sent: 06/12/2021   4:27 PM EDT ?To: April H Pait, Roosvelt Maser ? ?Patient will need to be scheduled for MRI/MRCP. Orders are in. Thank you! ? ? ? ? ?

## 2021-06-14 ENCOUNTER — Other Ambulatory Visit: Payer: Self-pay | Admitting: Obstetrics and Gynecology

## 2021-06-14 ENCOUNTER — Ambulatory Visit
Admission: RE | Admit: 2021-06-14 | Discharge: 2021-06-14 | Disposition: A | Discharge status: Home / Self Care | Payer: Medicare Other | Source: Ambulatory Visit | Attending: Obstetrics and Gynecology | Admitting: Obstetrics and Gynecology

## 2021-06-14 DIAGNOSIS — N6310 Unspecified lump in the right breast, unspecified quadrant: Secondary | ICD-10-CM

## 2021-06-14 DIAGNOSIS — N6320 Unspecified lump in the left breast, unspecified quadrant: Secondary | ICD-10-CM

## 2021-06-20 ENCOUNTER — Ambulatory Visit
Admission: RE | Admit: 2021-06-20 | Discharge: 2021-06-20 | Disposition: A | Discharge status: Home / Self Care | Payer: Medicare Other | Source: Ambulatory Visit | Attending: Obstetrics and Gynecology | Admitting: Obstetrics and Gynecology

## 2021-06-20 DIAGNOSIS — N6310 Unspecified lump in the right breast, unspecified quadrant: Secondary | ICD-10-CM

## 2021-06-20 HISTORY — PX: BREAST BIOPSY: SHX20

## 2021-07-05 ENCOUNTER — Encounter (HOSPITAL_COMMUNITY): Payer: Self-pay

## 2021-07-10 ENCOUNTER — Ambulatory Visit: Payer: Self-pay | Admitting: Surgery

## 2021-07-10 ENCOUNTER — Other Ambulatory Visit: Payer: Self-pay | Admitting: *Deleted

## 2021-07-10 ENCOUNTER — Other Ambulatory Visit: Payer: Self-pay | Admitting: Surgery

## 2021-07-10 DIAGNOSIS — D0511 Intraductal carcinoma in situ of right breast: Secondary | ICD-10-CM

## 2021-07-10 DIAGNOSIS — D0510 Intraductal carcinoma in situ of unspecified breast: Secondary | ICD-10-CM

## 2021-07-11 ENCOUNTER — Telehealth: Payer: Self-pay | Admitting: Hematology and Oncology

## 2021-07-11 ENCOUNTER — Other Ambulatory Visit: Payer: Self-pay

## 2021-07-11 ENCOUNTER — Encounter (HOSPITAL_BASED_OUTPATIENT_CLINIC_OR_DEPARTMENT_OTHER): Payer: Self-pay | Admitting: Surgery

## 2021-07-11 ENCOUNTER — Other Ambulatory Visit: Payer: Self-pay | Admitting: Gastroenterology

## 2021-07-11 NOTE — Telephone Encounter (Signed)
Scheduled appt per 4/24 referral. Pt is aware of appt date and time. Pt is aware to arrive 15 mins prior to appt time and to bring and updated insurance card. Pt is aware of appt location.   ?

## 2021-07-12 MED ORDER — CHLORHEXIDINE GLUCONATE CLOTH 2 % EX PADS: 6.0000 | MEDICATED_PAD | Freq: Once | CUTANEOUS | Status: DC

## 2021-07-12 NOTE — Progress Notes (Incomplete)
New Breast Cancer Diagnosis: Right Breast ? ?Did patient present with symptoms (if so, please note symptoms) or screening mammography?:Screening Mass   ? ?Location and Extent of disease :right breast. Located at 9 o'clock position, measured  1.1 cm in greatest dimension. Adenopathy no. ? ?Histology per Pathology Report: grade 2, DCIS ? ?Receptor Status: ER(positive), PR (positive), Her2-neu (), Ki-(%) ? ?Surgeon and surgical plan, if any:  ?Dr. Brantley Stage ?- Right Breast Lumpectomy with radioactive seed localization 07/19/2021 ? ? ?Medical oncologist, treatment if any:   ?Dr. Lindi Adie 07/27/2021 ? ? ?Family History of Breast/Ovarian/Prostate Cancer: Maternal Aunt and Cousin had breast cancer. ? ? ?Lymphedema issues, if any:  {:18581} {t:21944}  ? ?Pain issues, if any:  {:18581} {PAIN DESCRIPTION:[id number]} ? ?SAFETY ISSUES: ?Prior radiation? {:18581} ?Pacemaker/ICD? {:18581} ?Possible current pregnancy?Hysterectomy ?Is the patient on methotrexate? {:18581} ? ?Current Complaints / other details:   ?-Left Breast Lumpectomy  ? ?-Bilateral Retropectoral Breast Implants-saline based, 2001 ?

## 2021-07-12 NOTE — Progress Notes (Signed)
? ? ? ? ?  Enhanced Recovery after Surgery for Orthopedics ?Enhanced Recovery after Surgery is a protocol used to improve the stress on your body and your recovery after surgery. ? ?Patient Instructions ? ?The night before surgery:  ?No food after midnight. ONLY clear liquids after midnight ? ?The day of surgery (if you do NOT have diabetes):  ?Drink ONE (1) Pre-Surgery Clear Ensure as directed.   ?This drink was given to you during your hospital  ?pre-op appointment visit. ?The pre-op nurse will instruct you on the time to drink the  ?Pre-Surgery Ensure depending on your surgery time. ?Finish the drink at the designated time by the pre-op nurse.  ?Nothing else to drink after completing the  ?Pre-Surgery Clear Ensure. ? ?The day of surgery (if you have diabetes): ?Drink ONE (1) Gatorade 2 (G2) as directed. ?This drink was given to you during your hospital  ?pre-op appointment visit.  ?The pre-op nurse will instruct you on the time to drink the  ? Gatorade 2 (G2) depending on your surgery time. ?Color of the Gatorade may vary. Red is not allowed. ?Nothing else to drink after completing the  ?Gatorade 2 (G2). ? ?       If you have questions, please contact your surgeon?s office. ? ?Patient received surgical soap with instructions. Patient verbalized understanding. ?

## 2021-07-13 ENCOUNTER — Ambulatory Visit
Admission: RE | Admit: 2021-07-13 | Discharge: 2021-07-13 | Disposition: A | Discharge status: Home / Self Care | Payer: Medicare Other | Source: Ambulatory Visit | Attending: Radiation Oncology | Admitting: Radiation Oncology

## 2021-07-13 ENCOUNTER — Other Ambulatory Visit: Payer: Self-pay

## 2021-07-13 ENCOUNTER — Encounter: Payer: Self-pay | Admitting: Radiation Oncology

## 2021-07-13 VITALS — BP 111/75 | HR 58 | Temp 96.9°F | Resp 18 | Ht 64.0 in | Wt 115.4 lb

## 2021-07-13 DIAGNOSIS — K219 Gastro-esophageal reflux disease without esophagitis: Secondary | ICD-10-CM | POA: Insufficient documentation

## 2021-07-13 DIAGNOSIS — Z87891 Personal history of nicotine dependence: Secondary | ICD-10-CM | POA: Insufficient documentation

## 2021-07-13 DIAGNOSIS — Z8041 Family history of malignant neoplasm of ovary: Secondary | ICD-10-CM | POA: Diagnosis not present

## 2021-07-13 DIAGNOSIS — Z79899 Other long term (current) drug therapy: Secondary | ICD-10-CM | POA: Diagnosis not present

## 2021-07-13 DIAGNOSIS — F419 Anxiety disorder, unspecified: Secondary | ICD-10-CM | POA: Diagnosis not present

## 2021-07-13 DIAGNOSIS — Z800 Family history of malignant neoplasm of digestive organs: Secondary | ICD-10-CM | POA: Diagnosis not present

## 2021-07-13 DIAGNOSIS — Z85820 Personal history of malignant melanoma of skin: Secondary | ICD-10-CM | POA: Diagnosis not present

## 2021-07-13 DIAGNOSIS — Z803 Family history of malignant neoplasm of breast: Secondary | ICD-10-CM | POA: Insufficient documentation

## 2021-07-13 DIAGNOSIS — Z170 Estrogen receptor positive status [ER+]: Secondary | ICD-10-CM | POA: Diagnosis not present

## 2021-07-13 DIAGNOSIS — M722 Plantar fascial fibromatosis: Secondary | ICD-10-CM | POA: Diagnosis not present

## 2021-07-13 DIAGNOSIS — Z79890 Hormone replacement therapy (postmenopausal): Secondary | ICD-10-CM | POA: Insufficient documentation

## 2021-07-13 DIAGNOSIS — Z86010 Personal history of colonic polyps: Secondary | ICD-10-CM | POA: Diagnosis not present

## 2021-07-13 DIAGNOSIS — K589 Irritable bowel syndrome without diarrhea: Secondary | ICD-10-CM | POA: Diagnosis not present

## 2021-07-13 DIAGNOSIS — Z801 Family history of malignant neoplasm of trachea, bronchus and lung: Secondary | ICD-10-CM | POA: Insufficient documentation

## 2021-07-13 DIAGNOSIS — K449 Diaphragmatic hernia without obstruction or gangrene: Secondary | ICD-10-CM | POA: Diagnosis not present

## 2021-07-13 DIAGNOSIS — C50911 Malignant neoplasm of unspecified site of right female breast: Secondary | ICD-10-CM | POA: Insufficient documentation

## 2021-07-13 NOTE — Progress Notes (Signed)
New Breast Cancer Diagnosis: Right Breast ? ?Did patient present with symptoms (if so, please note symptoms) or screening mammography?:Screening Mass   ? ?Location and Extent of disease :right breast. Located at 9 o'clock position, measured  1.1 cm in greatest dimension. Adenopathy no. ? ?Histology per Pathology Report: grade 2, DCIS ? ?Receptor Status: ER(positive), PR (positive), Her2-neu (), Ki-(%) ? ?Surgeon and surgical plan, if any:  ?Dr. Cornett ?- Right Breast Lumpectomy with radioactive seed localization 07/19/2021 ? ? ?Medical oncologist, treatment if any:   ?Dr. Gudena 07/27/2021 ? ? ?Family History of Breast/Ovarian/Prostate Cancer: Maternal Aunt and Cousin had breast cancer. ? ? ?Lymphedema issues, if any:  no   ? ?Pain issues, if any:  no  ? ?SAFETY ISSUES: ?Prior radiation? no ?Pacemaker/ICD? no ?Possible current pregnancy?Hysterectomy ?Is the patient on methotrexate? no ? ?Current Complaints / other details:   ?-Left Breast Lumpectomy  ? ?-Bilateral Retropectoral Breast Implants-saline based, 2001 ?Vitals:  ? 07/13/21 1319  ?BP: 111/75  ?Pulse: (!) 58  ?Resp: 18  ?Temp: (!) 96.9 ?F (36.1 ?C)  ?TempSrc: Temporal  ?SpO2: 100%  ?Weight: 52.3 kg  ?Height: 5' 4" (1.626 m)  ? ? ?

## 2021-07-13 NOTE — Progress Notes (Signed)
?Radiation Oncology         ([phone] ?________________________________ ? ?Name: [name]        MRN: [id number]  ?Date of Service: 07/13/2021 DOB: 24-Jun-1950 ? ?[facility]:NLGXQ, Herbie [place], MD  Erroll Luna, MD    ? ?REFERRING PHYSICIAN: Erroll Luna, MD ? ? ?DIAGNOSIS: The encounter diagnosis was Malignant neoplasm of right breast in female, estrogen receptor positive, unspecified site of breast ([place]). ? ? ?HISTORY OF PRESENT ILLNESS: [name] is a 71 y.o. female seen at the request of Dr. Brantley Stage for a new diagnosis of right breast cancer. The patient was noted to have a screening detected mass in her right breast. Diagnostic imaging revealed a 1.1 cm mass in the 9:00 position. Her axilla was negative for adenopathy. 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 She is scheduled to undergo right lumpectomy on 07/19/21  ? ? ?PREVIOUS RADIATION THERAPY: No ? ? ?PAST MEDICAL HISTORY:  ?Past Medical History:  ?Diagnosis Date  ? Anxiety   ? Arthritis   ? wrist   ? Atrial flutter with rapid ventricular response ([place])   ? SVT per pt   ? Breast cancer ([place]) 05/2021  ? right breast DCIS  ? Bunion   ? Cataract   ? mild   ? Colon polyps   ? Dysrhythmia 07/2013  ? SVT  ? Enthesopathy of ankle and tarsus, unspecified   ? Esophageal reflux   ? Family history of breast cancer   ? Family history of lung cancer   ? Family history of malignant neoplasm of gastrointestinal tract   ? Family history of ovarian cancer   ? Hiatal hernia   ? Irritable bowel syndrome   ? Monoallelic mutation of JJHE1D gene   ? 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[place])   ? Other acquired calcaneus deformity   ? Personal history of colonic polyps 2002  ? hyperplastic  ? Personal history of malignant melanoma   ? Personal history of squamous cell carcinoma of skin   ? Plantar fascial fibromatosis   ? Rotator cuff syndrome of shoulder and allied disorders   ? Skin cancer   ? Syncope   ?   ? ? ?PAST SURGICAL  HISTORY: ?Past Surgical History:  ?Procedure Laterality Date  ? ABLATION OF DYSRHYTHMIC FOCUS  07/31/2013  ? DR Lovena Le  ? ARTHROPLASTY  2006  ? thumb/wrist   ? AUGMENTATION MAMMAPLASTY Bilateral   ? BRAVO PH STUDY  02/01/2011  ? Procedure: BRAVO [place] STUDY;  Surgeon: Inda Castle, MD;  [facility];  Service: Endoscopy;  Laterality: N/A;  ? BREAST BIOPSY Left 09/2020  ? BREAST BIOPSY Right 06/20/2021  ? BREAST EXCISIONAL BIOPSY Left 10/2020  ? BREAST LUMPECTOMY WITH RADIOACTIVE SEED LOCALIZATION Left 11/03/2020  ? Procedure: LEFT BREAST LUMPECTOMY WITH RADIOACTIVE SEED LOCALIZATION;  Surgeon: Erroll Luna, MD;  Location: [place];  Service: General;  Laterality: Left;  ? BUNIONECTOMY    ? CESAREAN SECTION    ? x3  ? COLONOSCOPY  07/16/2012  ? ESOPHAGOGASTRODUODENOSCOPY  02/01/2011  ? ESOPHAGOGASTRODUODENOSCOPY (EGD) WITH PROPOFOL N/A 12/12/2020  ? Procedure: ESOPHAGOGASTRODUODENOSCOPY (EGD) WITH PROPOFOL;  Surgeon: Rush Landmark Telford Nab., MD;  Location: [facility];  Service: Gastroenterology;  Laterality: N/A;  ? EUS N/A 12/12/2020  ? Procedure: UPPER ENDOSCOPIC ULTRASOUND (EUS) RADIAL;  Surgeon: Rush Landmark Telford Nab., MD;  [facility];  Service: Gastroenterology;  Laterality: N/A;  ? PARTIAL HYSTERECTOMY    ? Still has ovaries  ?  PLACEMENT OF BREAST IMPLANTS    ? POLYPECTOMY    ? SAVORY DILATION N/A 12/12/2020  ? Procedure: SAVORY DILATION;  Surgeon: Irving Copas., MD;  Location: [facility];  Service: Gastroenterology;  Laterality: N/A;  ? SHOULDER SURGERY    ? SUPRAVENTRICULAR TACHYCARDIA ABLATION N/A 07/31/2013  ? Procedure: SUPRAVENTRICULAR TACHYCARDIA ABLATION;  Surgeon: Evans Lance, MD;  Location: [facility] CATH LAB;  Service: Cardiovascular;  Laterality: N/A;  ? ? ? ?FAMILY HISTORY:  ?Family History  ?Problem Relation Age of Onset  ? Colon cancer Father 46  ? Heart disease Mother   ? Breast cancer Maternal Aunt   ?     dx 10s  ? Lung cancer Maternal Aunt    ?     dx 12s, smoker  ? Ovarian cancer Cousin   ?     dx 52s, maternal first cousin  ? Lung cancer Maternal Grandfather   ?     dx 24s, smoker  ? Colon cancer Maternal Uncle   ?     dx 25s  ? Lung cancer Other   ?     MGF's brother  ? Colon cancer Nephew 62  ? Dementia Maternal Aunt   ? Breast cancer Cousin   ?     dx 14s, maternal first cousin  ? Ovarian cancer Cousin 51  ?     dysgerminoma, maternal first cousin  ? Lung cancer Other   ?     MGF's brother  ? Colon polyps Neg Hx   ? Esophageal cancer Neg Hx   ? Rectal cancer Neg Hx   ? Stomach cancer Neg Hx   ? ? ? ?SOCIAL HISTORY:  reports that she quit smoking about 43 years ago. Her smoking use included cigarettes. She has never used smokeless tobacco. She reports that she does not currently use alcohol. She reports that she does not use drugs. The patient is divorced and lives in [place]. She keeps her infant grandson during the workdays while her daughter teaches.  ? ? ?ALLERGIES: Patient has no known allergies. ? ? ?MEDICATIONS:  ?Current Outpatient Medications  ?Medication Sig Dispense Refill  ? cloNIDine (CATAPRES) 0.1 MG tablet Take 0.1 mg by mouth at bedtime.    ? [employer] SUNSCREEN SPF 30 EX Emerita progest cream once daily    ? cycloSPORINE (RESTASIS) 0.05 % ophthalmic emulsion Place 1 drop into both eyes 2 (two) times daily.    ? estradiol (CLIMARA - DOSED IN MG/24 HR) 0.025 mg/24hr patch Place 0.025 mg onto the skin once a week.     ? gabapentin (NEURONTIN) 100 MG capsule Take 300 mg by mouth 3 (three) times daily.    ? hydrOXYzine (ATARAX) 25 MG tablet 25 mg.    ? pantoprazole (PROTONIX) 40 MG tablet Take 1 tablet (40 mg total) by mouth daily. 30 tablet 1  ? Polyethyl Glycol-Propyl Glycol (SYSTANE OP) Place 2 drops into both eyes daily as needed (dry eyes).     ? zolpidem (AMBIEN CR) 12.5 MG CR tablet Take 12.5 mg by mouth at bedtime.    ? ibuprofen (ADVIL) 400 MG tablet Take 400 mg by mouth every 6 (six) hours as needed. (Patient not taking: Reported  on 07/13/2021)    ? Multiple Vitamin (MULTIVITAMIN WITH MINERALS) TABS tablet Take 1 tablet by mouth daily. (Patient not taking: Reported on 07/13/2021)    ? valACYclovir (VALTREX) 1000 MG tablet Take 0.5 tablets by mouth 2 (two) times daily as needed. Breakouts (Patient not taking:  Reported on 07/13/2021)  0  ? ?No current facility-administered medications for this encounter.  ? ?Facility-Administered Medications Ordered in Other Encounters  ?Medication Dose Route Frequency Provider Last Rate Last Admin  ? Chlorhexidine Gluconate Cloth 2 % PADS 6 each  6 each Topical Once Cornett, Marcello Moores, MD      ? And  ? Chlorhexidine Gluconate Cloth 2 % PADS 6 each  6 each Topical Once Cornett, Thomas, MD      ? ? ? ?REVIEW OF SYSTEMS: On review of systems, the patient reports that she is doing well overall. She is interested in considering her options at a later time to remove her in situ breast implants. No other breast specific complaints are noted.  ? ?  ? ?PHYSICAL EXAM:  ?Wt Readings from Last 3 Encounters:  ?07/13/21 115 lb 6 oz (52.3 kg)  ?12/12/20 116 lb (52.6 kg)  ?11/03/20 119 lb 0.8 oz (54 kg)  ? ?Temp Readings from Last 3 Encounters:  ?07/13/21 (!) 96.9 ?F (36.1 ?C) (Temporal)  ?12/12/20 98.3 ?F (36.8 ?C) (Oral)  ?11/03/20 97.7 ?F (36.5 ?C)  ? ?BP Readings from Last 3 Encounters:  ?07/13/21 111/75  ?12/12/20 118/66  ?11/03/20 128/77  ? ?Pulse Readings from Last 3 Encounters:  ?07/13/21 (!) 58  ?12/12/20 (!) 47  ?11/03/20 69  ? ? ?In general this is a well appearing caucasian female in no acute distress. She's alert and oriented x4 and appropriate throughout the examination. Cardiopulmonary assessment is negative for acute distress and she exhibits normal effort. Bilateral breast exam is deferred. ? ? ? ?ECOG = 1 ? ?0 - Asymptomatic (Fully active, able to carry on all predisease activities without restriction) ? ?1 - Symptomatic but completely ambulatory (Restricted in physically strenuous activity but ambulatory and  able to carry out work of a light or sedentary nature. For example, light housework, office work) ? ?2 - Symptomatic, <50% in bed during the day (Ambulatory and capable of all self care but unable to carry out any w

## 2021-07-13 NOTE — Progress Notes (Signed)
[place] ?CONSULT NOTE ? ?Patient Care Team: ?Josetta Huddle, MD as PCP - General (Internal Medicine) ?Mauro Kaufmann, RN as Oncology Nurse Navigator ?Rockwell Germany, RN as Oncology Nurse Navigator ? ?CHIEF COMPLAINTS/PURPOSE OF CONSULTATION:  ?Newly diagnosed breast cancer ? ?HISTORY OF PRESENTING ILLNESS:  ?[name] 71 y.o. female is here because of recent diagnosis of right  breast cancer. She presents to the clinic today for a consult.  She felt a palpable nodule in the right breast which led to mammogram ultrasound and biopsy which came back as DCIS.  She underwent right lumpectomy on 07/19/2021 with Dr. Brantley Stage and she is here today to discuss adjuvant treatment plan. ? ?I reviewed her records extensively and collaborated the history with the patient. ? ?SUMMARY OF ONCOLOGIC HISTORY: ?Oncology History  ?Ductal carcinoma in situ (DCIS) of right breast  ?06/20/2021 Initial Diagnosis  ? Screening detected Right Breast indeterminate mass at 9 o'clock position: 1.1 cm biopsy revealed intermediate grade DCIS ER 100%, PR 100% ?  ?07/19/2021 Surgery  ? Right Lumpectomy: HG DCIS with cribriform pattern and comedonecrosis 1.2 cm, Margins Negative, ER 100%, PR 100% ?  ? ? ? ?MEDICAL HISTORY:  ?Past Medical History:  ?Diagnosis Date  ? Anxiety   ? Arthritis   ? wrist   ? Atrial flutter with rapid ventricular response ([place])   ? SVT per pt   ? Breast cancer ([place]) 05/2021  ? right breast DCIS  ? Bunion   ? Cataract   ? mild   ? Colon polyps   ? Dysrhythmia 07/2013  ? SVT  ? Enthesopathy of ankle and tarsus, unspecified   ? Esophageal reflux   ? Family history of breast cancer   ? Family history of lung cancer   ? Family history of malignant neoplasm of gastrointestinal tract   ? Family history of ovarian cancer   ? Hiatal hernia   ? Irritable bowel syndrome   ? Monoallelic mutation of DZHG9J gene   ? 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[place])   ? Other acquired calcaneus deformity   ? Personal history of colonic polyps 2002   ? hyperplastic  ? Personal history of malignant melanoma   ? Personal history of squamous cell carcinoma of skin   ? Plantar fascial fibromatosis   ? Rotator cuff syndrome of shoulder and allied disorders   ? Skin cancer   ? Syncope   ? ? ?SURGICAL HISTORY: ?Past Surgical History:  ?Procedure Laterality Date  ? ABLATION OF DYSRHYTHMIC FOCUS  07/31/2013  ? DR Lovena Le  ? ARTHROPLASTY  2006  ? thumb/wrist   ? AUGMENTATION MAMMAPLASTY Bilateral   ? BRAVO PH STUDY  02/01/2011  ? Procedure: BRAVO [place] STUDY;  Surgeon: Inda Castle, MD;  [facility];  Service: Endoscopy;  Laterality: N/A;  ? BREAST BIOPSY Left 09/2020  ? BREAST BIOPSY Right 06/20/2021  ? BREAST EXCISIONAL BIOPSY Left 10/2020  ? BREAST LUMPECTOMY WITH RADIOACTIVE SEED LOCALIZATION Left 11/03/2020  ? Procedure: LEFT BREAST LUMPECTOMY WITH RADIOACTIVE SEED LOCALIZATION;  Surgeon: Erroll Luna, MD;  Location: [place];  Service: General;  Laterality: Left;  ? BREAST LUMPECTOMY WITH RADIOACTIVE SEED LOCALIZATION Right 07/19/2021  ? Procedure: RIGHT BREAST LUMPECTOMY WITH RADIOACTIVE SEED LOCALIZATION;  Surgeon: Erroll Luna, MD;  Location: [facility];  Service: General;  Laterality: Right;  ? BUNIONECTOMY    ? CESAREAN SECTION    ? x3  ? COLONOSCOPY  07/16/2012  ? ESOPHAGOGASTRODUODENOSCOPY  02/01/2011  ?  ESOPHAGOGASTRODUODENOSCOPY (EGD) WITH PROPOFOL N/A 12/12/2020  ? Procedure: ESOPHAGOGASTRODUODENOSCOPY (EGD) WITH PROPOFOL;  Surgeon: Rush Landmark Telford Nab., MD;  Location: [facility];  Service: Gastroenterology;  Laterality: N/A;  ? EUS N/A 12/12/2020  ? Procedure: UPPER ENDOSCOPIC ULTRASOUND (EUS) RADIAL;  Surgeon: Rush Landmark Telford Nab., MD;  [facility];  Service: Gastroenterology;  Laterality: N/A;  ? PARTIAL HYSTERECTOMY    ? Still has ovaries  ? PLACEMENT OF BREAST IMPLANTS    ? POLYPECTOMY    ? SAVORY DILATION N/A 12/12/2020  ? Procedure: SAVORY DILATION;  Surgeon: Irving Copas.,  MD;  Location: [facility];  Service: Gastroenterology;  Laterality: N/A;  ? SHOULDER SURGERY    ? SUPRAVENTRICULAR TACHYCARDIA ABLATION N/A 07/31/2013  ? Procedure: SUPRAVENTRICULAR TACHYCARDIA ABLATION;  Surgeon: Evans Lance, MD;  Location: [facility] CATH LAB;  Service: Cardiovascular;  Laterality: N/A;  ? ? ?SOCIAL HISTORY: ?Social History  ? ?Socioeconomic History  ? Marital status: Divorced  ?  Spouse name: Not on file  ? Number of children: 3  ? Years of education: Not on file  ? Highest education level: Not on file  ?Occupational History  ? Occupation: Careers information officer  ?  Employer: [employer]  ?Tobacco Use  ? Smoking status: Former  ?  Types: Cigarettes  ?  Quit date: 03/19/1978  ?  Years since quitting: 43.3  ? Smokeless tobacco: Never  ?Vaping Use  ? Vaping Use: Never used  ?Substance and Sexual Activity  ? Alcohol use: Not Currently  ?  Comment: rarely- twice a yr if that   ? Drug use: No  ? Sexual activity: Not Currently  ?  Birth control/protection: Surgical  ?  Comment: partial hyst  ?Other Topics Concern  ? Not on file  ?Social History Narrative  ? Not on file  ? ?Social Determinants of Health  ? ?Financial Resource Strain: Not on file  ?Food Insecurity: Not on file  ?Transportation Needs: Not on file  ?Physical Activity: Not on file  ?Stress: Not on file  ?Social Connections: Not on file  ?Intimate Partner Violence: Not on file  ? ? ?FAMILY HISTORY: ?Family History  ?Problem Relation Age of Onset  ? Colon cancer Father 33  ? Heart disease Mother   ? Breast cancer Maternal Aunt   ?     dx 49s  ? Lung cancer Maternal Aunt   ?     dx 13s, smoker  ? Ovarian cancer Cousin   ?     dx 10s, maternal first cousin  ? Lung cancer Maternal Grandfather   ?     dx 82s, smoker  ? Colon cancer Maternal Uncle   ?     dx 57s  ? Lung cancer Other   ?     MGF's brother  ? Colon cancer Nephew 52  ? Dementia Maternal Aunt   ? Breast cancer Cousin   ?     dx 73s, maternal first cousin  ? Ovarian cancer Cousin  9  ?     dysgerminoma, maternal first cousin  ? Lung cancer Other   ?     MGF's brother  ? Colon polyps Neg Hx   ? Esophageal cancer Neg Hx   ? Rectal cancer Neg Hx   ? Stomach cancer Neg Hx   ? ? ?ALLERGIES:  has No Known Allergies. ? ?MEDICATIONS:  ?Current Outpatient Medications  ?Medication Sig Dispense Refill  ? cloNIDine (CATAPRES) 0.1 MG tablet Take 0.1 mg by mouth at  bedtime.    ? [employer]S SUNSCREEN SPF 30 EX Emerita progest cream once daily    ? cycloSPORINE (RESTASIS) 0.05 % ophthalmic emulsion Place 1 drop into both eyes 2 (two) times daily.    ? estradiol (CLIMARA - DOSED IN MG/24 HR) 0.025 mg/24hr patch Place 0.025 mg onto the skin once a week.     ? gabapentin (NEURONTIN) 100 MG capsule Take 300 mg by mouth 3 (three) times daily.    ? hydrOXYzine (ATARAX) 25 MG tablet 25 mg.    ? ibuprofen (ADVIL) 400 MG tablet Take 400 mg by mouth every 6 (six) hours as needed.    ? ibuprofen (ADVIL) 800 MG tablet Take 1 tablet (800 mg total) by mouth every 8 (eight) hours as needed. 30 tablet 0  ? Multiple Vitamin (MULTIVITAMIN WITH MINERALS) TABS tablet Take 1 tablet by mouth daily.    ? pantoprazole (PROTONIX) 40 MG tablet Take 1 tablet (40 mg total) by mouth daily. 30 tablet 1  ? Polyethyl Glycol-Propyl Glycol (SYSTANE OP) Place 2 drops into both eyes daily as needed (dry eyes).     ? valACYclovir (VALTREX) 1000 MG tablet Take 0.5 tablets by mouth 2 (two) times daily as needed. Breakouts (Patient not taking: Reported on 07/13/2021)  0  ? zolpidem (AMBIEN CR) 12.5 MG CR tablet Take 12.5 mg by mouth at bedtime.    ? ?No current facility-administered medications for this visit.  ? ? ?REVIEW OF SYSTEMS:   ?Constitutional: Denies fevers, chills or abnormal night sweats ?  ?All other systems were reviewed with the patient and are negative. ? ?PHYSICAL EXAMINATION: ?ECOG PERFORMANCE STATUS: 1 - Symptomatic but completely ambulatory ? ?Vitals:  ? 07/27/21 1525  ?BP: 119/71  ?Pulse: 74  ?Resp: 18  ?Temp: 97.7 ?F (36.5 ?C)   ?SpO2: 98%  ? ?Filed Weights  ? 07/27/21 1525  ?Weight: 115 lb 6.4 oz (52.3 kg)  ?  ? ?LABORATORY DATA:  ?I have reviewed the data as listed ?Lab Results  ?Component Value Date  ? WBC 5.3 11/05/2016  ? HG

## 2021-07-17 HISTORY — PX: BREAST LUMPECTOMY: SHX2

## 2021-07-18 ENCOUNTER — Ambulatory Visit
Admission: RE | Admit: 2021-07-18 | Discharge: 2021-07-18 | Disposition: A | Discharge status: Home / Self Care | Payer: Medicare Other | Source: Ambulatory Visit | Attending: Surgery | Admitting: Surgery

## 2021-07-18 DIAGNOSIS — D0511 Intraductal carcinoma in situ of right breast: Secondary | ICD-10-CM

## 2021-07-19 ENCOUNTER — Ambulatory Visit (HOSPITAL_BASED_OUTPATIENT_CLINIC_OR_DEPARTMENT_OTHER)
Admission: RE | Admit: 2021-07-19 | Discharge: 2021-07-19 | Disposition: A | Discharge status: Home / Self Care | Payer: Medicare Other | Source: Ambulatory Visit | Attending: Surgery | Admitting: Surgery

## 2021-07-19 ENCOUNTER — Other Ambulatory Visit: Payer: Self-pay

## 2021-07-19 ENCOUNTER — Ambulatory Visit (HOSPITAL_BASED_OUTPATIENT_CLINIC_OR_DEPARTMENT_OTHER): Payer: Medicare Other | Admitting: Anesthesiology

## 2021-07-19 ENCOUNTER — Ambulatory Visit
Admission: RE | Admit: 2021-07-19 | Discharge: 2021-07-19 | Disposition: A | Discharge status: Home / Self Care | Payer: Medicare Other | Source: Ambulatory Visit | Attending: Surgery | Admitting: Surgery

## 2021-07-19 ENCOUNTER — Encounter (HOSPITAL_BASED_OUTPATIENT_CLINIC_OR_DEPARTMENT_OTHER)
Admission: RE | Disposition: A | Discharge status: Home / Self Care | Payer: Self-pay | Source: Ambulatory Visit | Attending: Surgery

## 2021-07-19 ENCOUNTER — Encounter (HOSPITAL_BASED_OUTPATIENT_CLINIC_OR_DEPARTMENT_OTHER): Payer: Self-pay | Admitting: Surgery

## 2021-07-19 DIAGNOSIS — Z87891 Personal history of nicotine dependence: Secondary | ICD-10-CM | POA: Insufficient documentation

## 2021-07-19 DIAGNOSIS — D0511 Intraductal carcinoma in situ of right breast: Secondary | ICD-10-CM

## 2021-07-19 DIAGNOSIS — Z9882 Breast implant status: Secondary | ICD-10-CM | POA: Diagnosis not present

## 2021-07-19 DIAGNOSIS — K219 Gastro-esophageal reflux disease without esophagitis: Secondary | ICD-10-CM | POA: Insufficient documentation

## 2021-07-19 HISTORY — PX: BREAST LUMPECTOMY WITH RADIOACTIVE SEED LOCALIZATION: SHX6424

## 2021-07-19 SURGERY — BREAST LUMPECTOMY WITH RADIOACTIVE SEED LOCALIZATION
Anesthesia: General | Site: Breast | Laterality: Right

## 2021-07-19 MED ORDER — ONDANSETRON HCL 4 MG/2ML IJ SOLN
INTRAMUSCULAR | Status: DC | PRN
  Administered 2021-07-19: 4 mg via INTRAVENOUS

## 2021-07-19 MED ORDER — DEXAMETHASONE SODIUM PHOSPHATE 4 MG/ML IJ SOLN
INTRAMUSCULAR | Status: DC | PRN
  Administered 2021-07-19: 5 mg via INTRAVENOUS

## 2021-07-19 MED ORDER — CEFAZOLIN SODIUM-DEXTROSE 2-4 GM/100ML-% IV SOLN
2.0000 g | INTRAVENOUS | Status: AC
  Administered 2021-07-19: 2 g via INTRAVENOUS

## 2021-07-19 MED ORDER — LIDOCAINE 2% (20 MG/ML) 5 ML SYRINGE
INTRAMUSCULAR | Status: AC
  Filled 2021-07-19: qty 5

## 2021-07-19 MED ORDER — OXYCODONE HCL 5 MG PO TABS: 5.0000 mg | ORAL_TABLET | Freq: Once | ORAL | Status: DC | PRN

## 2021-07-19 MED ORDER — MIDAZOLAM HCL 2 MG/2ML IJ SOLN
INTRAMUSCULAR | Status: AC
  Filled 2021-07-19: qty 2

## 2021-07-19 MED ORDER — EPHEDRINE SULFATE (PRESSORS) 50 MG/ML IJ SOLN
INTRAMUSCULAR | Status: DC | PRN
  Administered 2021-07-19 (×2): 10 mg via INTRAVENOUS

## 2021-07-19 MED ORDER — DEXAMETHASONE SODIUM PHOSPHATE 10 MG/ML IJ SOLN
INTRAMUSCULAR | Status: AC
  Filled 2021-07-19: qty 1

## 2021-07-19 MED ORDER — OXYCODONE HCL 5 MG/5ML PO SOLN: 5.0000 mg | Freq: Once | ORAL | Status: DC | PRN

## 2021-07-19 MED ORDER — CEFAZOLIN SODIUM-DEXTROSE 2-4 GM/100ML-% IV SOLN
INTRAVENOUS | Status: AC
  Filled 2021-07-19: qty 100

## 2021-07-19 MED ORDER — FENTANYL CITRATE (PF) 100 MCG/2ML IJ SOLN
25.0000 ug | INTRAMUSCULAR | Status: DC | PRN
  Administered 2021-07-19: 25 ug via INTRAVENOUS
  Administered 2021-07-19: 50 ug via INTRAVENOUS
  Administered 2021-07-19: 25 ug via INTRAVENOUS

## 2021-07-19 MED ORDER — SODIUM CHLORIDE 0.9 % IV SOLN
INTRAVENOUS | Status: DC | PRN
  Administered 2021-07-19: 500 mL

## 2021-07-19 MED ORDER — ACETAMINOPHEN 500 MG PO TABS
ORAL_TABLET | ORAL | Status: AC
  Filled 2021-07-19: qty 2

## 2021-07-19 MED ORDER — AMISULPRIDE (ANTIEMETIC) 5 MG/2ML IV SOLN: 10.0000 mg | Freq: Once | INTRAVENOUS | Status: DC | PRN

## 2021-07-19 MED ORDER — ONDANSETRON HCL 4 MG/2ML IJ SOLN
INTRAMUSCULAR | Status: AC
  Filled 2021-07-19: qty 2

## 2021-07-19 MED ORDER — SODIUM CHLORIDE 0.9 % IV SOLN
INTRAVENOUS | Status: AC
  Filled 2021-07-19: qty 10

## 2021-07-19 MED ORDER — FENTANYL CITRATE (PF) 100 MCG/2ML IJ SOLN
INTRAMUSCULAR | Status: DC | PRN
  Administered 2021-07-19 (×2): 50 ug via INTRAVENOUS

## 2021-07-19 MED ORDER — FENTANYL CITRATE (PF) 100 MCG/2ML IJ SOLN
INTRAMUSCULAR | Status: AC
  Filled 2021-07-19: qty 2

## 2021-07-19 MED ORDER — LIDOCAINE HCL (CARDIAC) PF 100 MG/5ML IV SOSY
PREFILLED_SYRINGE | INTRAVENOUS | Status: DC | PRN
  Administered 2021-07-19: 60 mg via INTRAVENOUS

## 2021-07-19 MED ORDER — LACTATED RINGERS IV SOLN: INTRAVENOUS | Status: DC

## 2021-07-19 MED ORDER — IBUPROFEN 800 MG PO TABS: 800.0000 mg | ORAL_TABLET | Freq: Three times a day (TID) | ORAL | 0 refills | Status: DC | PRN

## 2021-07-19 MED ORDER — ACETAMINOPHEN 500 MG PO TABS
1000.0000 mg | ORAL_TABLET | ORAL | Status: AC
  Administered 2021-07-19: 1000 mg via ORAL

## 2021-07-19 MED ORDER — BUPIVACAINE-EPINEPHRINE (PF) 0.25% -1:200000 IJ SOLN
INTRAMUSCULAR | Status: DC | PRN
  Administered 2021-07-19: 10 mL

## 2021-07-19 MED ORDER — PROPOFOL 10 MG/ML IV BOLUS
INTRAVENOUS | Status: DC | PRN
  Administered 2021-07-19: 120 mg via INTRAVENOUS

## 2021-07-19 SURGICAL SUPPLY — 56 items
ADH SKN CLS APL DERMABOND .7 (GAUZE/BANDAGES/DRESSINGS) ×1
APL PRP STRL LF DISP 70% ISPRP (MISCELLANEOUS) ×1
APPLIER CLIP 9.375 MED OPEN (MISCELLANEOUS)
APR CLP MED 9.3 20 MLT OPN (MISCELLANEOUS)
BINDER BREAST LRG (GAUZE/BANDAGES/DRESSINGS) IMPLANT
BINDER BREAST MEDIUM (GAUZE/BANDAGES/DRESSINGS) ×1 IMPLANT
BINDER BREAST XLRG (GAUZE/BANDAGES/DRESSINGS) IMPLANT
BINDER BREAST XXLRG (GAUZE/BANDAGES/DRESSINGS) IMPLANT
BLADE SURG 15 STRL LF DISP TIS (BLADE) ×1 IMPLANT
BLADE SURG 15 STRL SS (BLADE) ×2
CANISTER SUC SOCK COL 7IN (MISCELLANEOUS) IMPLANT
CANISTER SUCT 1200ML W/VALVE (MISCELLANEOUS) ×1 IMPLANT
CHLORAPREP W/TINT 26 (MISCELLANEOUS) ×2 IMPLANT
CLIP APPLIE 9.375 MED OPEN (MISCELLANEOUS) IMPLANT
COVER BACK TABLE 60X90IN (DRAPES) ×2 IMPLANT
COVER MAYO STAND STRL (DRAPES) ×2 IMPLANT
COVER PROBE W GEL 5X96 (DRAPES) ×2 IMPLANT
DERMABOND ADVANCED (GAUZE/BANDAGES/DRESSINGS) ×1
DERMABOND ADVANCED .7 DNX12 (GAUZE/BANDAGES/DRESSINGS) ×1 IMPLANT
DRAPE LAPAROSCOPIC ABDOMINAL (DRAPES) IMPLANT
DRAPE LAPAROTOMY 100X72 PEDS (DRAPES) ×2 IMPLANT
DRAPE UTILITY XL STRL (DRAPES) ×2 IMPLANT
ELECT COATED BLADE 2.86 ST (ELECTRODE) ×2 IMPLANT
ELECT REM PT RETURN 9FT ADLT (ELECTROSURGICAL) ×2
ELECTRODE REM PT RTRN 9FT ADLT (ELECTROSURGICAL) ×1 IMPLANT
GLOVE BIOGEL PI IND STRL 7.0 (GLOVE) IMPLANT
GLOVE BIOGEL PI IND STRL 8 (GLOVE) ×1 IMPLANT
GLOVE BIOGEL PI INDICATOR 7.0 (GLOVE) ×2
GLOVE BIOGEL PI INDICATOR 8 (GLOVE) ×1
GLOVE ECLIPSE 8.0 STRL XLNG CF (GLOVE) ×2 IMPLANT
GLOVE SURG SYN 7.5  E (GLOVE) ×2
GLOVE SURG SYN 7.5 E (GLOVE) ×1 IMPLANT
GLOVE SURG SYN 7.5 PF PI (GLOVE) IMPLANT
GOWN STRL REUS W/ TWL LRG LVL3 (GOWN DISPOSABLE) ×2 IMPLANT
GOWN STRL REUS W/ TWL XL LVL3 (GOWN DISPOSABLE) ×1 IMPLANT
GOWN STRL REUS W/TWL LRG LVL3 (GOWN DISPOSABLE) ×2
GOWN STRL REUS W/TWL XL LVL3 (GOWN DISPOSABLE) ×2
HEMOSTAT ARISTA ABSORB 3G PWDR (HEMOSTASIS) IMPLANT
HEMOSTAT SNOW SURGICEL 2X4 (HEMOSTASIS) IMPLANT
KIT MARKER MARGIN INK (KITS) ×2 IMPLANT
NDL HYPO 25X1 1.5 SAFETY (NEEDLE) ×1 IMPLANT
NEEDLE HYPO 25X1 1.5 SAFETY (NEEDLE) ×2 IMPLANT
NS IRRIG 1000ML POUR BTL (IV SOLUTION) ×1 IMPLANT
PACK BASIN DAY SURGERY FS (CUSTOM PROCEDURE TRAY) ×2 IMPLANT
PENCIL SMOKE EVACUATOR (MISCELLANEOUS) ×2 IMPLANT
SLEEVE SCD COMPRESS KNEE MED (STOCKING) ×2 IMPLANT
SPIKE FLUID TRANSFER (MISCELLANEOUS) IMPLANT
SPONGE T-LAP 4X18 ~~LOC~~+RFID (SPONGE) ×2 IMPLANT
SUT MNCRL AB 4-0 PS2 18 (SUTURE) ×2 IMPLANT
SUT SILK 2 0 SH (SUTURE) IMPLANT
SUT VICRYL 3-0 CR8 SH (SUTURE) ×2 IMPLANT
SYR CONTROL 10ML LL (SYRINGE) ×2 IMPLANT
TOWEL GREEN STERILE FF (TOWEL DISPOSABLE) ×2 IMPLANT
TRAY FAXITRON CT DISP (TRAY / TRAY PROCEDURE) ×2 IMPLANT
TUBE CONNECTING 20X1/4 (TUBING) ×1 IMPLANT
YANKAUER SUCT BULB TIP NO VENT (SUCTIONS) ×1 IMPLANT

## 2021-07-19 NOTE — Op Note (Signed)
Preoperative diagnosis: Right breast DCIS upper outer quadrant ? ?Postoperative diagnosis: Same ? ?Procedure: Right breast seed localized lumpectomy ? ?Surgeon: Erroll Luna, MD ? ?Anesthesia: LMA with 0.25% Marcaine plain ? ?EBL: Minimal ? ?Specimen: Right breast tissue with seed and clip verified by Faxitron.  Of note her posterior margin is the pectoralis muscle and the anterior margin skin.  Additional margins were taken otherwise.  All tissue was oriented with ink.  Seed and clip verification performed with the Faxitron. ? ?Drains: None ? ?IV fluids: Per anesthesia record ? ?Indications for procedure: The patient is a 71 year old female with right breast DCIS.  She chose breast conserving surgery.  She is here today for completion of right breast lumpectomy seed localization.The procedure has been discussed with the patient. Alternatives to surgery have been discussed with the patient.  Risks of surgery include bleeding,  Infection,  cosmesis,  Seroma formation, death,  and the need for further surgery.   The patient understands and wishes to proceed.  ? ?Description of procedure: The patient was met in the holding area and questions were answered.  She underwent seed localization as an outpatient.  The right breast was marked as correct site and all questions were answered.  She was taken back to the operating room.  She is placed upon upon the OR table.  After induction of general anesthesia, right breast was prepped and draped in sterile fashion and timeout performed.  Proper patient, site and procedure verified and she received appropriate preoperative antibiotics.  The neoprobe was used to identify the seed in the right breast upper outer quadrant.  Incision was made in the right axilla.  Dissection was carried up toward the seed location in the right breast upper outer quadrant.  This was quite superficial.  I mobilized all breast tissue around it.  I got up under the skin and then down to the  pectoralis muscle.  Of note she had an implant and this was protected during the case.  All tissue around the seed and clip were excised.  Margins appear grossly negative.  I opted to shave the lumpectomy cavity except for the anterior and posterior margins since this was skin and muscle respectively.  These were all excised sent to pathology after orientation.  Hemostasis was achieved.  The seed and clip were in the specimen that was verified by the Faxitron.  Incision closed after irrigation and ensuring hemostasis with 3-0 Vicryl and 4 Monocryl.  Dermabond applied.  All counts found to be correct.  Breast binder placed.  Patient was awoke extubated taken to recovery in satisfactory condition. ?

## 2021-07-19 NOTE — Anesthesia Postprocedure Evaluation (Signed)
Anesthesia Post Note ? ?Patient: [name] ? ?Procedure(s) Performed: RIGHT BREAST LUMPECTOMY WITH RADIOACTIVE SEED LOCALIZATION (Right: Breast) ? ?  ? ?Patient location during evaluation: PACU ?Anesthesia Type: General ?Level of consciousness: awake and alert ?Pain management: pain level controlled ?Vital Signs Assessment: post-procedure vital signs reviewed and stable ?Respiratory status: spontaneous breathing, nonlabored ventilation, respiratory function stable and patient connected to nasal cannula oxygen ?Cardiovascular status: blood pressure returned to baseline and stable ?Postop Assessment: no apparent nausea or vomiting ?Anesthetic complications: no ? ? ?No notable events documented. ? ?Last Vitals:  ?Vitals:  ? 07/19/21 1200 07/19/21 1232  ?BP: 112/66 110/65  ?Pulse: (!) 56 (!) 50  ?Resp: 17 16  ?Temp:  36.4 ?C  ?SpO2: 95% 97%  ?  ?Last Pain:  ?Vitals:  ? 07/19/21 1232  ?TempSrc:   ?PainSc: 1   ? ? ?  ?  ?  ?  ?  ?  ? ?Suzette Battiest E ? ? ? ? ?

## 2021-07-19 NOTE — Progress Notes (Signed)
Patient states she had two cups of black coffee at 0530 and drank Ensure drink provided at 0630. Dr. Gifford Shave aware, okay to proceed with surgery as scheduled per Dr. Gifford Shave.  ?

## 2021-07-19 NOTE — Anesthesia Procedure Notes (Signed)
Procedure Name: LMA Insertion ?Date/Time: 07/19/2021 10:49 AM ?Performed by: Maryella Shivers, CRNA ?Pre-anesthesia Checklist: Patient identified, Emergency Drugs available, Suction available and Patient being monitored ?Patient Re-evaluated:Patient Re-evaluated prior to induction ?Oxygen Delivery Method: Circle system utilized ?Preoxygenation: Pre-oxygenation with 100% oxygen ?Induction Type: IV induction ?Ventilation: Mask ventilation without difficulty ?LMA: LMA inserted ?LMA Size: 4.0 ?Number of attempts: 1 ?Airway Equipment and Method: Bite block ?Placement Confirmation: positive ETCO2 ?Tube secured with: Tape ?Dental Injury: Teeth and Oropharynx as per pre-operative assessment  ? ? ? ? ?

## 2021-07-19 NOTE — Discharge Instructions (Addendum)
?Post Anesthesia Home Care Instructions ? ?Activity: ?Get plenty of rest for the remainder of the day. A responsible individual must stay with you for 24 hours following the procedure.  ?For the next 24 hours, DO NOT: ?-Drive a car ?-Paediatric nurse ?-Drink alcoholic beverages ?-Take any medication unless instructed by your physician ?-Make any legal decisions or sign important papers. ? ?Meals: ?Start with liquid foods such as gelatin or soup. Progress to regular foods as tolerated. Avoid greasy, spicy, heavy foods. If nausea and/or vomiting occur, drink only clear liquids until the nausea and/or vomiting subsides. Call your physician if vomiting continues. ? ?Special Instructions/Symptoms: ?Your throat may feel dry or sore from the anesthesia or the breathing tube placed in your throat during surgery. If this causes discomfort, gargle with warm salt water. The discomfort should disappear within 24 hours. ? ?If you had a scopolamine patch placed behind your ear for the management of post- operative nausea and/or vomiting: ? ?1. The medication in the patch is effective for 72 hours, after which it should be removed.  Wrap patch in a tissue and discard in the trash. Wash hands thoroughly with soap and water. ?2. You may remove the patch earlier than 72 hours if you experience unpleasant side effects which may include dry mouth, dizziness or visual disturbances. ?3. Avoid touching the patch. Wash your hands with soap and water after contact with the patch. ?   ?[place] ?Office Phone Number [phone] ? ?BREAST BIOPSY/ PARTIAL MASTECTOMY: POST OP INSTRUCTIONS ? ?Always review your discharge instruction sheet given to you by the facility where your surgery was performed. ? ?IF YOU HAVE DISABILITY OR FAMILY LEAVE FORMS, YOU MUST BRING THEM TO THE OFFICE FOR PROCESSING.  DO NOT GIVE THEM TO YOUR DOCTOR. ? ?A prescription for pain medication may be given to you upon discharge.  Take your pain  medication as prescribed, if needed.  If narcotic pain medicine is not needed, then you may take acetaminophen (Tylenol) or ibuprofen (Advil) as needed. ?Take your usually prescribed medications unless otherwise directed ?If you need a refill on your pain medication, please contact your pharmacy.  They will contact our office to request authorization.  Prescriptions will not be filled after 5pm or on week-ends. ?You should eat very light the first 24 hours after surgery, such as soup, crackers, pudding, etc.  Resume your normal diet the day after surgery. ?Most patients will experience some swelling and bruising in the breast.  Ice packs and a good support bra will help.  Swelling and bruising can take several days to resolve.  ?It is common to experience some constipation if taking pain medication after surgery.  Increasing fluid intake and taking a stool softener will usually help or prevent this problem from occurring.  A mild laxative (Milk of Magnesia or Miralax) should be taken according to package directions if there are no bowel movements after 48 hours. ?Unless discharge instructions indicate otherwise, you may remove your bandages 24-48 hours after surgery, and you may shower at that time.  You may have steri-strips (small skin tapes) in place directly over the incision.  These strips should be left on the skin for 7-10 days.  If your surgeon used skin glue on the incision, you may shower in 24 hours.  The glue will flake off over the next 2-3 weeks.  Any sutures or staples will be removed at the office during your follow-up visit. ?ACTIVITIES:  You may resume regular daily activities (gradually increasing)  beginning the next day.  Wearing a good support bra or sports bra minimizes pain and swelling.  You may have sexual intercourse when it is comfortable. ?You may drive when you no longer are taking prescription pain medication, you can comfortably wear a seatbelt, and you can safely maneuver your car and  apply brakes. ?RETURN TO WORK:  ______________________________________________________________________________________ ?You should see your doctor in the office for a follow-up appointment approximately two weeks after your surgery.  Your doctor?s nurse will typically make your follow-up appointment when she calls you with your pathology report.  Expect your pathology report 2-3 business days after your surgery.  You may call to check if you do not hear from [place] after three days. ?OTHER INSTRUCTIONS: _______________________________________________________________________________________________ _____________________________________________________________________________________________________________________________________ ?_____________________________________________________________________________________________________________________________________ ?_____________________________________________________________________________________________________________________________________ ? ?WHEN TO CALL YOUR DOCTOR: ?Fever over 101.0 ?Nausea and/or vomiting. ?Extreme swelling or bruising. ?Continued bleeding from incision. ?Increased pain, redness, or drainage from the incision. ? ?The clinic staff is available to answer your questions during regular business hours.  Please don?t hesitate to call and ask to speak to one of the nurses for clinical concerns.  If you have a medical emergency, go to the nearest emergency room or call 911.  A surgeon from [facility] is always on call at the hospital. ? ?For further questions, please visit [email or web address]   ?

## 2021-07-19 NOTE — Anesthesia Preprocedure Evaluation (Signed)
Anesthesia Evaluation  ?Patient identified by MRN, date of birth, ID band ?Patient awake ? ? ? ?Reviewed: ?Allergy & Precautions, NPO status , Patient's Chart, lab work & pertinent test results ? ?Airway ?Mallampati: II ? ?TM Distance: >3 FB ?Neck ROM: Full ? ? ? Dental ? ?(+) Dental Advisory Given ?  ?Pulmonary ?former smoker,  ?  ?breath sounds clear to auscultation ? ? ? ? ? ? Cardiovascular ?+ dysrhythmias  ?Rhythm:Regular Rate:Normal ? ? ?  ?Neuro/Psych ?negative neurological ROS ?   ? GI/Hepatic ?Neg liver ROS, hiatal hernia, GERD  ,  ?Endo/Other  ?negative endocrine ROS ? Renal/GU ?negative Renal ROS  ? ?  ?Musculoskeletal ? ? Abdominal ?  ?Peds ? Hematology ?negative hematology ROS ?(+)   ?Anesthesia Other Findings ? ? Reproductive/Obstetrics ? ?  ? ? ? ? ? ? ? ? ? ? ? ? ? ?  ?  ? ? ? ? ? ? ? ? ?Anesthesia Physical ?Anesthesia Plan ? ?ASA: 2 ? ?Anesthesia Plan: General  ? ?Post-op Pain Management: Tylenol PO (pre-op)* and Toradol IV (intra-op)*  ? ?Induction: Intravenous ? ?PONV Risk Score and Plan: 3 and Dexamethasone, Ondansetron and Treatment may vary due to age or medical condition ? ?Airway Management Planned: LMA ? ?Additional Equipment:  ? ?Intra-op Plan:  ? ?Post-operative Plan: Extubation in OR ? ?Informed Consent: I have reviewed the patients History and Physical, chart, labs and discussed the procedure including the risks, benefits and alternatives for the proposed anesthesia with the patient or authorized representative who has indicated his/her understanding and acceptance.  ? ? ? ?Dental advisory given ? ?Plan Discussed with:  ? ?Anesthesia Plan Comments:   ? ? ? ? ? ? ?Anesthesia Quick Evaluation ? ?

## 2021-07-19 NOTE — Interval H&P Note (Signed)
History and Physical Interval Note: ? ?07/19/2021 ?10:21 AM ? ?[name]  has presented today for surgery, with the diagnosis of RIGHT BREAST DCIS.  The various methods of treatment have been discussed with the patient and family. After consideration of risks, benefits and other options for treatment, the patient has consented to  Procedure(s): ?RIGHT BREAST LUMPECTOMY WITH RADIOACTIVE SEED LOCALIZATION (Right) as a surgical intervention.  The patient's history has been reviewed, patient examined, no change in status, stable for surgery.  I have reviewed the patient's chart and labs.  Questions were answered to the patient's satisfaction.   ? ? ?Orlin Kann A Laniqua Torrens ? ? ?

## 2021-07-19 NOTE — H&P (Signed)
Chief Complaint: Breast Problem ? ? ?History of Present Illness: ?[name] is a 71 y.o. female who is seen today as an office consultation for evaluation of Breast Problem ?.  ? ?Patient presents for evaluation of the palpable abnormality right breast. The patient had an aspiration of a cyst back in February of this year at the 2 o'clock position. This was in the left breast there. She had a biopsy as well in the left breast in February 2023 as showed fibrocystic change. Of note she had a lumpectomy on the left which showed atypical lobular hyperplasia in 2022. She was now found to have a mass in the right breast measuring about 1.1 cm at about the 9 o'clock position. Core biopsy showed DCIS intermediate grade. The patient denied any history beyond what was stated above. She is not currently on tamoxifen. ? ?Review of Systems: ?A complete review of systems was obtained from the patient. I have reviewed this information and discussed as appropriate with the patient. See HPI as well for other ROS. ? ? ? ?Medical History: ?Past Medical History:  ?Diagnosis Date  ? Anxiety  ? Arthritis  ? History of cancer  ? ?There is no problem list on file for this patient. ? ?Past Surgical History:  ?Procedure Laterality Date  ? breast implants surgery N/A  ? c-section surgery N/A  ? HYSTERECTOMY  ? WRIST ARTHROPLASTY N/A  ? ? ?No Known Allergies ? ?Current Outpatient Medications on File Prior to Visit  ?Medication Sig Dispense Refill  ? cloNIDine HCL (CATAPRES) 0.1 MG tablet TAKE 1 OR 2 TABLETS BY MOUTH AT BEDTIME  ? estradiol (VIVELLE-DOT) patch 0.025 mg/24 hr Place 1 patch onto the skin twice a week  ? pantoprazole (PROTONIX) 40 MG DR tablet Take 40 mg by mouth once daily  ? RESTASIS 0.05 % ophthalmic emulsion INSERT 1 DROP INTO BOTH EYES TWICE DAILY AS DIRECTED.  ? zaleplon (SONATA) 10 MG capsule take 1-2 capsules by mouth at bedtime  ? zolpidem (AMBIEN CR) 12.5 MG CR tablet Take 12.5 mg by mouth nightly  ? ?No current  facility-administered medications on file prior to visit.  ? ?Family History  ?Problem Relation Age of Onset  ? High blood pressure (Hypertension) Father  ? Colon cancer Father  ? ? ?Social History  ? ?Tobacco Use  ?Smoking Status Former  ?Smokeless Tobacco Never  ? ? ?Social History  ? ?Socioeconomic History  ? Marital status: Divorced  ?Tobacco Use  ? Smoking status: Former  ? Smokeless tobacco: Never  ?Substance and Sexual Activity  ? Alcohol use: Not Currently  ? Drug use: Never  ? ?Objective:  ? ?Vitals:  ?07/10/21 0917  ?BP: 118/86  ?Pulse: 65  ?Temp: 36.6 ?C (97.9 ?F)  ?SpO2: 97%  ?Weight: 52.7 kg (116 lb 3.2 oz)  ?Height: 162.6 cm ('5\' 4"'$ )  ? ?Body mass index is 19.95 kg/m?. ? ?Physical Exam ?HENT:  ?Head: Normocephalic.  ?Eyes:  ?Pupils: Pupils are equal, round, and reactive to light.  ?Cardiovascular:  ?Rate and Rhythm: Normal rate.  ?Pulmonary:  ?Effort: Pulmonary effort is normal.  ?Breath sounds: No stridor.  ?Chest:  ?Comments: Bilateral breast implants in place. Scar left breast noted from previous surgery. Some bruising and fullness noted right breast at about 9:00. No nipple discharge noted. Scars from breast augmentation noted. ?Musculoskeletal:  ?General: Normal range of motion.  ?Cervical back: Normal range of motion.  ?Skin: ?General: Skin is warm.  ?Neurological:  ?General: No focal deficit present.  ?  Mental Status: She is alert.  ?Psychiatric:  ?Mood and Affect: Mood normal.  ? ? ? ?Labs, Imaging and Diagnostic Testing: ?Diagnosis ?Breast, right, needle core biopsy, 9:00 ?- DUCTAL CARCINOMA IN SITU ?- SEE COMMENT ?Microscopic Comment ?Based on the biopsy, the ductal carcinoma in situ has a cribriform pattern, intermediate nuclear grade and measures 0.2 ?CLINICAL DATA: 71 year old female complaining of a palpable ?abnormality in the right breast. On May 15, 2021 the patient ?had an ultrasound-guided aspiration of a mass in the 2 o'clock ?region of the left breast 4 cm from the nipple, which  decompressed ?and was felt to likely be a cyst. She also had an ultrasound-guided ?core biopsy of a mass in the left breast at 4 o'clock 1 cm from the ?nipple which demonstrated fibrocystic change with usual ductal ?hyperplasia and pseudoangiomatous stromal hyperplasia. Short-term ?interval follow-up left breast ultrasound recommended. ?  ?EXAM: ?DIGITAL DIAGNOSTIC UNILATERAL RIGHT MAMMOGRAM WITH IMPLANTS, CAD AND ?TOMOSYNTHESIS; ULTRASOUND RIGHT BREAST LIMITED ?  ?TECHNIQUE: ?Right digital diagnostic mammography and breast tomosynthesis was ?performed. The images were evaluated with computer-aided detection. ?Standard and/or implant displaced views were performed.; Targeted ?ultrasound examination of the right breast was performed ?  ?COMPARISON: Previous exam(s). ?  ?ACR Breast Density Category b: There are scattered areas of ?fibroglandular density. ?  ?FINDINGS: ?The patient has a retropectoral implant. No suspicious mass or ?malignant type microcalcifications identified in the right breast. ?The patient has retropectoral implants. ?  ?On physical exam, I palpate a discrete mass in the right breast at 9 ?o'clock 6 cm from the nipple. ?  ?Targeted ultrasound is performed, showing a hypoechoic solid in the ?right breast at 9 o'clock 6 cm from the nipple measuring 1.1 x 0.4 x ?1.1 cm. Sonographic evaluation of the right axilla does not show any ?enlarged adenopathy. ?  ?IMPRESSION: ?Indeterminate mass in the 9 o'clock region of the right breast. ?  ?RECOMMENDATION: ?Ultrasound-guided biopsy of the mass in the 9 o'clock region of the ?right breast is recommended. ?  ?I have discussed the findings and recommendations with the patient. ?If applicable, a reminder letter will be sent to the patient ?regarding the next appointment. ?  ?BI-RADS CATEGORY 4: Suspicious. ?  ?  ?Electronically Signed ?By: Lillia Mountain M.D. ?On: 06/14/2021 16:06 ?  ?Assessment and Plan:  ? ?Diagnoses and all orders for this visit: ? ?Ductal  carcinoma in situ (DCIS) of right breast ? ? ? ?Discussed surgical options and the natural pathophysiology of DCIS. Discussed medical and surgical treatment options as well as breast conserving surgery versus mastectomy. We discussed removal of her implants. She would like to proceed with breast conserving surgery in the right consisting of right breast lumpectomy. I will make a referral to medical and radiation oncology. Local regional recurrence, long-term survival and outcomes of both breast conserving surgery mastectomy reconstruction discussed with the patient today.The procedure has been discussed with the patient. Alternatives to surgery have been discussed with the patient. Risks of surgery include bleeding, Infection, Seroma formation, death, and the need for further surgery. The patient understands and wishes to proceed. ? ?No follow-ups on file. ? ?Kennieth Francois, MD  ?

## 2021-07-19 NOTE — Transfer of Care (Signed)
Immediate Anesthesia Transfer of Care Note ? ?Patient: [name] ? ?Procedure(s) Performed: RIGHT BREAST LUMPECTOMY WITH RADIOACTIVE SEED LOCALIZATION (Right: Breast) ? ?Patient Location: PACU ? ?Anesthesia Type:General ? ?Level of Consciousness: awake, alert  and oriented ? ?Airway & Oxygen Therapy: Patient Spontanous Breathing ? ?Post-op Assessment: Report given to RN and Post -op Vital signs reviewed and stable ? ?Post vital signs: Reviewed and stable ? ?Last Vitals:  ?Vitals Value Taken Time  ?BP 111/73 07/19/21 1135  ?Temp    ?Pulse 69 07/19/21 1136  ?Resp 14 07/19/21 1136  ?SpO2 99 % 07/19/21 1136  ?Vitals shown include unvalidated device data. ? ?Last Pain:  ?Vitals:  ? 07/19/21 0905  ?TempSrc: Oral  ?PainSc: 0-No pain  ?   ? ?Patients Stated Pain Goal: 3 (07/19/21 0905) ? ?Complications: No notable events documented. ?

## 2021-07-20 ENCOUNTER — Encounter (HOSPITAL_COMMUNITY): Payer: Self-pay

## 2021-07-20 ENCOUNTER — Encounter (HOSPITAL_BASED_OUTPATIENT_CLINIC_OR_DEPARTMENT_OTHER): Payer: Self-pay | Admitting: Surgery

## 2021-07-24 ENCOUNTER — Encounter: Payer: Self-pay | Admitting: *Deleted

## 2021-07-25 ENCOUNTER — Encounter: Payer: Self-pay | Admitting: Surgery

## 2021-07-25 LAB — SURGICAL PATHOLOGY

## 2021-07-27 ENCOUNTER — Other Ambulatory Visit: Payer: Self-pay

## 2021-07-27 ENCOUNTER — Inpatient Hospital Stay: Payer: Medicare Other | Attending: Hematology and Oncology | Admitting: Hematology and Oncology

## 2021-07-27 ENCOUNTER — Encounter: Payer: Self-pay | Admitting: *Deleted

## 2021-07-27 DIAGNOSIS — D0511 Intraductal carcinoma in situ of right breast: Secondary | ICD-10-CM | POA: Insufficient documentation

## 2021-07-27 DIAGNOSIS — Z801 Family history of malignant neoplasm of trachea, bronchus and lung: Secondary | ICD-10-CM | POA: Insufficient documentation

## 2021-07-27 DIAGNOSIS — Z803 Family history of malignant neoplasm of breast: Secondary | ICD-10-CM | POA: Diagnosis not present

## 2021-07-27 DIAGNOSIS — Z87891 Personal history of nicotine dependence: Secondary | ICD-10-CM | POA: Diagnosis not present

## 2021-07-27 DIAGNOSIS — Z800 Family history of malignant neoplasm of digestive organs: Secondary | ICD-10-CM | POA: Diagnosis not present

## 2021-07-27 NOTE — Assessment & Plan Note (Signed)
07/19/2021: Screening mammogram detected right breast indeterminate mass at 9 o'clock position 1.1 cm on biopsy was intermediate grade DCIS ER 100%, PR 100%, status post lumpectomy high-grade DCIS with cribriform pattern and comedonecrosis 1.2 cm, margins negative ? ?Pathology counseling: I discussed the final pathology report of the patient provided  a copy of this report. I discussed the margins.  We also discussed the final staging along with previously performed ER/PR testing. ? ?Treatment plan: ?1.  Adjuvant radiation therapy ?2. adjuvant antiestrogen therapy with tamoxifen x5 years ? ?Return to clinic after radiation to start antiestrogen therapy. ?

## 2021-08-03 ENCOUNTER — Encounter: Payer: Self-pay | Admitting: *Deleted

## 2021-08-04 ENCOUNTER — Encounter: Payer: Self-pay | Admitting: Licensed Clinical Social Worker

## 2021-08-04 NOTE — Progress Notes (Signed)
[place] Work  Clinical Social Work was referred by  new pt protocol  for assessment of psychosocial needs.  Clinical Social Worker contacted patient by phone  to offer support and assess for needs.   Pt reports doing well so far with no resource needs. Pt was driving, so limited conversation today.  CSW informed patient of the support team and support services at [facility] and emailed monthly calendar to pt.  CSW provided contact information and encouraged patient to call with any questions or concerns.      [place], [place] Worker [employer]

## 2021-08-15 ENCOUNTER — Telehealth: Payer: Self-pay

## 2021-08-15 NOTE — Telephone Encounter (Addendum)
Appointment reminder. Verified patient's identity and reminded patient of her 2:30pm-08/16/21 in-person appointment w/ Shona Simpson PA-C. I advised patient to arrive 35mn early for check-in. I left my extension [phone] case patient needs anything. Patient verbalized understanding.

## 2021-08-16 ENCOUNTER — Other Ambulatory Visit: Payer: Self-pay

## 2021-08-16 ENCOUNTER — Encounter: Payer: Self-pay | Admitting: Radiation Oncology

## 2021-08-16 ENCOUNTER — Ambulatory Visit
Admission: RE | Admit: 2021-08-16 | Discharge: 2021-08-16 | Disposition: A | Discharge status: Home / Self Care | Payer: Medicare Other | Source: Ambulatory Visit | Attending: Radiation Oncology | Admitting: Radiation Oncology

## 2021-08-16 ENCOUNTER — Ambulatory Visit: Admission: RE | Admit: 2021-08-16 | Payer: Medicare Other | Source: Ambulatory Visit | Admitting: Radiation Oncology

## 2021-08-16 VITALS — BP 100/67 | HR 57 | Temp 97.5°F | Resp 18 | Ht 64.0 in | Wt 115.0 lb

## 2021-08-16 DIAGNOSIS — Z79899 Other long term (current) drug therapy: Secondary | ICD-10-CM | POA: Diagnosis not present

## 2021-08-16 DIAGNOSIS — Z8719 Personal history of other diseases of the digestive system: Secondary | ICD-10-CM | POA: Insufficient documentation

## 2021-08-16 DIAGNOSIS — M129 Arthropathy, unspecified: Secondary | ICD-10-CM | POA: Insufficient documentation

## 2021-08-16 DIAGNOSIS — D0511 Intraductal carcinoma in situ of right breast: Secondary | ICD-10-CM | POA: Insufficient documentation

## 2021-08-16 DIAGNOSIS — Z801 Family history of malignant neoplasm of trachea, bronchus and lung: Secondary | ICD-10-CM | POA: Insufficient documentation

## 2021-08-16 DIAGNOSIS — Z85820 Personal history of malignant melanoma of skin: Secondary | ICD-10-CM | POA: Insufficient documentation

## 2021-08-16 DIAGNOSIS — Z87891 Personal history of nicotine dependence: Secondary | ICD-10-CM | POA: Diagnosis not present

## 2021-08-16 DIAGNOSIS — Z85828 Personal history of other malignant neoplasm of skin: Secondary | ICD-10-CM | POA: Insufficient documentation

## 2021-08-16 DIAGNOSIS — Z803 Family history of malignant neoplasm of breast: Secondary | ICD-10-CM | POA: Diagnosis not present

## 2021-08-16 DIAGNOSIS — Z170 Estrogen receptor positive status [ER+]: Secondary | ICD-10-CM | POA: Insufficient documentation

## 2021-08-16 DIAGNOSIS — Z800 Family history of malignant neoplasm of digestive organs: Secondary | ICD-10-CM | POA: Diagnosis not present

## 2021-08-16 DIAGNOSIS — Z79621 Long term (current) use of calcineurin inhibitor: Secondary | ICD-10-CM | POA: Diagnosis not present

## 2021-08-16 NOTE — Progress Notes (Signed)
Follow-up-new appointment. I verified patient's identity and began nursing interview. Patient reports RT axilla tenderness 2/10 at incision line. No other issues reported at this time.  Meaningful use complete Hysterectomy- NO chances of pregnancy.  BP 100/67 (BP Location: Left Arm, Patient Position: Sitting, Cuff Size: Normal)   Pulse (!) 57   Temp (!) 97.5 F (36.4 C) (Temporal)   Resp 18   Ht '5\' 4"'$  (1.626 m)   Wt 115 lb (52.2 kg)   SpO2 100%   BMI 19.74 kg/m

## 2021-08-18 NOTE — Progress Notes (Signed)
Radiation Oncology         [phone] ________________________________  Name: [name]        MRN: [id number]  Date of Service: 08/16/2021 DOB: 01/09/1951  OV:[id number], [name] [place], MD  Caitlin Lose, MD     REFERRING PHYSICIAN: Nicholas Lose, MD   DIAGNOSIS: The encounter diagnosis was Ductal carcinoma in situ (DCIS) of right breast.   HISTORY OF PRESENT ILLNESS: [name] is a 71 y.o. female s with a diagnosis of right breast cancer. The patient was noted to have a screening detected mass in her right breast. Diagnostic imaging revealed a 1.1 cm mass in the 9:00 position. Her axilla was negative for adenopathy. 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   Since her last visit, she has undergone a right breast lumpectomy on 07/19/2021 with Dr. Brantley Stage.  High-grade DCIS with cribriform pattern and comedonecrosis was seen it measured 1.2 cm in maximum dimension approaching to less than 1 mm of the inferior resection margin.  Additional medial inferior lateral and superior resection was specimens were negative for disease.  She is seen to discuss adjuvant radiotherapy.     PREVIOUS RADIATION THERAPY: No   PAST MEDICAL HISTORY:  Past Medical History:  Diagnosis Date   Anxiety    Arthritis    wrist    Atrial flutter with rapid ventricular response (HCC)    SVT per pt    Breast cancer ([place]) 05/2021   right breast DCIS   Bunion    Cataract    mild    Colon polyps    Dysrhythmia 07/2013   SVT   Enthesopathy of ankle and tarsus, unspecified    Esophageal reflux    Family history of breast cancer    Family history of lung cancer    Family history of malignant neoplasm of gastrointestinal tract    Family history of ovarian cancer    Hiatal hernia    Irritable bowel syndrome    Monoallelic mutation of IRJJ8A gene    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[place])    Other acquired calcaneus deformity    Personal history of colonic polyps 2002    hyperplastic   Personal history of malignant melanoma    Personal history of squamous cell carcinoma of skin    Plantar fascial fibromatosis    Rotator cuff syndrome of shoulder and allied disorders    Skin cancer    Syncope        PAST SURGICAL HISTORY: Past Surgical History:  Procedure Laterality Date   ABLATION OF DYSRHYTHMIC FOCUS  07/31/2013   DR Lovena Le   ARTHROPLASTY  2006   thumb/wrist    AUGMENTATION MAMMAPLASTY Bilateral    BRAVO [place] STUDY  02/01/2011   Procedure: BRAVO [place] STUDY;  Surgeon: Inda Castle, MD;  Location: [facility];  Service: Endoscopy;  Laterality: N/A;   BREAST BIOPSY Left 09/2020   BREAST BIOPSY Right 06/20/2021   BREAST EXCISIONAL BIOPSY Left 10/2020   BREAST LUMPECTOMY WITH RADIOACTIVE SEED LOCALIZATION Left 11/03/2020   Procedure: LEFT BREAST LUMPECTOMY WITH RADIOACTIVE SEED LOCALIZATION;  Surgeon: Caitlin Luna, MD;  Location: [place];  Service: General;  Laterality: Left;   BREAST LUMPECTOMY WITH RADIOACTIVE SEED LOCALIZATION Right 07/19/2021   Procedure: RIGHT BREAST LUMPECTOMY WITH RADIOACTIVE SEED LOCALIZATION;  Surgeon: Caitlin Luna, MD;  Location: [place];  Service: General;  Laterality: Right;   [place]  x3   COLONOSCOPY  07/16/2012   ESOPHAGOGASTRODUODENOSCOPY  02/01/2011   ESOPHAGOGASTRODUODENOSCOPY (EGD) WITH PROPOFOL N/A 12/12/2020   Procedure: ESOPHAGOGASTRODUODENOSCOPY (EGD) WITH PROPOFOL;  Surgeon: Irving Copas., MD;  [facility]: [facility];  Service: Gastroenterology;  Laterality: N/A;   EUS N/A 12/12/2020   Procedure: UPPER ENDOSCOPIC ULTRASOUND (EUS) RADIAL;  Surgeon: Irving Copas., MD;  Location: [facility];  Service: Gastroenterology;  Laterality: N/A;   PARTIAL HYSTERECTOMY     Still has ovaries   PLACEMENT OF BREAST IMPLANTS     POLYPECTOMY     SAVORY DILATION N/A 12/12/2020   Procedure: SAVORY DILATION;  Surgeon: Rush Landmark Telford Nab., MD;  Location: [facility];  Service: Gastroenterology;  Laterality: N/A;   SHOULDER SURGERY     SUPRAVENTRICULAR TACHYCARDIA ABLATION N/A 07/31/2013   Procedure: SUPRAVENTRICULAR TACHYCARDIA ABLATION;  Surgeon: Evans Lance, MD;  Location: [facility] CATH LAB;  Service: Cardiovascular;  Laterality: N/A;     FAMILY HISTORY:  Family History  Problem Relation Age of Onset   Colon cancer Father 68   Heart disease Mother    Breast cancer Maternal Aunt        dx 74s   Lung cancer Maternal Aunt        dx 40s, smoker   Ovarian cancer Cousin        dx 8s, maternal first cousin   Lung cancer Maternal Grandfather        dx 68s, smoker   Colon cancer Maternal Uncle        dx 35s   Lung cancer Other        MGF's brother   Colon cancer Nephew 72   Dementia Maternal Aunt    Breast cancer Cousin        dx 109s, maternal first cousin   Ovarian cancer Cousin 50       dysgerminoma, maternal first cousin   Lung cancer Other        MGF's brother   Colon polyps Neg Hx    Esophageal cancer Neg Hx    Rectal cancer Neg Hx    Stomach cancer Neg Hx      SOCIAL HISTORY:  reports that she quit smoking about 43 years ago. Her smoking use included cigarettes. She has never used smokeless tobacco. She reports that she does not currently use alcohol. She reports that she does not use drugs. The patient is divorced and lives in [place]. She keeps her infant grandson during the workdays while her daughter teaches.    ALLERGIES: Patient has no known allergies.   MEDICATIONS:  Current Outpatient Medications  Medication Sig Dispense Refill   cloNIDine (CATAPRES) 0.1 MG tablet Take 0.1 mg by mouth at bedtime.     [employer] SUNSCREEN SPF 30 EX [employer] progest cream once daily     cycloSPORINE (RESTASIS) 0.05 % ophthalmic emulsion Place 1 drop into both eyes 2 (two) times daily.     estradiol (CLIMARA - DOSED IN MG/24 HR) 0.025 mg/24hr patch Place 0.025 mg onto the skin once a week.      gabapentin  (NEURONTIN) 100 MG capsule Take 300 mg by mouth 3 (three) times daily.     hydrOXYzine (ATARAX) 25 MG tablet 25 mg.     ibuprofen (ADVIL) 400 MG tablet Take 400 mg by mouth every 6 (six) hours as needed.     ibuprofen (ADVIL) 800 MG tablet Take 1 tablet (800 mg total) by mouth every 8 (eight) hours as needed. 30 tablet 0  Multiple Vitamin (MULTIVITAMIN WITH MINERALS) TABS tablet Take 1 tablet by mouth daily.     pantoprazole (PROTONIX) 40 MG tablet Take 1 tablet (40 mg total) by mouth daily. 30 tablet 1   Polyethyl Glycol-Propyl Glycol (SYSTANE OP) Place 2 drops into both eyes daily as needed (dry eyes).      valACYclovir (VALTREX) 1000 MG tablet Take 0.5 tablets by mouth 2 (two) times daily as needed. Breakouts (Patient not taking: Reported on 07/13/2021)  0   zolpidem (AMBIEN CR) 12.5 MG CR tablet Take 12.5 mg by mouth at bedtime.     No current facility-administered medications for this encounter.     REVIEW OF SYSTEMS: On review of systems, the patient reports that she is doing well since surgery.  She is still somewhat interested in considering her options for removal of her breast implants at a later time.  I mentioned to her that I had discussed her case with Dr. Claudia Desanctis and he was in agreement to see her if she desires referral.  No other complaints are noted.    PHYSICAL EXAM:  Wt Readings from Last 3 Encounters:  08/16/21 115 lb (52.2 kg)  07/27/21 115 lb 6.4 oz (52.3 kg)  07/19/21 116 lb 2.9 oz (52.7 kg)   Temp Readings from Last 3 Encounters:  08/16/21 (!) 97.5 F (36.4 C) (Temporal)  07/27/21 97.7 F (36.5 C) (Temporal)  07/19/21 97.6 F (36.4 C)   BP Readings from Last 3 Encounters:  08/16/21 100/67  07/27/21 119/71  07/19/21 110/65   Pulse Readings from Last 3 Encounters:  08/16/21 (!) 57  07/27/21 74  07/19/21 (!) 50    In general this is a well appearing caucasian female in no acute distress. She's alert and oriented x4 and appropriate throughout the  examination. Cardiopulmonary assessment is negative for acute distress and she exhibits normal effort.  Her right breast reveals a small amount of breast tissue, but her in situ implant is noted, as well as a well-healed surgical incision site without erythema separation or drainage.    ECOG = 0  0 - Asymptomatic (Fully active, able to carry on all predisease activities without restriction)  1 - Symptomatic but completely ambulatory (Restricted in physically strenuous activity but ambulatory and able to carry out work of a light or sedentary nature. For example, light housework, office work)  2 - Symptomatic, <50% in bed during the day (Ambulatory and capable of all self care but unable to carry out any work activities. Up and about more than 50% of waking hours)  3 - Symptomatic, >50% in bed, but not bedbound (Capable of only limited self-care, confined to bed or chair 50% or more of waking hours)  4 - Bedbound (Completely disabled. Cannot carry on any self-care. Totally confined to bed or chair)  5 - Death   Eustace Pen MM, Creech RH, Tormey DC, et al. [phone]). "Toxicity and response criteria of the [facility]". [place] Oncol. 5 (6): 649-55    LABORATORY DATA:  Lab Results  Component Value Date   WBC 5.3 11/05/2016   HGB 13.9 11/05/2016   HCT 41.6 11/05/2016   MCV 92.4 11/05/2016   PLT 308.0 11/05/2016   Lab Results  Component Value Date   NA 140 11/05/2016   K 3.7 11/05/2016   CL 102 11/05/2016   CO2 30 11/05/2016   Lab Results  Component Value Date   ALT 14 11/05/2016   AST 22 11/05/2016   ALKPHOS 65  11/05/2016   BILITOT 0.4 11/05/2016      RADIOGRAPHY: MM Breast Surgical Specimen  Result Date: 07/19/2021 CLINICAL DATA:  Evaluate specimen EXAM: SPECIMEN RADIOGRAPH OF THE RIGHT BREAST COMPARISON:  None Available. FINDINGS: Status post excision of the right breast. The radioactive seed and biopsy marker clip are present, completely intact, and  were marked for pathology. IMPRESSION: Specimen radiograph of the right breast. Electronically Signed   By: Dorise Bullion III M.D.   On: 07/19/2021 11:21      IMPRESSION/PLAN: 1. High grade, ER/PR positive DCIS of the right breast. Dr. Lisbeth Renshaw has reviewed her final pathology results.  We reviewed the nature of early stage breast cancer and the rationale for external radiotherapy to the breast  to reduce risks of local recurrence.  She will continue to follow with Dr. Quintella Reichert regarding antiestrogen therapy which would be initiated at the conclusion of her radiation.  We discussed the risks, benefits, short, and long term effects of radiotherapy, as well as the curative intent, and the patient is interested in proceeding.  I reviewed the delivery and logistics of radiotherapy and Dr. Ida Rogue recommendation for 6-1/2 weeks of radiotherapy given her in situ implants.  At our last conversation we were not sure if these were going to be removed prior to radiotherapy but she can discuss this further with Dr. Claudia Desanctis after the conclusion of radiation if she desires. Written consent is obtained and placed in the chart, a copy was provided to the patient.  She will simulate today.   In a visit lasting 45 minutes, greater than 50% of the time was spent face to face reviewing her case, as well as in preparation of, discussing, and coordinating the patient's care.       Carola Rhine, [facility]    **Disclaimer: This note was dictated with voice recognition software. Similar sounding words can inadvertently be transcribed and this note may contain transcription errors which may not have been corrected upon publication of note.**

## 2021-08-23 ENCOUNTER — Encounter: Payer: Self-pay | Admitting: *Deleted

## 2021-08-23 DIAGNOSIS — D0511 Intraductal carcinoma in situ of right breast: Secondary | ICD-10-CM | POA: Diagnosis present

## 2021-08-24 ENCOUNTER — Other Ambulatory Visit: Payer: Self-pay

## 2021-08-24 ENCOUNTER — Ambulatory Visit
Admission: RE | Admit: 2021-08-24 | Discharge: 2021-08-24 | Disposition: A | Discharge status: Home / Self Care | Payer: Medicare Other | Source: Ambulatory Visit | Attending: Radiation Oncology | Admitting: Radiation Oncology

## 2021-08-24 DIAGNOSIS — D0511 Intraductal carcinoma in situ of right breast: Secondary | ICD-10-CM | POA: Diagnosis not present

## 2021-08-24 LAB — RAD ONC ARIA SESSION SUMMARY
Course Elapsed Days: 0
Plan Fractions Treated to Date: 1
Plan Prescribed Dose Per Fraction: 1.8 Gy
Plan Total Fractions Prescribed: 28
Plan Total Prescribed Dose: 50.4 Gy
Reference Point Dosage Given to Date: 1.8 Gy
Reference Point Session Dosage Given: 1.8 Gy
Session Number: 1

## 2021-08-25 ENCOUNTER — Ambulatory Visit
Admission: RE | Admit: 2021-08-25 | Discharge: 2021-08-25 | Disposition: A | Discharge status: Home / Self Care | Payer: Medicare Other | Source: Ambulatory Visit | Attending: Radiation Oncology | Admitting: Radiation Oncology

## 2021-08-25 ENCOUNTER — Other Ambulatory Visit: Payer: Self-pay

## 2021-08-25 DIAGNOSIS — D0511 Intraductal carcinoma in situ of right breast: Secondary | ICD-10-CM | POA: Diagnosis not present

## 2021-08-25 LAB — RAD ONC ARIA SESSION SUMMARY
Course Elapsed Days: 1
Plan Fractions Treated to Date: 2
Plan Prescribed Dose Per Fraction: 1.8 Gy
Plan Total Fractions Prescribed: 28
Plan Total Prescribed Dose: 50.4 Gy
Reference Point Dosage Given to Date: 3.6 Gy
Reference Point Session Dosage Given: 1.8 Gy
Session Number: 2

## 2021-08-25 MED ORDER — RADIAPLEXRX EX GEL: Freq: Once | CUTANEOUS | Status: AC

## 2021-08-25 MED ORDER — ALRA NON-METALLIC DEODORANT (RAD-ONC)
1.0000 "application " | Freq: Once | TOPICAL | Status: AC
  Administered 2021-08-25: 1 via TOPICAL

## 2021-08-25 NOTE — Progress Notes (Signed)
Pt here for patient teaching.  Pt given Radiation and You booklet, skin care instructions, alra deodorant and Radiaplex.    Reviewed areas of pertinence such as fatigue, hair loss, skin changes, breast tenderness, and breast swelling. Pt able to give teach back of to pat skin and use unscented/gentle soap,apply Radiaplex bid, avoid applying anything to skin within 4 hours of treatment, avoid wearing an under wire bra, and to use an electric razor if they must shave. Pt verbalizes understanding of information given and will contact nursing with any questions or concerns.    Kodie Pick M. Demerius Podolak RN, BSN  

## 2021-08-28 ENCOUNTER — Other Ambulatory Visit: Payer: Self-pay

## 2021-08-28 ENCOUNTER — Ambulatory Visit
Admission: RE | Admit: 2021-08-28 | Discharge: 2021-08-28 | Disposition: A | Discharge status: Home / Self Care | Payer: Medicare Other | Source: Ambulatory Visit | Attending: Radiation Oncology | Admitting: Radiation Oncology

## 2021-08-28 DIAGNOSIS — D0511 Intraductal carcinoma in situ of right breast: Secondary | ICD-10-CM | POA: Diagnosis not present

## 2021-08-28 LAB — RAD ONC ARIA SESSION SUMMARY
Course Elapsed Days: 4
Plan Fractions Treated to Date: 3
Plan Prescribed Dose Per Fraction: 1.8 Gy
Plan Total Fractions Prescribed: 28
Plan Total Prescribed Dose: 50.4 Gy
Reference Point Dosage Given to Date: 5.4 Gy
Reference Point Session Dosage Given: 1.8 Gy
Session Number: 3

## 2021-08-29 ENCOUNTER — Other Ambulatory Visit: Payer: Self-pay

## 2021-08-29 ENCOUNTER — Ambulatory Visit
Admission: RE | Admit: 2021-08-29 | Discharge: 2021-08-29 | Disposition: A | Discharge status: Home / Self Care | Payer: Medicare Other | Source: Ambulatory Visit | Attending: Radiation Oncology | Admitting: Radiation Oncology

## 2021-08-29 DIAGNOSIS — D0511 Intraductal carcinoma in situ of right breast: Secondary | ICD-10-CM | POA: Diagnosis not present

## 2021-08-29 LAB — RAD ONC ARIA SESSION SUMMARY
Course Elapsed Days: 5
Plan Fractions Treated to Date: 4
Plan Prescribed Dose Per Fraction: 1.8 Gy
Plan Total Fractions Prescribed: 28
Plan Total Prescribed Dose: 50.4 Gy
Reference Point Dosage Given to Date: 7.2 Gy
Reference Point Session Dosage Given: 1.8 Gy
Session Number: 4

## 2021-08-30 ENCOUNTER — Other Ambulatory Visit: Payer: Self-pay

## 2021-08-30 ENCOUNTER — Ambulatory Visit
Admission: RE | Admit: 2021-08-30 | Discharge: 2021-08-30 | Disposition: A | Discharge status: Home / Self Care | Payer: Medicare Other | Source: Ambulatory Visit | Attending: Radiation Oncology | Admitting: Radiation Oncology

## 2021-08-30 DIAGNOSIS — D0511 Intraductal carcinoma in situ of right breast: Secondary | ICD-10-CM | POA: Diagnosis not present

## 2021-08-30 LAB — RAD ONC ARIA SESSION SUMMARY
Course Elapsed Days: 6
Plan Fractions Treated to Date: 5
Plan Prescribed Dose Per Fraction: 1.8 Gy
Plan Total Fractions Prescribed: 28
Plan Total Prescribed Dose: 50.4 Gy
Reference Point Dosage Given to Date: 9 Gy
Reference Point Session Dosage Given: 1.8 Gy
Session Number: 5

## 2021-08-31 ENCOUNTER — Other Ambulatory Visit: Payer: Self-pay

## 2021-08-31 ENCOUNTER — Ambulatory Visit
Admission: RE | Admit: 2021-08-31 | Discharge: 2021-08-31 | Disposition: A | Discharge status: Home / Self Care | Payer: Medicare Other | Source: Ambulatory Visit | Attending: Radiation Oncology | Admitting: Radiation Oncology

## 2021-08-31 DIAGNOSIS — D0511 Intraductal carcinoma in situ of right breast: Secondary | ICD-10-CM | POA: Diagnosis not present

## 2021-08-31 LAB — RAD ONC ARIA SESSION SUMMARY
Course Elapsed Days: 7
Plan Fractions Treated to Date: 6
Plan Prescribed Dose Per Fraction: 1.8 Gy
Plan Total Fractions Prescribed: 28
Plan Total Prescribed Dose: 50.4 Gy
Reference Point Dosage Given to Date: 10.8 Gy
Reference Point Session Dosage Given: 1.8 Gy
Session Number: 6

## 2021-09-01 ENCOUNTER — Other Ambulatory Visit: Payer: Self-pay

## 2021-09-01 ENCOUNTER — Ambulatory Visit
Admission: RE | Admit: 2021-09-01 | Discharge: 2021-09-01 | Disposition: A | Discharge status: Home / Self Care | Payer: Medicare Other | Source: Ambulatory Visit | Attending: Radiation Oncology | Admitting: Radiation Oncology

## 2021-09-01 DIAGNOSIS — D0511 Intraductal carcinoma in situ of right breast: Secondary | ICD-10-CM | POA: Diagnosis not present

## 2021-09-01 LAB — RAD ONC ARIA SESSION SUMMARY
Course Elapsed Days: 8
Plan Fractions Treated to Date: 7
Plan Prescribed Dose Per Fraction: 1.8 Gy
Plan Total Fractions Prescribed: 28
Plan Total Prescribed Dose: 50.4 Gy
Reference Point Dosage Given to Date: 12.6 Gy
Reference Point Session Dosage Given: 1.8 Gy
Session Number: 7

## 2021-09-04 ENCOUNTER — Other Ambulatory Visit: Payer: Self-pay

## 2021-09-04 ENCOUNTER — Ambulatory Visit
Admission: RE | Admit: 2021-09-04 | Discharge: 2021-09-04 | Disposition: A | Discharge status: Home / Self Care | Payer: Medicare Other | Source: Ambulatory Visit | Attending: Radiation Oncology | Admitting: Radiation Oncology

## 2021-09-04 DIAGNOSIS — D0511 Intraductal carcinoma in situ of right breast: Secondary | ICD-10-CM | POA: Diagnosis not present

## 2021-09-04 LAB — RAD ONC ARIA SESSION SUMMARY
Course Elapsed Days: 11
Plan Fractions Treated to Date: 8
Plan Prescribed Dose Per Fraction: 1.8 Gy
Plan Total Fractions Prescribed: 28
Plan Total Prescribed Dose: 50.4 Gy
Reference Point Dosage Given to Date: 14.4 Gy
Reference Point Session Dosage Given: 1.8 Gy
Session Number: 8

## 2021-09-05 ENCOUNTER — Ambulatory Visit
Admission: RE | Admit: 2021-09-05 | Discharge: 2021-09-05 | Disposition: A | Discharge status: Home / Self Care | Payer: Medicare Other | Source: Ambulatory Visit | Attending: Radiation Oncology | Admitting: Radiation Oncology

## 2021-09-05 ENCOUNTER — Other Ambulatory Visit: Payer: Self-pay

## 2021-09-05 DIAGNOSIS — D0511 Intraductal carcinoma in situ of right breast: Secondary | ICD-10-CM | POA: Diagnosis not present

## 2021-09-05 LAB — RAD ONC ARIA SESSION SUMMARY
Course Elapsed Days: 12
Plan Fractions Treated to Date: 9
Plan Prescribed Dose Per Fraction: 1.8 Gy
Plan Total Fractions Prescribed: 28
Plan Total Prescribed Dose: 50.4 Gy
Reference Point Dosage Given to Date: 16.2 Gy
Reference Point Session Dosage Given: 1.8 Gy
Session Number: 9

## 2021-09-06 ENCOUNTER — Other Ambulatory Visit: Payer: Self-pay

## 2021-09-06 ENCOUNTER — Ambulatory Visit
Admission: RE | Admit: 2021-09-06 | Discharge: 2021-09-06 | Disposition: A | Discharge status: Home / Self Care | Payer: Medicare Other | Source: Ambulatory Visit | Attending: Radiation Oncology | Admitting: Radiation Oncology

## 2021-09-06 DIAGNOSIS — D0511 Intraductal carcinoma in situ of right breast: Secondary | ICD-10-CM | POA: Diagnosis not present

## 2021-09-06 LAB — RAD ONC ARIA SESSION SUMMARY
Course Elapsed Days: 13
Plan Fractions Treated to Date: 10
Plan Prescribed Dose Per Fraction: 1.8 Gy
Plan Total Fractions Prescribed: 28
Plan Total Prescribed Dose: 50.4 Gy
Reference Point Dosage Given to Date: 18 Gy
Reference Point Session Dosage Given: 1.8 Gy
Session Number: 10

## 2021-09-07 ENCOUNTER — Other Ambulatory Visit: Payer: Self-pay

## 2021-09-07 ENCOUNTER — Ambulatory Visit
Admission: RE | Admit: 2021-09-07 | Discharge: 2021-09-07 | Disposition: A | Discharge status: Home / Self Care | Payer: Medicare Other | Source: Ambulatory Visit | Attending: Radiation Oncology | Admitting: Radiation Oncology

## 2021-09-07 DIAGNOSIS — D0511 Intraductal carcinoma in situ of right breast: Secondary | ICD-10-CM | POA: Diagnosis not present

## 2021-09-07 LAB — RAD ONC ARIA SESSION SUMMARY
Course Elapsed Days: 14
Plan Fractions Treated to Date: 11
Plan Prescribed Dose Per Fraction: 1.8 Gy
Plan Total Fractions Prescribed: 28
Plan Total Prescribed Dose: 50.4 Gy
Reference Point Dosage Given to Date: 19.8 Gy
Reference Point Session Dosage Given: 1.8 Gy
Session Number: 11

## 2021-09-08 ENCOUNTER — Other Ambulatory Visit: Payer: Self-pay

## 2021-09-08 ENCOUNTER — Ambulatory Visit
Admission: RE | Admit: 2021-09-08 | Discharge: 2021-09-08 | Disposition: A | Discharge status: Home / Self Care | Payer: Medicare Other | Source: Ambulatory Visit | Attending: Radiation Oncology | Admitting: Radiation Oncology

## 2021-09-08 DIAGNOSIS — D0511 Intraductal carcinoma in situ of right breast: Secondary | ICD-10-CM | POA: Diagnosis not present

## 2021-09-08 LAB — RAD ONC ARIA SESSION SUMMARY
Course Elapsed Days: 15
Plan Fractions Treated to Date: 12
Plan Prescribed Dose Per Fraction: 1.8 Gy
Plan Total Fractions Prescribed: 28
Plan Total Prescribed Dose: 50.4 Gy
Reference Point Dosage Given to Date: 21.6 Gy
Reference Point Session Dosage Given: 1.8 Gy
Session Number: 12

## 2021-09-11 ENCOUNTER — Ambulatory Visit
Admission: RE | Admit: 2021-09-11 | Discharge: 2021-09-11 | Disposition: A | Discharge status: Home / Self Care | Payer: Medicare Other | Source: Ambulatory Visit | Attending: Radiation Oncology | Admitting: Radiation Oncology

## 2021-09-11 ENCOUNTER — Other Ambulatory Visit: Payer: Self-pay

## 2021-09-11 DIAGNOSIS — D0511 Intraductal carcinoma in situ of right breast: Secondary | ICD-10-CM | POA: Diagnosis not present

## 2021-09-11 LAB — RAD ONC ARIA SESSION SUMMARY
Course Elapsed Days: 18
Plan Fractions Treated to Date: 13
Plan Prescribed Dose Per Fraction: 1.8 Gy
Plan Total Fractions Prescribed: 28
Plan Total Prescribed Dose: 50.4 Gy
Reference Point Dosage Given to Date: 23.4 Gy
Reference Point Session Dosage Given: 1.8 Gy
Session Number: 13

## 2021-09-12 ENCOUNTER — Ambulatory Visit
Admission: RE | Admit: 2021-09-12 | Discharge: 2021-09-12 | Disposition: A | Discharge status: Home / Self Care | Payer: Medicare Other | Source: Ambulatory Visit | Attending: Radiation Oncology | Admitting: Radiation Oncology

## 2021-09-12 ENCOUNTER — Other Ambulatory Visit: Payer: Self-pay

## 2021-09-12 DIAGNOSIS — D0511 Intraductal carcinoma in situ of right breast: Secondary | ICD-10-CM | POA: Diagnosis not present

## 2021-09-12 LAB — RAD ONC ARIA SESSION SUMMARY
Course Elapsed Days: 19
Plan Fractions Treated to Date: 14
Plan Prescribed Dose Per Fraction: 1.8 Gy
Plan Total Fractions Prescribed: 28
Plan Total Prescribed Dose: 50.4 Gy
Reference Point Dosage Given to Date: 25.2 Gy
Reference Point Session Dosage Given: 1.8 Gy
Session Number: 14

## 2021-09-13 ENCOUNTER — Ambulatory Visit
Admission: RE | Admit: 2021-09-13 | Discharge: 2021-09-13 | Disposition: A | Discharge status: Home / Self Care | Payer: Medicare Other | Source: Ambulatory Visit | Attending: Radiation Oncology | Admitting: Radiation Oncology

## 2021-09-13 ENCOUNTER — Other Ambulatory Visit: Payer: Self-pay

## 2021-09-13 DIAGNOSIS — D0511 Intraductal carcinoma in situ of right breast: Secondary | ICD-10-CM | POA: Diagnosis not present

## 2021-09-13 LAB — RAD ONC ARIA SESSION SUMMARY
Course Elapsed Days: 20
Plan Fractions Treated to Date: 15
Plan Prescribed Dose Per Fraction: 1.8 Gy
Plan Total Fractions Prescribed: 28
Plan Total Prescribed Dose: 50.4 Gy
Reference Point Dosage Given to Date: 27 Gy
Reference Point Session Dosage Given: 1.8 Gy
Session Number: 15

## 2021-09-14 ENCOUNTER — Other Ambulatory Visit: Payer: Self-pay

## 2021-09-14 ENCOUNTER — Ambulatory Visit
Admission: RE | Admit: 2021-09-14 | Discharge: 2021-09-14 | Disposition: A | Discharge status: Home / Self Care | Payer: Medicare Other | Source: Ambulatory Visit | Attending: Radiation Oncology | Admitting: Radiation Oncology

## 2021-09-14 DIAGNOSIS — D0511 Intraductal carcinoma in situ of right breast: Secondary | ICD-10-CM | POA: Diagnosis not present

## 2021-09-14 LAB — RAD ONC ARIA SESSION SUMMARY
Course Elapsed Days: 21
Plan Fractions Treated to Date: 16
Plan Prescribed Dose Per Fraction: 1.8 Gy
Plan Total Fractions Prescribed: 28
Plan Total Prescribed Dose: 50.4 Gy
Reference Point Dosage Given to Date: 28.8 Gy
Reference Point Session Dosage Given: 1.8 Gy
Session Number: 16

## 2021-09-15 ENCOUNTER — Other Ambulatory Visit: Payer: Self-pay

## 2021-09-15 ENCOUNTER — Ambulatory Visit
Admission: RE | Admit: 2021-09-15 | Discharge: 2021-09-15 | Disposition: A | Discharge status: Home / Self Care | Payer: Medicare Other | Source: Ambulatory Visit | Attending: Radiation Oncology | Admitting: Radiation Oncology

## 2021-09-15 DIAGNOSIS — D0511 Intraductal carcinoma in situ of right breast: Secondary | ICD-10-CM | POA: Diagnosis not present

## 2021-09-15 LAB — RAD ONC ARIA SESSION SUMMARY
Course Elapsed Days: 22
Plan Fractions Treated to Date: 17
Plan Prescribed Dose Per Fraction: 1.8 Gy
Plan Total Fractions Prescribed: 28
Plan Total Prescribed Dose: 50.4 Gy
Reference Point Dosage Given to Date: 30.6 Gy
Reference Point Session Dosage Given: 1.8 Gy
Session Number: 17

## 2021-09-18 ENCOUNTER — Other Ambulatory Visit: Payer: Self-pay

## 2021-09-18 ENCOUNTER — Ambulatory Visit
Admission: RE | Admit: 2021-09-18 | Discharge: 2021-09-18 | Disposition: A | Discharge status: Home / Self Care | Payer: Medicare Other | Source: Ambulatory Visit | Attending: Radiation Oncology | Admitting: Radiation Oncology

## 2021-09-18 DIAGNOSIS — D0511 Intraductal carcinoma in situ of right breast: Secondary | ICD-10-CM | POA: Diagnosis present

## 2021-09-18 DIAGNOSIS — Z510 Encounter for antineoplastic radiation therapy: Secondary | ICD-10-CM | POA: Insufficient documentation

## 2021-09-18 LAB — RAD ONC ARIA SESSION SUMMARY
Course Elapsed Days: 25
Plan Fractions Treated to Date: 18
Plan Prescribed Dose Per Fraction: 1.8 Gy
Plan Total Fractions Prescribed: 28
Plan Total Prescribed Dose: 50.4 Gy
Reference Point Dosage Given to Date: 32.4 Gy
Reference Point Session Dosage Given: 1.8 Gy
Session Number: 18

## 2021-09-20 ENCOUNTER — Other Ambulatory Visit: Payer: Self-pay

## 2021-09-20 ENCOUNTER — Ambulatory Visit
Admission: RE | Admit: 2021-09-20 | Discharge: 2021-09-20 | Disposition: A | Discharge status: Home / Self Care | Payer: Medicare Other | Source: Ambulatory Visit | Attending: Radiation Oncology | Admitting: Radiation Oncology

## 2021-09-20 DIAGNOSIS — Z510 Encounter for antineoplastic radiation therapy: Secondary | ICD-10-CM | POA: Diagnosis not present

## 2021-09-20 LAB — RAD ONC ARIA SESSION SUMMARY
Course Elapsed Days: 27
Plan Fractions Treated to Date: 19
Plan Prescribed Dose Per Fraction: 1.8 Gy
Plan Total Fractions Prescribed: 28
Plan Total Prescribed Dose: 50.4 Gy
Reference Point Dosage Given to Date: 34.2 Gy
Reference Point Session Dosage Given: 1.8 Gy
Session Number: 19

## 2021-09-21 ENCOUNTER — Ambulatory Visit
Admission: RE | Admit: 2021-09-21 | Discharge: 2021-09-21 | Disposition: A | Discharge status: Home / Self Care | Payer: Medicare Other | Source: Ambulatory Visit | Attending: Radiation Oncology | Admitting: Radiation Oncology

## 2021-09-21 ENCOUNTER — Other Ambulatory Visit: Payer: Self-pay

## 2021-09-21 DIAGNOSIS — Z510 Encounter for antineoplastic radiation therapy: Secondary | ICD-10-CM | POA: Diagnosis not present

## 2021-09-21 LAB — RAD ONC ARIA SESSION SUMMARY
Course Elapsed Days: 28
Plan Fractions Treated to Date: 20
Plan Prescribed Dose Per Fraction: 1.8 Gy
Plan Total Fractions Prescribed: 28
Plan Total Prescribed Dose: 50.4 Gy
Reference Point Dosage Given to Date: 36 Gy
Reference Point Session Dosage Given: 1.8 Gy
Session Number: 20

## 2021-09-22 ENCOUNTER — Ambulatory Visit
Admission: RE | Admit: 2021-09-22 | Discharge: 2021-09-22 | Disposition: A | Discharge status: Home / Self Care | Payer: Medicare Other | Source: Ambulatory Visit | Attending: Radiation Oncology | Admitting: Radiation Oncology

## 2021-09-22 ENCOUNTER — Other Ambulatory Visit: Payer: Self-pay

## 2021-09-22 DIAGNOSIS — Z510 Encounter for antineoplastic radiation therapy: Secondary | ICD-10-CM | POA: Diagnosis not present

## 2021-09-22 LAB — RAD ONC ARIA SESSION SUMMARY
Course Elapsed Days: 29
Plan Fractions Treated to Date: 21
Plan Prescribed Dose Per Fraction: 1.8 Gy
Plan Total Fractions Prescribed: 28
Plan Total Prescribed Dose: 50.4 Gy
Reference Point Dosage Given to Date: 37.8 Gy
Reference Point Session Dosage Given: 1.8 Gy
Session Number: 21

## 2021-09-25 ENCOUNTER — Ambulatory Visit: Payer: Medicare Other

## 2021-09-26 ENCOUNTER — Ambulatory Visit
Admission: RE | Admit: 2021-09-26 | Discharge: 2021-09-26 | Disposition: A | Discharge status: Home / Self Care | Payer: Medicare Other | Source: Ambulatory Visit | Attending: Radiation Oncology | Admitting: Radiation Oncology

## 2021-09-26 ENCOUNTER — Other Ambulatory Visit: Payer: Self-pay

## 2021-09-26 DIAGNOSIS — Z510 Encounter for antineoplastic radiation therapy: Secondary | ICD-10-CM | POA: Diagnosis not present

## 2021-09-26 LAB — RAD ONC ARIA SESSION SUMMARY
Course Elapsed Days: 33
Plan Fractions Treated to Date: 22
Plan Prescribed Dose Per Fraction: 1.8 Gy
Plan Total Fractions Prescribed: 28
Plan Total Prescribed Dose: 50.4 Gy
Reference Point Dosage Given to Date: 39.6 Gy
Reference Point Session Dosage Given: 1.8 Gy
Session Number: 22

## 2021-09-27 ENCOUNTER — Other Ambulatory Visit: Payer: Self-pay

## 2021-09-27 ENCOUNTER — Ambulatory Visit
Admission: RE | Admit: 2021-09-27 | Discharge: 2021-09-27 | Disposition: A | Discharge status: Home / Self Care | Payer: Medicare Other | Source: Ambulatory Visit | Attending: Radiation Oncology | Admitting: Radiation Oncology

## 2021-09-27 DIAGNOSIS — Z510 Encounter for antineoplastic radiation therapy: Secondary | ICD-10-CM | POA: Diagnosis not present

## 2021-09-27 LAB — RAD ONC ARIA SESSION SUMMARY
Course Elapsed Days: 34
Plan Fractions Treated to Date: 23
Plan Prescribed Dose Per Fraction: 1.8 Gy
Plan Total Fractions Prescribed: 28
Plan Total Prescribed Dose: 50.4 Gy
Reference Point Dosage Given to Date: 41.4 Gy
Reference Point Session Dosage Given: 1.8 Gy
Session Number: 23

## 2021-09-28 ENCOUNTER — Other Ambulatory Visit: Payer: Self-pay

## 2021-09-28 ENCOUNTER — Ambulatory Visit
Admission: RE | Admit: 2021-09-28 | Discharge: 2021-09-28 | Disposition: A | Discharge status: Home / Self Care | Payer: Medicare Other | Source: Ambulatory Visit | Attending: Radiation Oncology | Admitting: Radiation Oncology

## 2021-09-28 DIAGNOSIS — Z510 Encounter for antineoplastic radiation therapy: Secondary | ICD-10-CM | POA: Diagnosis not present

## 2021-09-28 LAB — RAD ONC ARIA SESSION SUMMARY
Course Elapsed Days: 35
Plan Fractions Treated to Date: 24
Plan Prescribed Dose Per Fraction: 1.8 Gy
Plan Total Fractions Prescribed: 28
Plan Total Prescribed Dose: 50.4 Gy
Reference Point Dosage Given to Date: 43.2 Gy
Reference Point Session Dosage Given: 1.8 Gy
Session Number: 24

## 2021-09-29 ENCOUNTER — Ambulatory Visit: Payer: Medicare Other | Admitting: Radiation Oncology

## 2021-09-29 ENCOUNTER — Ambulatory Visit
Admission: RE | Admit: 2021-09-29 | Discharge: 2021-09-29 | Disposition: A | Discharge status: Home / Self Care | Payer: Medicare Other | Source: Ambulatory Visit | Attending: Radiation Oncology | Admitting: Radiation Oncology

## 2021-09-29 ENCOUNTER — Other Ambulatory Visit: Payer: Self-pay

## 2021-09-29 DIAGNOSIS — D0511 Intraductal carcinoma in situ of right breast: Secondary | ICD-10-CM

## 2021-09-29 DIAGNOSIS — Z510 Encounter for antineoplastic radiation therapy: Secondary | ICD-10-CM | POA: Diagnosis not present

## 2021-09-29 LAB — RAD ONC ARIA SESSION SUMMARY
Course Elapsed Days: 36
Plan Fractions Treated to Date: 25
Plan Prescribed Dose Per Fraction: 1.8 Gy
Plan Total Fractions Prescribed: 28
Plan Total Prescribed Dose: 50.4 Gy
Reference Point Dosage Given to Date: 45 Gy
Reference Point Session Dosage Given: 1.8 Gy
Session Number: 25

## 2021-09-29 MED ORDER — RADIAPLEXRX EX GEL: Freq: Once | CUTANEOUS | Status: AC

## 2021-10-02 ENCOUNTER — Ambulatory Visit: Payer: Medicare Other

## 2021-10-02 ENCOUNTER — Other Ambulatory Visit: Payer: Self-pay

## 2021-10-02 ENCOUNTER — Ambulatory Visit
Admission: RE | Admit: 2021-10-02 | Discharge: 2021-10-02 | Disposition: A | Discharge status: Home / Self Care | Payer: Medicare Other | Source: Ambulatory Visit | Attending: Radiation Oncology | Admitting: Radiation Oncology

## 2021-10-02 DIAGNOSIS — Z510 Encounter for antineoplastic radiation therapy: Secondary | ICD-10-CM | POA: Diagnosis not present

## 2021-10-02 LAB — RAD ONC ARIA SESSION SUMMARY
Course Elapsed Days: 39
Plan Fractions Treated to Date: 26
Plan Prescribed Dose Per Fraction: 1.8 Gy
Plan Total Fractions Prescribed: 28
Plan Total Prescribed Dose: 50.4 Gy
Reference Point Dosage Given to Date: 46.8 Gy
Reference Point Session Dosage Given: 1.8 Gy
Session Number: 26

## 2021-10-03 ENCOUNTER — Other Ambulatory Visit: Payer: Self-pay

## 2021-10-03 ENCOUNTER — Ambulatory Visit: Payer: Medicare Other

## 2021-10-03 ENCOUNTER — Ambulatory Visit
Admission: RE | Admit: 2021-10-03 | Discharge: 2021-10-03 | Disposition: A | Discharge status: Home / Self Care | Payer: Medicare Other | Source: Ambulatory Visit | Attending: Radiation Oncology | Admitting: Radiation Oncology

## 2021-10-03 DIAGNOSIS — Z510 Encounter for antineoplastic radiation therapy: Secondary | ICD-10-CM | POA: Diagnosis not present

## 2021-10-03 LAB — RAD ONC ARIA SESSION SUMMARY
Course Elapsed Days: 40
Plan Fractions Treated to Date: 27
Plan Prescribed Dose Per Fraction: 1.8 Gy
Plan Total Fractions Prescribed: 28
Plan Total Prescribed Dose: 50.4 Gy
Reference Point Dosage Given to Date: 48.6 Gy
Reference Point Session Dosage Given: 1.8 Gy
Session Number: 27

## 2021-10-04 ENCOUNTER — Other Ambulatory Visit: Payer: Self-pay

## 2021-10-04 ENCOUNTER — Ambulatory Visit
Admission: RE | Admit: 2021-10-04 | Discharge: 2021-10-04 | Disposition: A | Discharge status: Home / Self Care | Payer: Medicare Other | Source: Ambulatory Visit | Attending: Radiation Oncology | Admitting: Radiation Oncology

## 2021-10-04 ENCOUNTER — Ambulatory Visit: Payer: Medicare Other | Admitting: Radiation Oncology

## 2021-10-04 DIAGNOSIS — Z510 Encounter for antineoplastic radiation therapy: Secondary | ICD-10-CM | POA: Diagnosis not present

## 2021-10-04 LAB — RAD ONC ARIA SESSION SUMMARY
Course Elapsed Days: 41
Plan Fractions Treated to Date: 28
Plan Prescribed Dose Per Fraction: 1.8 Gy
Plan Total Fractions Prescribed: 28
Plan Total Prescribed Dose: 50.4 Gy
Reference Point Dosage Given to Date: 50.4 Gy
Reference Point Session Dosage Given: 1.8 Gy
Session Number: 28

## 2021-10-05 ENCOUNTER — Ambulatory Visit
Admission: RE | Admit: 2021-10-05 | Discharge: 2021-10-05 | Disposition: A | Discharge status: Home / Self Care | Payer: Medicare Other | Source: Ambulatory Visit | Attending: Radiation Oncology | Admitting: Radiation Oncology

## 2021-10-05 ENCOUNTER — Ambulatory Visit: Payer: Medicare Other

## 2021-10-05 ENCOUNTER — Other Ambulatory Visit: Payer: Self-pay

## 2021-10-05 DIAGNOSIS — Z510 Encounter for antineoplastic radiation therapy: Secondary | ICD-10-CM | POA: Diagnosis not present

## 2021-10-05 LAB — RAD ONC ARIA SESSION SUMMARY
Course Elapsed Days: 42
Plan Fractions Treated to Date: 1
Plan Prescribed Dose Per Fraction: 2 Gy
Plan Total Fractions Prescribed: 5
Plan Total Prescribed Dose: 10 Gy
Reference Point Dosage Given to Date: 52.4 Gy
Reference Point Session Dosage Given: 2 Gy
Session Number: 29

## 2021-10-06 ENCOUNTER — Other Ambulatory Visit: Payer: Self-pay

## 2021-10-06 ENCOUNTER — Ambulatory Visit
Admission: RE | Admit: 2021-10-06 | Discharge: 2021-10-06 | Disposition: A | Discharge status: Home / Self Care | Payer: Medicare Other | Source: Ambulatory Visit | Attending: Radiation Oncology | Admitting: Radiation Oncology

## 2021-10-06 ENCOUNTER — Ambulatory Visit: Payer: Medicare Other

## 2021-10-06 DIAGNOSIS — Z510 Encounter for antineoplastic radiation therapy: Secondary | ICD-10-CM | POA: Diagnosis not present

## 2021-10-06 LAB — RAD ONC ARIA SESSION SUMMARY
Course Elapsed Days: 43
Plan Fractions Treated to Date: 2
Plan Prescribed Dose Per Fraction: 2 Gy
Plan Total Fractions Prescribed: 5
Plan Total Prescribed Dose: 10 Gy
Reference Point Dosage Given to Date: 54.4 Gy
Reference Point Session Dosage Given: 2 Gy
Session Number: 30

## 2021-10-06 NOTE — Progress Notes (Signed)
Patient Care Team: Josetta Huddle, MD as PCP - General (Internal Medicine) Mauro Kaufmann, RN as Oncology Nurse Navigator Rockwell Germany, RN as Oncology Nurse Navigator Nicholas Lose, MD as Consulting Physician (Hematology and Oncology)  DIAGNOSIS: No diagnosis found.  SUMMARY OF ONCOLOGIC HISTORY: Oncology History  Ductal carcinoma in situ (DCIS) of right breast  06/20/2021 Initial Diagnosis   Screening detected Right Breast indeterminate mass at 9 o'clock position: 1.1 cm biopsy revealed intermediate grade DCIS ER 100%, PR 100%   07/19/2021 Surgery   Right Lumpectomy: HG DCIS with cribriform pattern and comedonecrosis 1.2 cm, Margins Negative, ER 100%, PR 100%     CHIEF COMPLIANT:   INTERVAL HISTORY: [name] is a   ALLERGIES:  has No Known Allergies.  MEDICATIONS:  Current Outpatient Medications  Medication Sig Dispense Refill   cloNIDine (CATAPRES) 0.1 MG tablet Take 0.1 mg by mouth at bedtime.     [employer] SUNSCREEN SPF 30 EX [employer] progest cream once daily     cycloSPORINE (RESTASIS) 0.05 % ophthalmic emulsion Place 1 drop into both eyes 2 (two) times daily.     estradiol (CLIMARA - DOSED IN MG/24 HR) 0.025 mg/24hr patch Place 0.025 mg onto the skin once a week.      gabapentin (NEURONTIN) 100 MG capsule Take 300 mg by mouth 3 (three) times daily.     hydrOXYzine (ATARAX) 25 MG tablet 25 mg.     ibuprofen (ADVIL) 400 MG tablet Take 400 mg by mouth every 6 (six) hours as needed.     ibuprofen (ADVIL) 800 MG tablet Take 1 tablet (800 mg total) by mouth every 8 (eight) hours as needed. 30 tablet 0   Multiple Vitamin (MULTIVITAMIN WITH MINERALS) TABS tablet Take 1 tablet by mouth daily.     pantoprazole (PROTONIX) 40 MG tablet Take 1 tablet (40 mg total) by mouth daily. 30 tablet 1   Polyethyl Glycol-Propyl Glycol (SYSTANE OP) Place 2 drops into both eyes daily as needed (dry eyes).      valACYclovir (VALTREX) 1000 MG tablet Take 0.5 tablets by mouth 2 (two) times daily  as needed. Breakouts (Patient not taking: Reported on 07/13/2021)  0   zolpidem (AMBIEN CR) 12.5 MG CR tablet Take 12.5 mg by mouth at bedtime.     No current facility-administered medications for this visit.    PHYSICAL EXAMINATION: ECOG PERFORMANCE STATUS: {CHL ONC ECOG PS:[phone]}  There were no vitals filed for this visit. There were no vitals filed for this visit.  BREAST:*** No palpable masses or nodules in either right or left breasts. No palpable axillary supraclavicular or infraclavicular adenopathy no breast tenderness or nipple discharge. (exam performed in the presence of a chaperone)  LABORATORY DATA:  I have reviewed the data as listed    Latest Ref Rng & Units 11/05/2016   11:36 AM 01/14/2014    3:58 PM 07/23/2013    1:06 PM  CMP  Glucose 70 - 99 mg/dL 105  101  92   BUN 6 - 23 mg/dL '13  10  16   '$ Creatinine 0.40 - 1.20 mg/dL 0.84  0.9  0.8   Sodium 135 - 145 mEq/L 140  138  139   Potassium 3.5 - 5.1 mEq/L 3.7  4.2  3.6   Chloride 96 - 112 mEq/L 102  104  102   CO2 19 - 32 mEq/L '30  29  31   '$ Calcium 8.4 - 10.5 mg/dL 9.7  9.4  9.3  Total Protein 6.0 - 8.3 g/dL 7.4     Total Bilirubin 0.2 - 1.2 mg/dL 0.4     Alkaline Phos 39 - 117 U/L 65     AST 0 - 37 U/L 22     ALT 0 - 35 U/L 14       Lab Results  Component Value Date   WBC 5.3 11/05/2016   HGB 13.9 11/05/2016   HCT 41.6 11/05/2016   MCV 92.4 11/05/2016   PLT 308.0 11/05/2016   NEUTROABS 3.2 11/05/2016    ASSESSMENT & PLAN:  No problem-specific Assessment & Plan notes found for this encounter.    No orders of the defined types were placed in this encounter.  The patient has a good understanding of the overall plan. she agrees with it. she will call with any problems that may develop before the next visit here. Total time spent: 30 mins including face to face time and time spent for planning, charting and co-ordination of care   Suzzette Righter, [place] 10/06/21    I Gardiner Coins am scribing  for Dr. Lindi Adie  ***

## 2021-10-09 ENCOUNTER — Ambulatory Visit
Admission: RE | Admit: 2021-10-09 | Discharge: 2021-10-09 | Disposition: A | Discharge status: Home / Self Care | Payer: Medicare Other | Source: Ambulatory Visit | Attending: Radiation Oncology | Admitting: Radiation Oncology

## 2021-10-09 ENCOUNTER — Other Ambulatory Visit: Payer: Self-pay

## 2021-10-09 ENCOUNTER — Ambulatory Visit: Payer: Medicare Other

## 2021-10-09 ENCOUNTER — Encounter: Payer: Self-pay | Admitting: *Deleted

## 2021-10-09 ENCOUNTER — Inpatient Hospital Stay (HOSPITAL_BASED_OUTPATIENT_CLINIC_OR_DEPARTMENT_OTHER): Payer: Medicare Other | Admitting: Hematology and Oncology

## 2021-10-09 DIAGNOSIS — Z510 Encounter for antineoplastic radiation therapy: Secondary | ICD-10-CM | POA: Diagnosis not present

## 2021-10-09 DIAGNOSIS — D0511 Intraductal carcinoma in situ of right breast: Secondary | ICD-10-CM | POA: Insufficient documentation

## 2021-10-09 DIAGNOSIS — R5383 Other fatigue: Secondary | ICD-10-CM | POA: Insufficient documentation

## 2021-10-09 DIAGNOSIS — Z803 Family history of malignant neoplasm of breast: Secondary | ICD-10-CM | POA: Insufficient documentation

## 2021-10-09 DIAGNOSIS — Z801 Family history of malignant neoplasm of trachea, bronchus and lung: Secondary | ICD-10-CM | POA: Insufficient documentation

## 2021-10-09 DIAGNOSIS — Z87891 Personal history of nicotine dependence: Secondary | ICD-10-CM | POA: Insufficient documentation

## 2021-10-09 DIAGNOSIS — L589 Radiodermatitis, unspecified: Secondary | ICD-10-CM | POA: Insufficient documentation

## 2021-10-09 DIAGNOSIS — Z800 Family history of malignant neoplasm of digestive organs: Secondary | ICD-10-CM | POA: Insufficient documentation

## 2021-10-09 DIAGNOSIS — Z923 Personal history of irradiation: Secondary | ICD-10-CM | POA: Insufficient documentation

## 2021-10-09 LAB — RAD ONC ARIA SESSION SUMMARY
Course Elapsed Days: 46
Plan Fractions Treated to Date: 3
Plan Prescribed Dose Per Fraction: 2 Gy
Plan Total Fractions Prescribed: 5
Plan Total Prescribed Dose: 10 Gy
Reference Point Dosage Given to Date: 56.4 Gy
Reference Point Session Dosage Given: 2 Gy
Session Number: 31

## 2021-10-09 NOTE — Assessment & Plan Note (Addendum)
07/19/2021: Screening mammogram detected right breast indeterminate mass at 9 o'clock position 1.1 cm on biopsy was intermediate grade DCIS ER 100%, PR 100%, status post lumpectomy high-grade DCIS with cribriform pattern and comedonecrosis 1.2 cm, margins negative  Treatment plan: 1.  Adjuvant radiation therapy to be completed 10/11/2021 2.  recommended adjuvant antiestrogen therapy with tamoxifen x5 years: Patient refused   Return to clinic in 3 months for survivorship care plan visit and after that she could be seen in long-term survivorship once a year.

## 2021-10-10 ENCOUNTER — Ambulatory Visit: Payer: Medicare Other

## 2021-10-10 ENCOUNTER — Ambulatory Visit
Admission: RE | Admit: 2021-10-10 | Discharge: 2021-10-10 | Disposition: A | Discharge status: Home / Self Care | Payer: Medicare Other | Source: Ambulatory Visit | Attending: Radiation Oncology | Admitting: Radiation Oncology

## 2021-10-10 ENCOUNTER — Other Ambulatory Visit: Payer: Self-pay

## 2021-10-10 ENCOUNTER — Encounter: Payer: Self-pay | Admitting: Radiation Oncology

## 2021-10-10 DIAGNOSIS — Z510 Encounter for antineoplastic radiation therapy: Secondary | ICD-10-CM | POA: Diagnosis not present

## 2021-10-10 LAB — RAD ONC ARIA SESSION SUMMARY
Course Elapsed Days: 47
Plan Fractions Treated to Date: 4
Plan Prescribed Dose Per Fraction: 2 Gy
Plan Total Fractions Prescribed: 5
Plan Total Prescribed Dose: 10 Gy
Reference Point Dosage Given to Date: 58.4 Gy
Reference Point Session Dosage Given: 2 Gy
Session Number: 32

## 2021-10-10 NOTE — Progress Notes (Signed)
The patient is currently receiving radiation for her history of   High grade, ER/PR positive DCIS of the right breast. She has developed dermatitis at the lateral breast at the base of hte axilla, and has dry but flaky dermatitis. No desquamation is noted. We discussed OTC neosporin with pain relief and also gave her a jar of Silvadene in case her symptoms became more of a desquamation picture. She is in agreement to keep [place] aware of her course, and she will finish radiation tomorrow.

## 2021-10-11 ENCOUNTER — Other Ambulatory Visit: Payer: Self-pay

## 2021-10-11 ENCOUNTER — Encounter: Payer: Self-pay | Admitting: Radiation Oncology

## 2021-10-11 ENCOUNTER — Ambulatory Visit
Admission: RE | Admit: 2021-10-11 | Discharge: 2021-10-11 | Disposition: A | Discharge status: Home / Self Care | Payer: Medicare Other | Source: Ambulatory Visit | Attending: Radiation Oncology | Admitting: Radiation Oncology

## 2021-10-11 DIAGNOSIS — Z510 Encounter for antineoplastic radiation therapy: Secondary | ICD-10-CM | POA: Diagnosis not present

## 2021-10-11 LAB — RAD ONC ARIA SESSION SUMMARY
Course Elapsed Days: 48
Plan Fractions Treated to Date: 5
Plan Prescribed Dose Per Fraction: 2 Gy
Plan Total Fractions Prescribed: 5
Plan Total Prescribed Dose: 10 Gy
Reference Point Dosage Given to Date: 60.4 Gy
Reference Point Session Dosage Given: 2 Gy
Session Number: 33

## 2021-10-12 NOTE — Progress Notes (Signed)
                                                                                                                                                             Patient Name: [name] MRN: [id number] DOB: [birth year] Referring Physician: Josetta Huddle (Profile Not Attached) Date of Service: 10/11/2021 [facility], [place]                                                        End Of Treatment Note  Diagnoses: C50.911-Malignant neoplasm of unspecified site of right female breast  Cancer Staging: High grade, ER/PR positive DCIS of the right breast  Intent: Curative  Radiation Treatment Dates: 08/24/2021 through 10/11/2021 Site Technique Total Dose (Gy) Dose per Fx (Gy) Completed Fx Beam Energies  Breast, Right: Breast_R 3D 50.4/50.4 1.8 28/28 6XFFF  Breast, Right: Breast_R_Bst 3D 10/10 2 5/5 6X, 10X   Narrative: The patient tolerated radiation therapy relatively well. She developed fatigue and anticipated skin changes in the treatment field without desquamation.  Plan: The patient will receive a call in about one month from the radiation oncology department. She will continue follow up with Dr. Lindi Adie as well.   ________________________________________________    Carola Rhine, [facility]

## 2021-11-07 ENCOUNTER — Telehealth: Payer: Self-pay | Admitting: Gastroenterology

## 2021-11-07 NOTE — Telephone Encounter (Signed)
Patient called is ready to proceed with the MRI\MRCP

## 2021-11-07 NOTE — Telephone Encounter (Signed)
Scheduling number provided to patient.

## 2021-11-15 ENCOUNTER — Other Ambulatory Visit: Payer: Self-pay | Admitting: Obstetrics and Gynecology

## 2021-11-15 ENCOUNTER — Ambulatory Visit
Admission: RE | Admit: 2021-11-15 | Discharge: 2021-11-15 | Disposition: A | Discharge status: Home / Self Care | Payer: Medicare Other | Source: Ambulatory Visit | Attending: Obstetrics and Gynecology | Admitting: Obstetrics and Gynecology

## 2021-11-15 DIAGNOSIS — D0511 Intraductal carcinoma in situ of right breast: Secondary | ICD-10-CM

## 2021-11-15 DIAGNOSIS — N6320 Unspecified lump in the left breast, unspecified quadrant: Secondary | ICD-10-CM

## 2021-11-16 NOTE — Progress Notes (Signed)
  Radiation Oncology         ([phone] ________________________________  Name: [name] MRN: [id number]  Date of Service: 11/27/2021  DOB: Oct 24, 1950  Post Treatment Telephone Note  Diagnosis:   High grade, ER/PR positive DCIS of the right breast  Intent: Curative  Radiation Treatment Dates: 08/24/2021 through 10/11/2021 Site Technique Total Dose (Gy) Dose per Fx (Gy) Completed Fx Beam Energies  Breast, Right: Breast_R 3D 50.4/50.4 1.8 28/28 6XFFF  Breast, Right: Breast_R_Bst 3D 10/10 2 5/5 6X, 10X   Narrative: The patient tolerated radiation therapy relatively well. She developed fatigue and anticipated skin changes in the treatment field without desquamation. Her skin has cleared up nicely. She has a little bit of fatigue lingering.   Impression/Plan: 1. High grade, ER/PR positive DCIS of the right breast. The patient has been doing well since completion of radiotherapy. We discussed that we would be happy to continue to follow her as needed, but she will also continue to follow up with Dr. Lindi Adie in medical oncology. She was counseled on skin care as well as measures to avoid sun exposure to this area.  2. Survivorship. We discussed the importance of survivorship evaluation and encouraged her to attend her upcoming visit with that clinic.      Carola Rhine, PAC

## 2021-11-27 ENCOUNTER — Ambulatory Visit
Admission: RE | Admit: 2021-11-27 | Discharge: 2021-11-27 | Disposition: A | Discharge status: Home / Self Care | Payer: Medicare Other | Source: Ambulatory Visit | Attending: Radiation Oncology | Admitting: Radiation Oncology

## 2021-11-27 DIAGNOSIS — D0511 Intraductal carcinoma in situ of right breast: Secondary | ICD-10-CM

## 2021-12-15 ENCOUNTER — Ambulatory Visit (HOSPITAL_COMMUNITY)
Admission: RE | Admit: 2021-12-15 | Discharge: 2021-12-15 | Disposition: A | Discharge status: Home / Self Care | Payer: Medicare Other | Source: Ambulatory Visit | Attending: Gastroenterology | Admitting: Gastroenterology

## 2021-12-15 ENCOUNTER — Other Ambulatory Visit: Payer: Self-pay | Admitting: Gastroenterology

## 2021-12-15 DIAGNOSIS — Z1509 Genetic susceptibility to other malignant neoplasm: Secondary | ICD-10-CM | POA: Diagnosis present

## 2021-12-15 DIAGNOSIS — Z800 Family history of malignant neoplasm of digestive organs: Secondary | ICD-10-CM | POA: Diagnosis present

## 2021-12-15 DIAGNOSIS — Z129 Encounter for screening for malignant neoplasm, site unspecified: Secondary | ICD-10-CM

## 2021-12-15 DIAGNOSIS — Z1501 Genetic susceptibility to malignant neoplasm of breast: Secondary | ICD-10-CM | POA: Insufficient documentation

## 2021-12-15 MED ORDER — GADOPICLENOL 0.5 MMOL/ML IV SOLN
5.0000 mL | Freq: Once | INTRAVENOUS | Status: AC | PRN
  Administered 2021-12-15: 5 mL via INTRAVENOUS

## 2021-12-28 ENCOUNTER — Other Ambulatory Visit: Payer: Self-pay | Admitting: Obstetrics and Gynecology

## 2021-12-28 DIAGNOSIS — M8580 Other specified disorders of bone density and structure, unspecified site: Secondary | ICD-10-CM

## 2022-01-02 ENCOUNTER — Telehealth: Payer: Self-pay | Admitting: Adult Health

## 2022-01-02 NOTE — Telephone Encounter (Signed)
Rescheduled appointment per room/resource. Left voicemail. 

## 2022-01-10 ENCOUNTER — Encounter: Payer: Medicare Other | Admitting: Adult Health

## 2022-02-05 ENCOUNTER — Inpatient Hospital Stay: Payer: Medicare Other | Admitting: Adult Health

## 2022-03-16 ENCOUNTER — Encounter: Payer: Self-pay | Admitting: Adult Health

## 2022-03-16 ENCOUNTER — Inpatient Hospital Stay: Payer: Medicare Other | Attending: Adult Health | Admitting: Adult Health

## 2022-03-16 VITALS — BP 114/79 | HR 50 | Temp 97.5°F | Resp 18 | Ht 64.0 in | Wt 112.4 lb

## 2022-03-16 DIAGNOSIS — Z79810 Long term (current) use of selective estrogen receptor modulators (SERMs): Secondary | ICD-10-CM | POA: Insufficient documentation

## 2022-03-16 DIAGNOSIS — D0511 Intraductal carcinoma in situ of right breast: Secondary | ICD-10-CM | POA: Diagnosis not present

## 2022-03-16 DIAGNOSIS — Z87891 Personal history of nicotine dependence: Secondary | ICD-10-CM | POA: Diagnosis not present

## 2022-03-16 DIAGNOSIS — Z803 Family history of malignant neoplasm of breast: Secondary | ICD-10-CM | POA: Insufficient documentation

## 2022-03-16 DIAGNOSIS — Z800 Family history of malignant neoplasm of digestive organs: Secondary | ICD-10-CM | POA: Diagnosis not present

## 2022-03-16 DIAGNOSIS — Z801 Family history of malignant neoplasm of trachea, bronchus and lung: Secondary | ICD-10-CM | POA: Insufficient documentation

## 2022-03-16 NOTE — Progress Notes (Unsigned)
SURVIVORSHIP VISIT:    BRIEF ONCOLOGIC HISTORY:  Oncology History  Ductal carcinoma in situ (DCIS) of right breast  06/20/2021 Initial Diagnosis   Screening detected Right Breast indeterminate mass at 9 o'clock position: 1.1 cm biopsy revealed intermediate grade DCIS ER 100%, PR 100%   07/19/2021 Surgery   Right Lumpectomy: HG DCIS with cribriform pattern and comedonecrosis 1.2 cm, Margins Negative, ER 100%, PR 100%   08/24/2021 - 10/11/2021 Radiation Therapy   Site Technique Total Dose (Gy) Dose per Fx (Gy) Completed Fx Beam Energies  Breast, Right: Breast_R 3D 50.4/50.4 1.8 28/28 6XFFF  Breast, Right: Breast_R_Bst 3D 10/10 2 5/5 6X, 10X     10/2021 -  Anti-estrogen oral therapy   Tamoxifen x 5 years     INTERVAL HISTORY:  [name] to review her survivorship care plan detailing her treatment course for breast cancer, as well as monitoring long-term side effects of that treatment, education regarding health maintenance, screening, and overall wellness and health promotion.     Overall, [name] reports feeling quite well.  She never started taking Tamoxifen and is feeling well.    REVIEW OF SYSTEMS:  Review of Systems  Constitutional:  Negative for appetite change, chills, fatigue, fever and unexpected weight change.  HENT:   Negative for hearing loss, lump/mass and trouble swallowing.   Eyes:  Negative for eye problems and icterus.  Respiratory:  Negative for chest tightness, cough and shortness of breath.   Cardiovascular:  Negative for chest pain, leg swelling and palpitations.  Gastrointestinal:  Negative for abdominal distention, abdominal pain, constipation, diarrhea, nausea and vomiting.  Endocrine: Negative for hot flashes.  Genitourinary:  Negative for difficulty urinating.   Musculoskeletal:  Negative for arthralgias.  Skin:  Negative for itching and rash.  Neurological:  Negative for dizziness, extremity weakness, headaches and numbness.  Hematological:  Negative for  adenopathy. Does not bruise/bleed easily.  Psychiatric/Behavioral:  Negative for depression. The patient is not nervous/anxious.    Breast: Denies any new nodularity, masses, tenderness, nipple changes, or nipple discharge.     PAST MEDICAL/SURGICAL HISTORY:  Past Medical History:  Diagnosis Date   Anxiety    Arthritis    wrist    Atrial flutter with rapid ventricular response (HCC)    SVT per pt    Breast cancer ([place]) 05/2021   right breast DCIS   Bunion    Cataract    mild    Colon polyps    Dysrhythmia 07/2013   SVT   Enthesopathy of ankle and tarsus, unspecified    Esophageal reflux    Family history of breast cancer    Family history of lung cancer    Family history of malignant neoplasm of gastrointestinal tract    Family history of ovarian cancer    Hiatal hernia    Irritable bowel syndrome    Monoallelic mutation of IOXB3Z gene    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[place])    Other acquired calcaneus deformity    Personal history of colonic polyps 2002   hyperplastic   Personal history of malignant melanoma    Personal history of squamous cell carcinoma of skin    Plantar fascial fibromatosis    Rotator cuff syndrome of shoulder and allied disorders    Skin cancer    Syncope    Past Surgical History:  Procedure Laterality Date   ABLATION OF DYSRHYTHMIC FOCUS  07/31/2013   DR Lovena Le   ARTHROPLASTY  2006   thumb/wrist    AUGMENTATION MAMMAPLASTY Bilateral  BRAVO [place] STUDY  02/01/2011   Procedure: BRAVO [place] STUDY;  Surgeon: Inda Castle, MD;  Location: [facility];  Service: Endoscopy;  Laterality: N/A;   BREAST BIOPSY Left 09/2020   BREAST BIOPSY Right 06/20/2021   BREAST EXCISIONAL BIOPSY Left 10/2020   BREAST LUMPECTOMY WITH RADIOACTIVE SEED LOCALIZATION Left 11/03/2020   Procedure: LEFT BREAST LUMPECTOMY WITH RADIOACTIVE SEED LOCALIZATION;  Surgeon: Erroll Luna, MD;  Location: [place];  Service: General;  Laterality: Left;   BREAST LUMPECTOMY  WITH RADIOACTIVE SEED LOCALIZATION Right 07/19/2021   Procedure: RIGHT BREAST LUMPECTOMY WITH RADIOACTIVE SEED LOCALIZATION;  Surgeon: Erroll Luna, MD;  Location: [place];  Service: General;  Laterality: Right;   BUNIONECTOMY     CESAREAN SECTION     x3   COLONOSCOPY  07/16/2012   ESOPHAGOGASTRODUODENOSCOPY  02/01/2011   ESOPHAGOGASTRODUODENOSCOPY (EGD) WITH PROPOFOL N/A 12/12/2020   Procedure: ESOPHAGOGASTRODUODENOSCOPY (EGD) WITH PROPOFOL;  Surgeon: Irving Copas., MD;  Location: [facility];  Service: Gastroenterology;  Laterality: N/A;   EUS N/A 12/12/2020   Procedure: UPPER ENDOSCOPIC ULTRASOUND (EUS) RADIAL;  Surgeon: Irving Copas., MD;  Location: [facility];  Service: Gastroenterology;  Laterality: N/A;   PARTIAL HYSTERECTOMY     Still has ovaries   PLACEMENT OF BREAST IMPLANTS     POLYPECTOMY     SAVORY DILATION N/A 12/12/2020   Procedure: SAVORY DILATION;  Surgeon: Rush Landmark Telford Nab., MD;  Location: [facility];  Service: Gastroenterology;  Laterality: N/A;   SHOULDER SURGERY     SUPRAVENTRICULAR TACHYCARDIA ABLATION N/A 07/31/2013   Procedure: SUPRAVENTRICULAR TACHYCARDIA ABLATION;  Surgeon: Evans Lance, MD;  Location: [facility];  Service: Cardiovascular;  Laterality: N/A;     ALLERGIES:  No Known Allergies   CURRENT MEDICATIONS:  Outpatient Encounter Medications as of 03/16/2022  Medication Sig   cloNIDine (CATAPRES) 0.1 MG tablet Take 0.1 mg by mouth at bedtime.   [employer] SUNSCREEN SPF 30 EX [employer] progest cream once daily   cycloSPORINE (RESTASIS) 0.05 % ophthalmic emulsion Place 1 drop into both eyes 2 (two) times daily.   estradiol (CLIMARA - DOSED IN MG/24 HR) 0.025 mg/24hr patch Place 0.025 mg onto the skin once a week.    gabapentin (NEURONTIN) 100 MG capsule Take 300 mg by mouth 3 (three) times daily.   hydrOXYzine (ATARAX) 25 MG tablet 25 mg.   ibuprofen (ADVIL) 400 MG tablet Take 400 mg by mouth every 6 (six)  hours as needed.   Multiple Vitamin (MULTIVITAMIN WITH MINERALS) TABS tablet Take 1 tablet by mouth daily.   pantoprazole (PROTONIX) 40 MG tablet Take 1 tablet (40 mg total) by mouth daily.   Polyethyl Glycol-Propyl Glycol (SYSTANE OP) Place 2 drops into both eyes daily as needed (dry eyes).    valACYclovir (VALTREX) 1000 MG tablet Take 0.5 tablets by mouth 2 (two) times daily as needed. Breakouts (Patient not taking: Reported on 07/13/2021)   zolpidem (AMBIEN CR) 12.5 MG CR tablet Take 12.5 mg by mouth at bedtime.   No facility-administered encounter medications on file as of 03/16/2022.     ONCOLOGIC FAMILY HISTORY:  Family History  Problem Relation Age of Onset   Colon cancer Father 28   Heart disease Mother    Breast cancer Maternal Aunt        dx 73s   Lung cancer Maternal Aunt        dx 35s, smoker   Ovarian cancer Cousin        dx 31s,  maternal first cousin   Lung cancer Maternal Grandfather        dx 44s, smoker   Colon cancer Maternal Uncle        dx 63s   Lung cancer Other        MGF's brother   Colon cancer Nephew 65   Dementia Maternal Aunt    Breast cancer Cousin        dx 16s, maternal first cousin   Ovarian cancer Cousin 44       dysgerminoma, maternal first cousin   Lung cancer Other        MGF's brother   Colon polyps Neg Hx    Esophageal cancer Neg Hx    Rectal cancer Neg Hx    Stomach cancer Neg Hx     SOCIAL HISTORY:  Social History   Socioeconomic History   Marital status: Divorced    Spouse name: Not on file   Number of children: 3   Years of education: Not on file   Highest education level: Not on file  Occupational History   Occupation: Careers information officer    Employer: [employer]  Tobacco Use   Smoking status: Former    Types: Cigarettes    Quit date: 03/19/1978    Years since quitting: 44.0   Smokeless tobacco: Never  Vaping Use   Vaping Use: Never used  Substance and Sexual Activity   Alcohol use: Not Currently     Comment: rarely- twice a yr if that    Drug use: No   Sexual activity: Not Currently    Birth control/protection: Surgical    Comment: partial hyst  Other Topics Concern   Not on file  Social History Narrative   Not on file   Social Determinants of Health   Financial Resource Strain: Not on file  Food Insecurity: Not on file  Transportation Needs: Not on file  Physical Activity: Not on file  Stress: Not on file  Social Connections: Not on file  Intimate Partner Violence: Not on file     OBSERVATIONS/OBJECTIVE:  BP 114/79 (BP Location: Left Arm, Patient Position: Sitting)   Pulse (!) 50   Temp (!) 97.5 F (36.4 C) (Tympanic)   Resp 18   Ht '5\' 4"'$  (1.626 m)   Wt 112 lb 6.4 oz (51 kg)   SpO2 99%   BMI 19.29 kg/m  GENERAL: Patient is a well appearing female in no acute distress HEENT:  Sclerae anicteric.  Oropharynx clear and moist. No ulcerations or evidence of oropharyngeal candidiasis. Neck is supple.  NODES:  No cervical, supraclavicular, or axillary lymphadenopathy palpated.  BREAST EXAM: Right breast status postlumpectomy and radiation no sign of local recurrence left breast is benign. LUNGS:  Clear to auscultation bilaterally.  No wheezes or rhonchi. HEART:  Regular rate and rhythm. No murmur appreciated. ABDOMEN:  Soft, nontender.  Positive, normoactive bowel sounds. No organomegaly palpated. MSK:  No focal spinal tenderness to palpation. Full range of motion bilaterally in the upper extremities. EXTREMITIES:  No peripheral edema.   SKIN:  Clear with no obvious rashes or skin changes. No nail dyscrasia. NEURO:  Nonfocal. Well oriented.  Appropriate affect.  LABORATORY DATA:  None for this visit.  DIAGNOSTIC IMAGING:  None for this visit.      ASSESSMENT AND PLAN:  Ms.. [name] is a pleasant 71 y.o. female with Stage 0 right breast DCIS, ER+/PR+, diagnosed in 06/2022, treated with lumpectomy, adjuvant radiation therapy, and anti-estrogen therapy with Tamoxifen  beginning in 10/2021.  She presents to the Survivorship Clinic for our initial meeting and routine follow-up post-completion of treatment for breast cancer.    1. Stage 0 right breast cancer:  Ms. Pepperman is continuing to recover from definitive treatment for breast cancer. She will follow-up with her medical oncologist, Dr. Lindi Adie in 6 months with history and physical exam per surveillance protocol.  She will continue her anti-estrogen therapy with Tamoxifen. Thus far, she is tolerating the Tamoxifen well, with minimal side effects. She was instructed to make Dr. Lindi Adie or myself aware if she begins to experience any worsening side effects of the medication and I could see her back in clinic to help manage those side effects, as needed. Her mammogram is due 04/2022; orders placed today. Today, a comprehensive survivorship care plan and treatment summary was reviewed with the patient today detailing her breast cancer diagnosis, treatment course, potential late/long-term effects of treatment, appropriate follow-up care with recommendations for the future, and patient education resources.  A copy of this summary, along with a letter will be sent to the patient's primary care provider via mail/fax/In Basket message after today's visit.    #. Problem(s) at Visit______________  #. Bone health: She was given education on specific activities to promote bone health.  #. Cancer screening:  Due to Ms. Hagenow's history and her age, she should receive screening for skin cancers (every 6 months due to CDK2NA mutation), pancreatic cancer (due to CDK2NA mutation), colon cancer, and gynecologic cancers.  The information and recommendations are listed on the patient's comprehensive care plan/treatment summary and were reviewed in detail with the patient.    #. Health maintenance and wellness promotion: Ms. Younes was encouraged to consume 5-7 servings of fruits and vegetables per day. We reviewed the "Nutrition Rainbow"  handout.  She was also encouraged to engage in moderate to vigorous exercise for 30 minutes per day most days of the week. We discussed the [employer] fitness program, which is designed for cancer survivors to help them become more physically fit after cancer treatments.  She was instructed to limit her alcohol consumption and continue to abstain from tobacco use/***was encouraged stop smoking.     #. Support services/counseling: It is not uncommon for this period of the patient's cancer care trajectory to be one of many emotions and stressors.  She was given information regarding our available services and encouraged to contact me with any questions or for help enrolling in any of our support group/programs.    Follow up instructions:    -Return to cancer center ***  -Mammogram due in *** -Follow up with surgery *** -She is welcome to return back to the Survivorship Clinic at any time; no additional follow-up needed at this time.  -Consider referral back to survivorship as a long-term survivor for continued surveillance  The patient was provided an opportunity to ask questions and all were answered. The patient agreed with the plan and demonstrated an understanding of the instructions.   Total encounter time:*** minutes*in face-to-face visit time, chart review, lab review, care coordination, order entry, and documentation of the encounter time.    Wilber Bihari, NP 03/16/22 2:22 PM Medical Oncology and Hematology [facility] [place], [place] [address] Tel. [phone]    Fax. [phone]  *Total Encounter Time as defined by the Centers for Medicare and Medicaid Services includes, in addition to the face-to-face time of a patient visit (documented in the note above) non-face-to-face time: obtaining and  reviewing outside history, ordering and reviewing medications, tests or procedures, care coordination (communications with other health care professionals  or caregivers) and documentation in the medical record.

## 2022-03-16 NOTE — Progress Notes (Unsigned)
Called and LVM with [facility] asking for medical records. Gave fax number and call back number with any questions.

## 2022-05-04 ENCOUNTER — Other Ambulatory Visit: Payer: Self-pay | Admitting: Obstetrics and Gynecology

## 2022-05-04 DIAGNOSIS — D0511 Intraductal carcinoma in situ of right breast: Secondary | ICD-10-CM

## 2022-05-04 DIAGNOSIS — R2231 Localized swelling, mass and lump, right upper limb: Secondary | ICD-10-CM

## 2022-05-17 ENCOUNTER — Other Ambulatory Visit: Payer: Self-pay | Admitting: Obstetrics and Gynecology

## 2022-05-17 ENCOUNTER — Ambulatory Visit
Admission: RE | Admit: 2022-05-17 | Discharge: 2022-05-17 | Disposition: A | Discharge status: Home / Self Care | Payer: Medicare Other | Source: Ambulatory Visit | Attending: Obstetrics and Gynecology | Admitting: Obstetrics and Gynecology

## 2022-05-17 DIAGNOSIS — R2231 Localized swelling, mass and lump, right upper limb: Secondary | ICD-10-CM

## 2022-05-17 DIAGNOSIS — D0511 Intraductal carcinoma in situ of right breast: Secondary | ICD-10-CM

## 2022-05-17 DIAGNOSIS — N6310 Unspecified lump in the right breast, unspecified quadrant: Secondary | ICD-10-CM

## 2022-05-17 HISTORY — DX: Personal history of irradiation: Z92.3

## 2022-05-22 ENCOUNTER — Ambulatory Visit
Admission: RE | Admit: 2022-05-22 | Discharge: 2022-05-22 | Disposition: A | Discharge status: Home / Self Care | Payer: Medicare Other | Source: Ambulatory Visit | Attending: Obstetrics and Gynecology | Admitting: Obstetrics and Gynecology

## 2022-05-22 DIAGNOSIS — N6310 Unspecified lump in the right breast, unspecified quadrant: Secondary | ICD-10-CM

## 2022-05-22 HISTORY — PX: BREAST BIOPSY: SHX20

## 2022-06-21 ENCOUNTER — Ambulatory Visit
Admission: RE | Admit: 2022-06-21 | Discharge: 2022-06-21 | Disposition: A | Discharge status: Home / Self Care | Payer: Medicare Other | Source: Ambulatory Visit | Attending: Obstetrics and Gynecology | Admitting: Obstetrics and Gynecology

## 2022-06-21 DIAGNOSIS — M8580 Other specified disorders of bone density and structure, unspecified site: Secondary | ICD-10-CM

## 2022-06-24 ENCOUNTER — Emergency Department (HOSPITAL_BASED_OUTPATIENT_CLINIC_OR_DEPARTMENT_OTHER): Payer: Medicare Other | Admitting: Radiology

## 2022-06-24 ENCOUNTER — Emergency Department (HOSPITAL_BASED_OUTPATIENT_CLINIC_OR_DEPARTMENT_OTHER)
Admission: EM | Admit: 2022-06-24 | Discharge: 2022-06-24 | Disposition: A | Discharge status: Home / Self Care | Payer: Medicare Other | Attending: Emergency Medicine | Admitting: Emergency Medicine

## 2022-06-24 DIAGNOSIS — R42 Dizziness and giddiness: Secondary | ICD-10-CM | POA: Diagnosis present

## 2022-06-24 DIAGNOSIS — R002 Palpitations: Secondary | ICD-10-CM | POA: Insufficient documentation

## 2022-06-24 LAB — CBC
HCT: 39.6 % (ref 36.0–46.0)
Hemoglobin: 13.5 g/dL (ref 12.0–15.0)
MCH: 30.8 pg (ref 26.0–34.0)
MCHC: 34.1 g/dL (ref 30.0–36.0)
MCV: 90.4 fL (ref 80.0–100.0)
Platelets: 323 10*3/uL (ref 150–400)
RBC: 4.38 MIL/uL (ref 3.87–5.11)
RDW: 12.6 % (ref 11.5–15.5)
WBC: 4.4 10*3/uL (ref 4.0–10.5)
nRBC: 0 % (ref 0.0–0.2)

## 2022-06-24 LAB — URINALYSIS, ROUTINE W REFLEX MICROSCOPIC
Bilirubin Urine: NEGATIVE
Glucose, UA: NEGATIVE mg/dL
Hgb urine dipstick: NEGATIVE
Ketones, ur: NEGATIVE mg/dL
Leukocytes,Ua: NEGATIVE
Nitrite: NEGATIVE
Protein, ur: NEGATIVE mg/dL
Specific Gravity, Urine: 1.005 (ref 1.005–1.030)
pH: 6 (ref 5.0–8.0)

## 2022-06-24 LAB — COMPREHENSIVE METABOLIC PANEL
ALT: 15 U/L (ref 0–44)
AST: 23 U/L (ref 15–41)
Albumin: 4.4 g/dL (ref 3.5–5.0)
Alkaline Phosphatase: 58 U/L (ref 38–126)
Anion gap: 10 (ref 5–15)
BUN: 17 mg/dL (ref 8–23)
CO2: 26 mmol/L (ref 22–32)
Calcium: 9.5 mg/dL (ref 8.9–10.3)
Chloride: 103 mmol/L (ref 98–111)
Creatinine, Ser: 0.76 mg/dL (ref 0.44–1.00)
GFR, Estimated: >60 mL/min (ref >60)
Glucose, Bld: 106 mg/dL — ABNORMAL HIGH (ref 70–99)
Potassium: 3.9 mmol/L (ref 3.5–5.1)
Sodium: 139 mmol/L (ref 135–145)
Total Bilirubin: 0.3 mg/dL (ref 0.3–1.2)
Total Protein: 6.6 g/dL (ref 6.5–8.1)

## 2022-06-24 LAB — TROPONIN I (HIGH SENSITIVITY): Troponin I (High Sensitivity): 2 ng/L (ref <18)

## 2022-06-24 LAB — TSH: TSH: 0.552 u[IU]/mL (ref 0.350–4.500)

## 2022-06-24 NOTE — ED Triage Notes (Signed)
Pt c/o "feeling" weird with intermittent mild dizziness for a few weeks, and checked her heart rate last night during such a spell and found her heart rate to be 42.  Pt decided to sleep instead of come to ED and awoke at 130 and could not return to sleep.  Pt continues to "feel funny" since. Pt is a regular runner and did run 3 miles this morning with no issue.

## 2022-06-24 NOTE — Discharge Instructions (Signed)
Today your labs are really reassuring, your blood pressure is also within normal limits.  Make sure you are drinking lots of fluids, and resting.  You should follow-up with your cardiologist, for further evaluation, you may need to have a Zio patch placed to check for abdomen normal heart rate/heartbeat.  Return to the ER if you have chest pain, shortness of breath, dizziness, confusion, weakness on one side of the body.

## 2022-06-24 NOTE — ED Provider Notes (Signed)
[name] Provider Note   CSN: [id number] Arrival date & time: 06/24/22  1358     History  Chief Complaint  Patient presents with   Dizziness    [name] is a 72 y.o. female, hx of SVT s/p ablation, who presents to the ED secondary to feeling kind of odd, and little bit dizzy for the last couple weeks.  She states it is worse when she stands up, and she gets a little bit more dizzy.  States she became concerned because yesterday she was very fatigued, and she noticed her heart rate was in the 40s when she checked it.  Slept last night, and then woke up at 130 this morning, could not sleep.  States that she just feels very fatigued, and has started having some left-sided arm pain since this a.m.  She states is achy not worse with movement.  Denies any chest pain, SOB, abdominal pain, nausea or vomiting.  Endorses some recent hot flashes and chills. Feels like her heart races occasionally and then slows down.   Home Medications Prior to Admission medications   Medication Sig Start Date End Date Taking? Authorizing Provider  cloNIDine (CATAPRES) 0.1 MG tablet Take 0.1 mg by mouth at bedtime.    [provider]  [employer]S SUNSCREEN SPF 30 EX [employer] progest cream once daily    [provider]  cycloSPORINE (RESTASIS) 0.05 % ophthalmic emulsion Place 1 drop into both eyes 2 (two) times daily.    [provider]  gabapentin (NEURONTIN) 100 MG capsule Take 300 mg by mouth 3 (three) times daily. 11/16/20   [provider]  hydrOXYzine (ATARAX) 25 MG tablet 25 mg.    [provider]  ibuprofen (ADVIL) 400 MG tablet Take 400 mg by mouth every 6 (six) hours as needed.    [provider]  Multiple Vitamin (MULTIVITAMIN WITH MINERALS) TABS tablet Take 1 tablet by mouth daily.    [provider]  pantoprazole (PROTONIX) 40 MG tablet Take 1 tablet (40 mg total) by mouth daily. 07/12/21   [name],  Caitlin T, MD  Polyethyl Glycol-Propyl Glycol (SYSTANE OP) Place 2 drops into both eyes daily as needed (dry eyes).     [provider]  valACYclovir (VALTREX) 1000 MG tablet Take 0.5 tablets by mouth 2 (two) times daily as needed. Breakouts 03/11/14   [provider]  zolpidem (AMBIEN CR) 12.5 MG CR tablet Take 12.5 mg by mouth at bedtime.    [provider]      Allergies    Patient has no known allergies.    Review of Systems   Review of Systems  Constitutional:  Positive for chills.  Respiratory:  Negative for shortness of breath.   Cardiovascular:  Negative for chest pain.  Neurological:  Positive for dizziness.    Physical Exam Updated Vital Signs BP 121/79 (BP Location: Right Arm)   Pulse (!) 59   Temp 98.4 F (36.9 C)   Resp 18   SpO2 100%  Physical Exam Vitals and nursing note reviewed.  Constitutional:      General: She is not in acute distress.    Appearance: She is well-developed.  HENT:     Head: Normocephalic and atraumatic.  Eyes:     Conjunctiva/sclera: Conjunctivae normal.  Cardiovascular:     Rate and Rhythm: Normal rate and regular rhythm.     Heart sounds: No murmur heard. Pulmonary:     Effort: Pulmonary effort  is normal. No respiratory distress.     Breath sounds: Normal breath sounds.  Abdominal:     Palpations: Abdomen is soft.     Tenderness: There is no abdominal tenderness.  Musculoskeletal:        General: No swelling.     Cervical back: Neck supple.  Skin:    General: Skin is warm and dry.     Capillary Refill: Capillary refill takes less than 2 seconds.  Neurological:     Mental Status: She is alert.  Psychiatric:        Mood and Affect: Mood normal.     ED Results / Procedures / Treatments   Labs (all labs ordered are listed, but only abnormal results are displayed) Labs Reviewed  COMPREHENSIVE METABOLIC PANEL - Abnormal; Notable for the following components:      Result Value   Glucose, Bld 106  (*)    All other components within normal limits  CBC  URINALYSIS, ROUTINE W REFLEX MICROSCOPIC  TSH  TROPONIN I (HIGH SENSITIVITY)  TROPONIN I (HIGH SENSITIVITY)    EKG EKG Interpretation  Date/Time:  Sunday June 24 2022 14:10:38 EDT Ventricular Rate:  57 PR Interval:  118 QRS Duration: 90 QT Interval:  422 QTC Calculation: 410 R Axis:   55 Text Interpretation: Sinus bradycardia Otherwise normal ECG No significant change since last tracing Confirmed by Melene Plan [phone]) on 06/24/2022 2:18:52 PM  Radiology DG Chest 2 View  Result Date: 06/24/2022 CLINICAL DATA:  Palpitations, dizziness and left arm pain. EXAM: CHEST - 2 VIEW COMPARISON:  05/05/2012.  Abdomen and pelvis CT dated 01/28/2014. FINDINGS: Normal sized heart. Tortuous aorta. Interval bullous changes or herniated gas filled bowel at the left lung base laterally. Clear lungs with normal vascularity. Mild thoracic spine degenerative changes. Bilateral breast implants. IMPRESSION: No active cardiopulmonary disease. Electronically Signed   By: Beckie Salts M.D.   On: 06/24/2022 15:51    Procedures Procedures    Medications Ordered in ED Medications - No data to display  ED Course/ Medical Decision Making/ A&P                             Medical Decision Making Patient is a 72 year old female, here for episodic dizziness, worse when she is standing.  We will obtain orthostatics.  Additionally complains of some palpitation we will obtain EKG, troponins given the left arm pain.  And TSH given the hot cold chills.  Overall well-appearing, does not appear to be in any acute distress.  Amount and/or Complexity of Data Reviewed Labs: ordered.    Details: No acute findings, normal troponin. Radiology: ordered.    Details: Chest x-ray of clear ECG/medicine tests:     Details: Sinus bradycardia Discussion of management or test interpretation with external provider(s): Discussed with patient, possible arrhythmia, however she  is in sinus bradycardia here, given that she is very active, I am not surprised by her pulse rate in the high 50s.  She is overall well-appearing troponins are negative, encouraged to follow-up with TSH, results with her primary care doctor as this will not be completed today.  We discussed return precautions, and sitting on the side of the bed, when rising to prevent dizziness.  Her orthostatics are within normal limits.  Discussed return precautions patient in agreement with plan.    Final Clinical Impression(s) / ED Diagnoses Final diagnoses:  Dizziness  Palpitations    Rx / DC Orders ED  Discharge Orders     None         Pete Pelt, [place] 06/24/22 1704    Cathren Laine, MD 06/24/22 1819

## 2022-07-18 ENCOUNTER — Encounter: Payer: Self-pay | Admitting: Gastroenterology

## 2022-08-06 ENCOUNTER — Telehealth: Payer: Self-pay | Admitting: Gastroenterology

## 2022-08-06 NOTE — Telephone Encounter (Signed)
The pt is due for EUS and colon. Dr Meridee Score and Russella Dar please advise.  Dr Meridee Score will you do both??

## 2022-08-06 NOTE — Telephone Encounter (Signed)
Patient called to schedule her recall colonoscopy with Dr. Russella Dar. She is also over due for a endoscopy with Dr. Meridee Score. She is requesting to have these procedure done together if possible. Requesting a call back to have this scheduled. Please advise, thank you.

## 2022-08-06 NOTE — Telephone Encounter (Signed)
Patty, Okay to try to offer both EUS and colonoscopy in same session.  75 to 90-minute case is fine. Thanks. GM

## 2022-08-07 ENCOUNTER — Other Ambulatory Visit: Payer: Self-pay

## 2022-08-07 DIAGNOSIS — Z1211 Encounter for screening for malignant neoplasm of colon: Secondary | ICD-10-CM

## 2022-08-07 DIAGNOSIS — R1310 Dysphagia, unspecified: Secondary | ICD-10-CM

## 2022-08-07 DIAGNOSIS — Z800 Family history of malignant neoplasm of digestive organs: Secondary | ICD-10-CM

## 2022-08-07 DIAGNOSIS — K219 Gastro-esophageal reflux disease without esophagitis: Secondary | ICD-10-CM

## 2022-08-07 MED ORDER — NA SULFATE-K SULFATE-MG SULF 17.5-3.13-1.6 GM/177ML PO SOLN: 1.0000 | Freq: Once | ORAL | 0 refills | Status: DC

## 2022-08-07 NOTE — Telephone Encounter (Signed)
EUS colon has been scheduled for 10/10/22 at 10 am with GM at [facility]   Left message on machine to call back

## 2022-08-08 NOTE — Telephone Encounter (Signed)
EUS scheduled, pt instructed and medications reviewed.  Patient instructions mailed to home and sent to My Chart .  Patient to call with any questions or concerns.  

## 2022-08-15 ENCOUNTER — Other Ambulatory Visit: Payer: Self-pay

## 2022-08-15 ENCOUNTER — Telehealth: Payer: Self-pay

## 2022-08-15 MED ORDER — NA SULFATE-K SULFATE-MG SULF 17.5-3.13-1.6 GM/177ML PO SOLN: 1.0000 | Freq: Once | ORAL | 0 refills | Status: AC

## 2022-08-15 NOTE — Telephone Encounter (Signed)
Received a vm from patient stating that the prep that was called in to her pharmacy was over $80. Pt requesting that prescription be sent to a different pharmacy if cheaper or alternative prep sent altogether. Thanks

## 2022-08-15 NOTE — Telephone Encounter (Signed)
Spoke with the pt and she will use [facility] and pick up the prescription at [facility]. Prescription has been sent to the [facility] location.

## 2022-09-10 ENCOUNTER — Telehealth: Payer: Self-pay | Admitting: *Deleted

## 2022-09-10 NOTE — Telephone Encounter (Signed)
Patient has a scheduled appointment with Dr. Eldridge Dace on 6/27 for clearance. I will fax updates to requesting provider's office.

## 2022-09-10 NOTE — Telephone Encounter (Signed)
   Pre-operative Risk Assessment    Patient Name: [name]  DOB: 03-18-1951 MRN: [id number]   PT LAST SEEN BY CARDIOLOGY 06/2019. PT WILL NEED NEW PT APPT. WILL REACH OUT TO OUR CHART PREP AND SCHEDULING TEAM.    Request for Surgical Clearance    Procedure:   VITRECTOMY  LEFT EYE  Date of Surgery:  Clearance 09/27/22                                 Surgeon:  DR. Stephannie Li Surgeon's Group or Practice Name:  [facility] Phone number:  [phone] Fax number:  [phone]   Type of Clearance Requested:   - Medical ; ASA    Type of Anesthesia:  MAC   Additional requests/questions:    Elpidio Anis   09/10/2022, 8:22 AM

## 2022-09-10 NOTE — Telephone Encounter (Signed)
    Primary Cardiologist:None  Chart reviewed as part of pre-operative protocol coverage. Because of [name]'s past medical history and time since last visit, he/she will require a follow-up visit in order to better assess preoperative cardiovascular risk.  Pre-op covering staff: - Please schedule Office appointment and call patient to inform them. - Please contact requesting surgeon's office via preferred method (i.e, phone, fax) to inform them of need for appointment prior to surgery.  If applicable, this message will also be routed to pharmacy pool and/or primary cardiologist for input on holding anticoagulant/antiplatelet agent as requested below so that this information is available at time of patient's appointment.   Ronney Asters, NP  09/10/2022, 8:44 AM

## 2022-09-13 ENCOUNTER — Ambulatory Visit: Payer: Medicare Other | Attending: Interventional Cardiology | Admitting: Interventional Cardiology

## 2022-09-13 ENCOUNTER — Encounter: Payer: Self-pay | Admitting: Interventional Cardiology

## 2022-09-13 VITALS — BP 112/68 | HR 68 | Ht 64.0 in | Wt 111.4 lb

## 2022-09-13 DIAGNOSIS — I471 Supraventricular tachycardia, unspecified: Secondary | ICD-10-CM | POA: Insufficient documentation

## 2022-09-13 DIAGNOSIS — Z01810 Encounter for preprocedural cardiovascular examination: Secondary | ICD-10-CM | POA: Diagnosis not present

## 2022-09-13 DIAGNOSIS — I480 Paroxysmal atrial fibrillation: Secondary | ICD-10-CM | POA: Diagnosis not present

## 2022-09-13 MED ORDER — APIXABAN 5 MG PO TABS: 5.0000 mg | ORAL_TABLET | Freq: Two times a day (BID) | ORAL | 0 refills | Status: DC

## 2022-09-13 MED ORDER — APIXABAN 5 MG PO TABS: 5.0000 mg | ORAL_TABLET | Freq: Two times a day (BID) | ORAL | 6 refills | Status: DC

## 2022-09-13 NOTE — Patient Instructions (Addendum)
Medication Instructions:  Your physician has recommended you make the following change in your medication: Start Eliquis 5 mg by mouth twice daily Stop Ibuprofen.  Can take acetaminophen as needed for pain  *If you need a refill on your cardiac medications before your next appointment, please call your pharmacy*   Lab Work: none If you have labs (blood work) drawn today and your tests are completely normal, you will receive your results only by: MyChart Message (if you have MyChart) OR A paper copy in the mail If you have any lab test that is abnormal or we need to change your treatment, we will call you to review the results.   Testing/Procedures: Your physician has requested that you have an echocardiogram. Echocardiography is a painless test that uses sound waves to create images of your heart. It provides your doctor with information about the size and shape of your heart and how well your heart's chambers and valves are working. This procedure takes approximately one hour. There are no restrictions for this procedure. Please do NOT wear cologne, perfume, aftershave, or lotions (deodorant is allowed). Please arrive 15 minutes prior to your appointment time. Please schedule as soon as possible  (surgical clearance)   Dr Eldridge Dace recommends you have a Calcium Score CT scan   Follow-Up: At [facility], you and your health needs are our priority.  As part of our continuing mission to provide you with exceptional heart care, we have created designated Provider Care Teams.  These Care Teams include your primary Cardiologist (physician) and Advanced Practice Providers (APPs -  Physician Assistants and Nurse Practitioners) who all work together to provide you with the care you need, when you need it.  We recommend signing up for the patient portal called "MyChart".  Sign up information is provided on this After Visit Summary.  MyChart is used to connect with patients for Virtual  Visits (Telemedicine).  Patients are able to view lab/test results, encounter notes, upcoming appointments, etc.  Non-urgent messages can be sent to your provider as well.   To learn more about what you can do with MyChart, go to [email or web address].    Your next appointment:   3 months  Provider:   Lance Muss, MD   or APP  Other Instructions

## 2022-09-13 NOTE — Progress Notes (Signed)
Cardiology Office Note   Date:  09/13/2022   ID:  [name] May 19, 1950, MRN [id number]  PCP:  Thana Ates, MD    No chief complaint on file.    Wt Readings from Last 3 Encounters:  09/13/22 111 lb 6.4 oz (50.5 kg)  03/16/22 112 lb 6.4 oz (51 kg)  10/09/21 117 lb 11.2 oz (53.4 kg)       History of Present Illness: [name] is a 72 y.o. female who is being seen today for the evaluation of preoperative clearance at the request of Thana Ates, MD.   H/o SVT and ablation with Dr Ladona Ridgel in 2015.  Monitor in 2020 showed: "she wore a cardiac monitor which demonstrated PVC's and PAC's, and NS atrial tachycardia. "  She was seen in the emergency room in April 2024: " hx of SVT s/p ablation, who presents to the ED secondary to feeling kind of odd, and little bit dizzy for the last couple weeks.  She states it is worse when she stands up, and she gets a little bit more dizzy.  States she became concerned because yesterday she was very fatigued, and she noticed her heart rate was in the 40s when she checked it.  Slept last night, and then woke up at 130 this morning, could not sleep.  States that she just feels very fatigued, and has started having some left-sided arm pain since this a.m.  She states is achy not worse with movement.  Denies any chest pain, SOB, abdominal pain, nausea or vomiting.  Endorses some recent hot flashes and chills. Feels like her heart races occasionally and then slows down. "  "she is in sinus bradycardia here, given that she is very active, I am not surprised by her pulse rate in the high 50s. She is overall well-appearing troponins are negative, "  Denies : Chest pain. Dizziness. Leg edema. Nitroglycerin use. Orthopnea. Palpitations. Paroxysmal nocturnal dyspnea. Shortness of breath. Syncope.   Runs several times a week.  Walks up stairs.  Very active and no cardiac sx.    Outpatient vitrectomy planned.     Has felt some palpitations.   AppleWatch has given a message of "poor reading."  Denies : Chest pain. Dizziness. Leg edema. Nitroglycerin use. Orthopnea.  Paroxysmal nocturnal dyspnea. Shortness of breath. Syncope.    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   Bunion    Cataract    mild    Colon polyps    Dysrhythmia 07/2013   SVT   Enthesopathy of ankle and tarsus, unspecified    Esophageal reflux    Family history of breast cancer    Family history of lung cancer    Family history of malignant neoplasm of gastrointestinal tract    Family history of ovarian cancer    Hiatal hernia    Irritable bowel syndrome    Monoallelic mutation of CDKN2A gene    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HCC)    Other acquired calcaneus deformity    Personal history of colonic polyps 2002   hyperplastic   Personal history of malignant melanoma    Personal history of radiation therapy    Personal history of squamous cell carcinoma of skin    Plantar fascial fibromatosis  Rotator cuff syndrome of shoulder and allied disorders    Skin cancer    Syncope     Past Surgical History:  Procedure Laterality Date   ABLATION OF DYSRHYTHMIC FOCUS  07/31/2013   DR Ladona Ridgel   ARTHROPLASTY  2006   thumb/wrist    AUGMENTATION MAMMAPLASTY Bilateral    BRAVO PH STUDY  02/01/2011   Procedure: BRAVO PH STUDY;  Surgeon: Louis Meckel, MD;  Location: [facility];  Service: Endoscopy;  Laterality: N/A;   BREAST BIOPSY Left 09/2020   2023   BREAST BIOPSY Right 06/20/2021   BREAST BIOPSY Right 05/22/2022   [place] RT BREAST BX W LOC DEV 1ST LESION IMG BX SPEC US GUIDE 05/22/2022 [facility]   BREAST EXCISIONAL BIOPSY Left 10/2020   BREAST LUMPECTOMY Right 07/2021   BREAST LUMPECTOMY WITH RADIOACTIVE SEED LOCALIZATION Left 11/03/2020   Procedure: LEFT BREAST LUMPECTOMY WITH RADIOACTIVE SEED  LOCALIZATION;  Surgeon: Harriette Bouillon, MD;  Location: [facility];  Service: General;  Laterality: Left;   BREAST LUMPECTOMY WITH RADIOACTIVE SEED LOCALIZATION Right 07/19/2021   Procedure: RIGHT BREAST LUMPECTOMY WITH RADIOACTIVE SEED LOCALIZATION;  Surgeon: Harriette Bouillon, MD;  Location: [facility];  Service: General;  Laterality: Right;   BUNIONECTOMY     CESAREAN SECTION     x3   COLONOSCOPY  07/16/2012   ESOPHAGOGASTRODUODENOSCOPY  02/01/2011   ESOPHAGOGASTRODUODENOSCOPY (EGD) WITH PROPOFOL N/A 12/12/2020   Procedure: ESOPHAGOGASTRODUODENOSCOPY (EGD) WITH PROPOFOL;  Surgeon: Lemar Lofty., MD;  Location: [facility];  Service: Gastroenterology;  Laterality: N/A;   EUS N/A 12/12/2020   Procedure: UPPER ENDOSCOPIC ULTRASOUND (EUS) RADIAL;  Surgeon: Lemar Lofty., MD;  Location: [facility];  Service: Gastroenterology;  Laterality: N/A;   PARTIAL HYSTERECTOMY     Still has ovaries   PLACEMENT OF BREAST IMPLANTS     POLYPECTOMY     SAVORY DILATION N/A 12/12/2020   Procedure: SAVORY DILATION;  Surgeon: Meridee Score Netty Starring., MD;  Location: [facility];  Service: Gastroenterology;  Laterality: N/A;   SHOULDER SURGERY     SUPRAVENTRICULAR TACHYCARDIA ABLATION N/A 07/31/2013   Procedure: SUPRAVENTRICULAR TACHYCARDIA ABLATION;  Surgeon: Marinus Maw, MD;  Location: [facility];  Service: Cardiovascular;  Laterality: N/A;     Current Outpatient Medications  Medication Sig Dispense Refill   alendronate (FOSAMAX) 70 MG tablet Take 70 mg by mouth once a week.     ofloxacin (OCUFLOX) 0.3 % ophthalmic solution      prednisoLONE acetate (PRED FORTE) 1 % ophthalmic suspension      cloNIDine (CATAPRES) 0.1 MG tablet Take 0.1 mg by mouth at bedtime.     [employer] SUNSCREEN SPF 30 EX [employer] progest cream once daily     gabapentin (NEURONTIN) 100 MG capsule Take 300 mg by mouth 3 (three) times daily.     ibuprofen (ADVIL) 400 MG tablet Take 400 mg  by mouth every 6 (six) hours as needed.     mirtazapine (REMERON) 15 MG tablet Take 15 mg by mouth at bedtime.     Multiple Vitamin (MULTIVITAMIN WITH MINERALS) TABS tablet Take 1 tablet by mouth daily.     pantoprazole (PROTONIX) 40 MG tablet Take 1 tablet (40 mg total) by mouth daily. 30 tablet 1   valACYclovir (VALTREX) 1000 MG tablet Take 0.5 tablets by mouth 2 (two) times daily as needed. Breakouts  0   zolpidem (AMBIEN CR) 12.5 MG CR tablet Take 12.5 mg by mouth at bedtime.     No current  facility-administered medications for this visit.    Allergies:   Patient has no known allergies.    Social History:  The patient  reports that she quit smoking about 44 years ago. Her smoking use included cigarettes. She has never used smokeless tobacco. She reports that she does not currently use alcohol. She reports that she does not use drugs.   Family History:  The patient's family history includes Breast cancer in her cousin and maternal aunt; Colon cancer in her maternal uncle; Colon cancer (age of onset: 29) in her father and nephew; Dementia in her maternal aunt; Heart disease in her mother; Lung cancer in her maternal aunt, maternal grandfather, and other family members; Ovarian cancer in her cousin; Ovarian cancer (age of onset: 67) in her cousin.    ROS:  Please see the history of present illness.   Otherwise, review of systems are positive for visual changes.   All other systems are reviewed and negative.    PHYSICAL EXAM: VS:  BP 112/68   Pulse 68   Ht 5\' 4"  (1.626 m)   Wt 111 lb 6.4 oz (50.5 kg)   SpO2 96%   BMI 19.12 kg/m  , BMI Body mass index is 19.12 kg/m. GEN: Well nourished, well developed, in no acute distress HEENT: normal Neck: no JVD, carotid bruits, or masses Cardiac: RRR; no murmurs, rubs, or gallops,no edema  Respiratory:  clear to auscultation bilaterally, normal work of breathing GI: soft, nontender, nondistended, + BS MS: no deformity or atrophy Skin: warm  and dry, no rash Neuro:  Strength and sensation are intact Psych: euthymic mood, full affect   EKG:   The ekg ordered today demonstrates AFib, rate controlled.    Recent Labs: 06/24/2022: ALT 15; BUN 17; Creatinine, Ser 0.76; Hemoglobin 13.5; Platelets 323; Potassium 3.9; Sodium 139; TSH 0.552   Lipid Panel No results found for: "CHOL", "TRIG", "HDL", "CHOLHDL", "VLDL", "LDLCALC", "LDLDIRECT"   Other studies Reviewed: Additional studies/ records that were reviewed today with results demonstrating: LDL 129, HDL 81 in 2023.   ASSESSMENT AND PLAN:  SVT: s/p ablation.  Sx that prompted ER visit have resolved.  No further dizziness.  Seeing Dr. Ladona Ridgel Prn. Paroxysmal AFib: Vague symptoms of palpitations in recent weeks likely represent intermittent atrial fibrillation.Start Eliquis 5 mg twice daily for Stroke prevention.  Will check with Dr. Allyne Gee whether retinal surgery can be done on anticoagulation.  Switch to acetaminophen and or Tylenol for arthritis pain.  Check echo.  Back in sinus rhythm by exam at the end of the visit.   Preoperative clearance:  Retina surgery upcoming.  Plan for echo before retinal surgery due to the atrial fibrillation.  breast cancer surgery was tolerated well last year and she maintains active lifestyle without any cardiac symptoms.  I have a very low suspicion for ischemic heart disease.  No need for stress test. Plan for calcium scoring CT. if the calcium score is elevated, may need to consider lipid-lowering therapy with a statin.    Current medicines are reviewed at length with the patient today.  The patient concerns regarding her medicines were addressed.  The following changes have been made:  No change  Labs/ tests ordered today include:   Orders Placed This Encounter  Procedures   EKG 12-Lead    Recommend 150 minutes/week of aerobic exercise Low fat, low carb, high fiber diet recommended  Disposition:   FU in 2-3  months   Signed, Lance Muss, MD  09/13/2022 9:51 AM  [facility] [address], [place], [place]  [address] Phone: [phone]; Fax: [phone]

## 2022-09-17 ENCOUNTER — Ambulatory Visit (HOSPITAL_COMMUNITY): Payer: Medicare Other | Attending: Interventional Cardiology

## 2022-09-17 DIAGNOSIS — I471 Supraventricular tachycardia, unspecified: Secondary | ICD-10-CM | POA: Insufficient documentation

## 2022-09-17 DIAGNOSIS — Z01810 Encounter for preprocedural cardiovascular examination: Secondary | ICD-10-CM | POA: Insufficient documentation

## 2022-09-17 DIAGNOSIS — I480 Paroxysmal atrial fibrillation: Secondary | ICD-10-CM | POA: Insufficient documentation

## 2022-09-17 LAB — ECHOCARDIOGRAM COMPLETE
Area-P 1/2: 2.59 cm2
S' Lateral: 2.9 cm

## 2022-09-18 NOTE — Telephone Encounter (Signed)
Please see upcoming notes from Shelda Jakes RN for Dr. Eldridge Dace. Pt is now on Eliquis.

## 2022-09-18 NOTE — Telephone Encounter (Addendum)
   Patient Name: [name]  DOB: 1950-08-22 MRN: [id number]  Primary Cardiologist: Lance Muss, MD  Chart reviewed as part of pre-operative protocol coverage. Given past medical history and time since last visit, based on ACC/AHA guidelines, DELMARIE TRABER is at acceptable risk for the planned procedure without further cardiovascular testing.  Patient completed echocardiogram for Dr. Eldridge Dace that was normal.   Patient is cleared to hold Eliquis 3 days prior to her procedure.  She should restart postprocedure when surgically safe and hemostasis is achieved.   The patient was advised that if she develops new symptoms prior to surgery to contact our office to arrange for a follow-up visit, and she verbalized understanding.  I will route this recommendation to the requesting party via Epic fax function and remove from pre-op pool.  Please call with questions.  Napoleon Form, Leodis Rains, NP 09/18/2022, 11:48 AM

## 2022-09-18 NOTE — Telephone Encounter (Signed)
This clearance note, recent office visit note and echo report faxed to Dr Allyne Gee office

## 2022-09-18 NOTE — Telephone Encounter (Signed)
Pt cleared by MD to hold Eliquis for eye surgery, see below:  ----- Message -----  From: Corky Crafts, MD  Sent: 09/14/2022  11:47 AM EDT  To: Stephannie Li, MD; Dossie Arbour, RN   Barbara Cower,  Please see below.  Looks like Dr. Ladona Ridgel is comfortable with holding Eliquis even longer than 2 days if needed before eye surgery.   JV  ----- Message -----  From: Marinus Maw, MD  Sent: 09/13/2022   7:12 PM EDT  To: Corky Crafts, MD   Agree. With CHADSVASC of 2, and one point for being a woman, she could stop her blood thinner for 5 days before and after and only still be low risk for problems from eye surgery. GT  ----- Message -----  From: Corky Crafts, MD  Sent: 09/13/2022  10:48 AM EDT  To: Stephannie Li, MD; Marinus Maw, MD   Sharlot Gowda, ECG in the office today showed atrial fibrillation.  By the time I examined her, she was back in normal sinus rhythm and her Apple Watch was reading a heart rate of 50 bpm.  CHA2DS2-VASc score of 2 based on gender and age.  Eliquis started in the office.  Barbara Cower, not sure if her vitrectomy can be done on Eliquis.  Getting an echo.  Could hold Eliquis 2 days prior to eye surgery if needed.   JV

## 2022-09-18 NOTE — Telephone Encounter (Signed)
I spoke with [place] (Arts development officer at Dr Allyne Gee office) and let her know patient is now on Eliquis.  [place] said Dr Allyne Gee requests patient hole Eliquis 3 days prior to procedure

## 2022-09-26 ENCOUNTER — Other Ambulatory Visit (HOSPITAL_COMMUNITY): Payer: Medicare Other

## 2022-09-28 ENCOUNTER — Encounter (HOSPITAL_COMMUNITY): Payer: Self-pay | Admitting: Gastroenterology

## 2022-10-05 ENCOUNTER — Ambulatory Visit (HOSPITAL_BASED_OUTPATIENT_CLINIC_OR_DEPARTMENT_OTHER)
Admission: RE | Admit: 2022-10-05 | Discharge: 2022-10-05 | Disposition: A | Discharge status: Home / Self Care | Payer: Medicare Other | Source: Ambulatory Visit | Attending: Interventional Cardiology | Admitting: Interventional Cardiology

## 2022-10-05 DIAGNOSIS — I471 Supraventricular tachycardia, unspecified: Secondary | ICD-10-CM | POA: Insufficient documentation

## 2022-10-10 ENCOUNTER — Ambulatory Visit (HOSPITAL_COMMUNITY): Payer: Medicare Other | Admitting: Anesthesiology

## 2022-10-10 ENCOUNTER — Encounter (HOSPITAL_COMMUNITY): Payer: Self-pay | Admitting: Gastroenterology

## 2022-10-10 ENCOUNTER — Ambulatory Visit (HOSPITAL_COMMUNITY)
Admission: RE | Admit: 2022-10-10 | Discharge: 2022-10-10 | Disposition: A | Discharge status: Home / Self Care | Payer: Medicare Other | Attending: Gastroenterology | Admitting: Gastroenterology

## 2022-10-10 ENCOUNTER — Telehealth: Payer: Self-pay

## 2022-10-10 ENCOUNTER — Ambulatory Visit (HOSPITAL_BASED_OUTPATIENT_CLINIC_OR_DEPARTMENT_OTHER): Payer: Medicare Other | Admitting: Anesthesiology

## 2022-10-10 ENCOUNTER — Other Ambulatory Visit: Payer: Self-pay

## 2022-10-10 ENCOUNTER — Encounter (HOSPITAL_COMMUNITY)
Admission: RE | Disposition: A | Discharge status: Home / Self Care | Payer: Self-pay | Source: Home / Self Care | Attending: Gastroenterology

## 2022-10-10 DIAGNOSIS — K641 Second degree hemorrhoids: Secondary | ICD-10-CM | POA: Diagnosis not present

## 2022-10-10 DIAGNOSIS — Z1211 Encounter for screening for malignant neoplasm of colon: Secondary | ICD-10-CM

## 2022-10-10 DIAGNOSIS — D490 Neoplasm of unspecified behavior of digestive system: Secondary | ICD-10-CM

## 2022-10-10 DIAGNOSIS — F418 Other specified anxiety disorders: Secondary | ICD-10-CM

## 2022-10-10 DIAGNOSIS — K644 Residual hemorrhoidal skin tags: Secondary | ICD-10-CM | POA: Insufficient documentation

## 2022-10-10 DIAGNOSIS — Z800 Family history of malignant neoplasm of digestive organs: Secondary | ICD-10-CM

## 2022-10-10 DIAGNOSIS — I4891 Unspecified atrial fibrillation: Secondary | ICD-10-CM | POA: Insufficient documentation

## 2022-10-10 DIAGNOSIS — I899 Noninfective disorder of lymphatic vessels and lymph nodes, unspecified: Secondary | ICD-10-CM | POA: Insufficient documentation

## 2022-10-10 DIAGNOSIS — K2289 Other specified disease of esophagus: Secondary | ICD-10-CM | POA: Insufficient documentation

## 2022-10-10 DIAGNOSIS — D120 Benign neoplasm of cecum: Secondary | ICD-10-CM | POA: Diagnosis not present

## 2022-10-10 DIAGNOSIS — K449 Diaphragmatic hernia without obstruction or gangrene: Secondary | ICD-10-CM | POA: Insufficient documentation

## 2022-10-10 DIAGNOSIS — Z1289 Encounter for screening for malignant neoplasm of other sites: Secondary | ICD-10-CM | POA: Diagnosis not present

## 2022-10-10 DIAGNOSIS — K5730 Diverticulosis of large intestine without perforation or abscess without bleeding: Secondary | ICD-10-CM | POA: Insufficient documentation

## 2022-10-10 DIAGNOSIS — Z87891 Personal history of nicotine dependence: Secondary | ICD-10-CM | POA: Diagnosis not present

## 2022-10-10 DIAGNOSIS — K639 Disease of intestine, unspecified: Secondary | ICD-10-CM

## 2022-10-10 DIAGNOSIS — R932 Abnormal findings on diagnostic imaging of liver and biliary tract: Secondary | ICD-10-CM

## 2022-10-10 HISTORY — PX: POLYPECTOMY: SHX5525

## 2022-10-10 HISTORY — PX: COLONOSCOPY WITH PROPOFOL: SHX5780

## 2022-10-10 HISTORY — PX: ESOPHAGOGASTRODUODENOSCOPY (EGD) WITH PROPOFOL: SHX5813

## 2022-10-10 HISTORY — PX: EUS: SHX5427

## 2022-10-10 SURGERY — UPPER ENDOSCOPIC ULTRASOUND (EUS) RADIAL
Anesthesia: Monitor Anesthesia Care

## 2022-10-10 MED ORDER — PROPOFOL 500 MG/50ML IV EMUL
INTRAVENOUS | Status: DC | PRN
  Administered 2022-10-10: 150 ug/kg/min via INTRAVENOUS

## 2022-10-10 MED ORDER — APIXABAN 5 MG PO TABS: 5.0000 mg | ORAL_TABLET | Freq: Two times a day (BID) | ORAL | 6 refills | Status: DC

## 2022-10-10 MED ORDER — PROPOFOL 10 MG/ML IV BOLUS
INTRAVENOUS | Status: DC | PRN
Start: 2022-10-10 — End: 2022-10-10
  Administered 2022-10-10: 30 mg via INTRAVENOUS
  Administered 2022-10-10: 50 mg via INTRAVENOUS

## 2022-10-10 MED ORDER — LACTATED RINGERS IV SOLN: INTRAVENOUS | Status: DC

## 2022-10-10 MED ORDER — PREDNISOLONE ACETATE 1 % OP SUSP
2.0000 [drp] | Freq: Once | OPHTHALMIC | Status: AC
  Administered 2022-10-10: 2 [drp] via OPHTHALMIC
  Filled 2022-10-10: qty 5

## 2022-10-10 MED ORDER — LIDOCAINE HCL (CARDIAC) PF 100 MG/5ML IV SOSY
PREFILLED_SYRINGE | INTRAVENOUS | Status: DC | PRN
  Administered 2022-10-10: 50 mg via INTRAVENOUS

## 2022-10-10 SURGICAL SUPPLY — 22 items

## 2022-10-10 NOTE — Anesthesia Postprocedure Evaluation (Signed)
Anesthesia Post Note  Patient: [name]  Procedure(s) Performed: UPPER ENDOSCOPIC ULTRASOUND (EUS) RADIAL COLONOSCOPY WITH PROPOFOL ESOPHAGOGASTRODUODENOSCOPY (EGD) WITH PROPOFOL POLYPECTOMY     Patient location during evaluation: PACU Anesthesia Type: MAC Level of consciousness: awake and alert Pain management: pain level controlled Vital Signs Assessment: post-procedure vital signs reviewed and stable Respiratory status: spontaneous breathing, nonlabored ventilation, respiratory function stable and patient connected to nasal cannula oxygen Cardiovascular status: stable and blood pressure returned to baseline Postop Assessment: no apparent nausea or vomiting Anesthetic complications: no  No notable events documented.  Last Vitals:  Vitals:   10/10/22 1115 10/10/22 1120  BP:    Pulse: (!) 57 (!) 54  Resp: 15 12  Temp:    SpO2: 100% 97%    Last Pain:  Vitals:   10/10/22 1030  TempSrc: Temporal  PainSc: 7                  Oval Moralez S

## 2022-10-10 NOTE — Progress Notes (Signed)
Brief GI progress note  Patient evaluated in PACU. From a GI perspective she is doing well. She is having some pain in her left thigh with irritation.  She recently underwent surgery within the last couple of weeks with her retina specialist.  She was on antibiotics and then placed on steroid eyedrops.  I told her to use saline or gentle tears for the next few hours and that if things persist or progress she should have the antibiotic ointments that she had previously restarted.  She should then reach out to her retina specialist or if things progress or worsen. If they worsen significantly, she needs to go to see retina specialist or come into the emergency department for further evaluation. I suspect this is irritation but nothing today suggest corneal laceration. Patient agrees with this plan of action  Corliss Parish, MD [facility] Gastroenterology Advanced Endoscopy Office # [phone]

## 2022-10-10 NOTE — Telephone Encounter (Signed)
CT order entered and sent to the schedulers   MRI MRCP reminder sent to call pt in 1 year   Waiting for pathology for timing of repeat colon.

## 2022-10-10 NOTE — Discharge Instructions (Signed)

## 2022-10-10 NOTE — Op Note (Signed)
[facility] Patient Name: [name] Procedure Date: 10/10/2022 MRN: [id number] Attending MD: Corliss Parish , MD, [id number] Date of Birth: 1950/11/05 CSN: [id number] Age: 72 Admit Type: Outpatient Procedure:                Upper EUS Indications:              Screening for pancreatic neoplasm Providers:                Corliss Parish, MD, Fransisca Connors, Priscella Mann, Technician Referring MD:             Corliss Parish, MD Medicines:                Monitored Anesthesia Care Complications:            No immediate complications. Estimated Blood Loss:     Estimated blood loss was minimal. Procedure:                Pre-Anesthesia Assessment:                           - Prior to the procedure, a History and Physical                            was performed, and patient medications and                            allergies were reviewed. The patient's tolerance of                            previous anesthesia was also reviewed. The risks                            and benefits of the procedure and the sedation                            options and risks were discussed with the patient.                            All questions were answered, and informed consent                            was obtained. Prior Anticoagulants: The patient has                            taken Eliquis (apixaban), last dose was 2 days                            prior to procedure. ASA Grade Assessment: III - A                            patient with severe systemic disease. After  reviewing the risks and benefits, the patient was                            deemed in satisfactory condition to undergo the                            procedure.                           After obtaining informed consent, the endoscope was                            passed under direct vision. Throughout the                            procedure, the  patient's blood pressure, pulse, and                            oxygen saturations were monitored continuously. The                            [id number]-H190 ([id number]) Olympus endoscope was introduced                            through the mouth, and advanced to the second part                            of duodenum. The [id number]-Q190V ([id number]) Olympus                            duodenoscope was introduced through the mouth, and                            advanced to the area of papilla. The GF-[id number]                            ([id number]) Olympus linear ultrasound scope was                            introduced through the mouth, and advanced to the                            duodenum for ultrasound examination from the                            stomach and duodenum. The upper EUS was                            accomplished without difficulty. The patient                            tolerated the procedure. Scope In: Scope Out: Findings:      ENDOSCOPIC FINDING: :      No gross lesions were noted in the entire esophagus.  The Z-line was irregular and was found 38 cm from the incisors.      A 3 cm hiatal hernia was present.      No gross lesions were noted in the entire examined stomach.      No gross lesions were noted in the duodenal bulb, in the first portion       of the duodenum and in the second portion of the duodenum.      The major papilla was normal.      ENDOSONOGRAPHIC FINDING: :      There was no sign of significant endosonographic abnormality in the       pancreatic head (PD = 1.7 mm), genu of the pancreas (PD = 1.8 mm),       pancreatic body (slight prominence PD = 2.2 mm), pancreatic tail (slight       prominence PD = 1.5 mm) and uncinate process of the pancreas. The       pancreatic duct was well visualized from ampulla to tail.      There was no sign of significant endosonographic abnormality in the       common bile duct (2.8 mm -> 5.2 mm) and in the common hepatic duct (6.1        mm). An unremarkable gallbladder was identified.      Endosonographic imaging of the ampulla showed no intramural       (subepithelial) lesion.      Endosonographic imaging in the visualized portion of the liver showed no       mass.      No malignant-appearing lymph nodes were visualized in the celiac region       (level 20), peripancreatic region and porta hepatis region.      The celiac region was visualized. Impression:               EGD impression:                           - No gross lesions in the entire esophagus. Z-line                            irregular, 38 cm from the incisors.                           - 3 cm hiatal hernia.                           - No gross lesions in the entire stomach.                           - No gross lesions in the duodenal bulb, in the                            first portion of the duodenum and in the second                            portion of the duodenum.                           - Normal major papilla.  EUS impression:                           - There was no sign of significant pathology in the                            pancreatic head, genu of the pancreas, pancreatic                            body, pancreatic tail and uncinate process of the                            pancreas. There was very slight prominence of the                            pancreatic duct in the body and tail region but no                            evidence of significant abnormality otherwise. In                            comparison to my previous EUS from 2 years ago this                            is unchanged.                           - There was no sign of significant pathology in the                            common bile duct and in the common hepatic duct.                           - No malignant-appearing lymph nodes were                            visualized in the celiac region (level 20),                             peripancreatic region and porta hepatis region. Moderate Sedation:      Not Applicable - Patient had care per Anesthesia. Recommendation:           - Proceed to scheduled colonoscopy.                           - Observe patient's clinical course.                           - For future surveillance for pancreatic cancer,                            alternating upper endoscopic ultrasound and  cross-sectional imaging is recommended. Will plan                            MRI/MRCP in 1 year. Pending all as well, upper EUS                            in 2 years.                           - The findings and recommendations were discussed                            with the patient.                           - The findings and recommendations were discussed                            with the designated responsible adult. Procedure Code(s):        --- Professional ---                           [phone], Esophagogastroduodenoscopy, flexible,                            transoral; with endoscopic ultrasound examination                            limited to the esophagus, stomach or duodenum, and                            adjacent structures Diagnosis Code(s):        --- Professional ---                           K22.89, Other specified disease of esophagus                           K44.9, Diaphragmatic hernia without obstruction or                            gangrene                           I89.9, Noninfective disorder of lymphatic vessels                            and lymph nodes, unspecified                           Z12.89, Encounter for screening for malignant                            neoplasm of other sites CPT copyright 2022 [employer]. All rights reserved. The codes documented in this report are preliminary and upon coder review may  be revised to meet current compliance requirements. [employer],  MD 10/10/2022 10:29:14 AM Number of  Addenda: 0

## 2022-10-10 NOTE — Transfer of Care (Signed)
Immediate Anesthesia Transfer of Care Note  Patient: [name]  Procedure(s) Performed: UPPER ENDOSCOPIC ULTRASOUND (EUS) RADIAL COLONOSCOPY WITH PROPOFOL ESOPHAGOGASTRODUODENOSCOPY (EGD) WITH PROPOFOL POLYPECTOMY  Patient Location: Short Stay  Anesthesia Type:MAC  Level of Consciousness: awake, drowsy, and patient cooperative  Airway & Oxygen Therapy: Patient Spontanous Breathing and Patient connected to face mask oxygen  Post-op Assessment: Report given to RN, Post -op Vital signs reviewed and stable, and Patient moving all extremities X 4  Post vital signs: Reviewed and stable  Last Vitals:  Vitals Value Taken Time  BP 96/63 10/10/22 1021  Temp    Pulse 55 10/10/22 1022  Resp 13 10/10/22 1022  SpO2 100 % 10/10/22 1022  Vitals shown include unfiled device data.  Last Pain:  Vitals:   10/10/22 0819  TempSrc: Temporal  PainSc: 0-No pain         Complications: No notable events documented.

## 2022-10-10 NOTE — Telephone Encounter (Signed)
-----   Message from [facility] sent at 10/10/2022 12:47 PM EDT ----- Regarding: Follow-up [name], This patient needs an MRI/MRCP in 1 year for pancreas cancer screening.  You can place that under my name. This patient needs CT abdomen/pelvis with IV and oral contrast to evaluate a cecal abnormality question extrinsic compression versus cecal subepithelial lesion which looks different than Dr. Ardell Isaacs previous colonoscopies.  Patient is aware of these tests.  CT scan can be placed under Dr. Ardell Isaacs name or mine. Will await pathology otherwise for timing of repeat colonoscopy. Thanks. GM  FYI MS

## 2022-10-10 NOTE — Op Note (Signed)
[facility] Patient Name: [name] Procedure Date: 10/10/2022 MRN: [id number] Attending MD: Corliss Parish , MD, [id number] Date of Birth: 24-Aug-1950 CSN: [id number] Age: 72 Admit Type: Outpatient Procedure:                Colonoscopy Indications:              Screening in patient at increased risk: Family                            history of 1st-degree relative with colorectal                            cancer Providers:                Corliss Parish, MD, Fransisca Connors, Priscella Mann, Technician Referring MD:             Venita Lick. Russella Dar, MD, Sneha P. Raju Medicines:                Monitored Anesthesia Care Complications:            No immediate complications. Estimated Blood Loss:     Estimated blood loss was minimal. Procedure:                Pre-Anesthesia Assessment:                           - Prior to the procedure, a History and Physical                            was performed, and patient medications and                            allergies were reviewed. The patient's tolerance of                            previous anesthesia was also reviewed. The risks                            and benefits of the procedure and the sedation                            options and risks were discussed with the patient.                            All questions were answered, and informed consent                            was obtained. Prior Anticoagulants: The patient has                            taken Eliquis (apixaban), last dose was 2 days  prior to procedure. ASA Grade Assessment: III - A                            patient with severe systemic disease. After                            reviewing the risks and benefits, the patient was                            deemed in satisfactory condition to undergo the                            procedure.                           After obtaining informed  consent, the colonoscope                            was passed under direct vision. Throughout the                            procedure, the patient's blood pressure, pulse, and                            oxygen saturations were monitored continuously. The                            PCF-HQ190L ([id number]) Olympus colonoscope was                            introduced through the anus and advanced to the 3                            cm into the ileum. The colonoscopy was performed                            without difficulty. The patient tolerated the                            procedure. The quality of the bowel preparation was                            adequate. The terminal ileum, ileocecal valve,                            appendiceal orifice, and rectum were photographed. Scope In: 9:57:38 AM Scope Out: 10:15:29 AM Scope Withdrawal Time: 0 hours 14 minutes 2 seconds  Total Procedure Duration: 0 hours 17 minutes 51 seconds  Findings:      The digital rectal exam findings include hemorrhoids. Pertinent       negatives include no palpable rectal lesions.      The colon (entire examined portion) was moderately tortuous.      The terminal ileum and ileocecal valve appeared normal.      An extrinsic impression versus subepithelial  medium-sized nodule was       found in the cecum. In addition, its diameter measured 25 mm.      A 12 mm polyp was found in the cecum. The polyp was sessile. The polyp       was removed with a cold snare. Resection and retrieval were complete.      A few small-mouthed diverticula were found in the recto-sigmoid colon       and sigmoid colon.      Normal mucosa was found in the entire colon otherwise.      Non-bleeding non-thrombosed external and internal hemorrhoids were found       during retroflexion, during perianal exam and during digital exam. The       hemorrhoids were Grade II (internal hemorrhoids that prolapse but reduce        spontaneously). Impression:               - Hemorrhoids found on digital rectal exam.                           - Tortuous colon.                           - The examined portion of the ileum was normal.                           - Extrinsic impression for subepithelial nodule                            noted within the cecum.                           - One 12 mm polyp in the cecum, removed with a cold                            snare. Resected and retrieved.                           - Diverticulosis in the recto-sigmoid colon and in                            the sigmoid colon.                           - Normal mucosa in the entire examined colon                            otherwise.                           - Non-bleeding non-thrombosed external and internal                            hemorrhoids. Moderate Sedation:      Not Applicable - Patient had care per Anesthesia. Recommendation:           - The patient will be observed post-procedure,  until all discharge criteria are met.                           - Discharge patient to home.                           - Patient has a contact number available for                            emergencies. The signs and symptoms of potential                            delayed complications were discussed with the                            patient. Return to normal activities tomorrow.                            Written discharge instructions were provided to the                            patient.                           - High fiber diet.                           - Use FiberCon 1-2 tablets PO daily.                           - May restart Eliquis on 7/26 to decrease post                            interventional bleeding risk.                           - Await pathology results.                           - Recommend nonurgent CT abdomen/pelvis with IV and                            oral contrast to delineate the  cecum region. The                            subepithelial/submucosal lesion/nodule see if                            anything is being noted or seen in regards to                            extrinsic impression. I reviewed the previous                            colonoscopy reports imaging of that area  endoscopically did not suggest abnormality.                           - This will decide our follow-up colonoscopy which                            I think it should still be 3 years, unless                            something on imaging is shown to be abnormal.                           - The findings and recommendations were discussed                            with the patient.                           - The findings and recommendations were discussed                            with the designated responsible adult. Procedure Code(s):        --- Professional ---                           [phone], Colonoscopy, flexible; with removal of                            tumor(s), polyp(s), or other lesion(s) by snare                            technique Diagnosis Code(s):        --- Professional ---                           Z80.0, Family history of malignant neoplasm of                            digestive organs                           K64.1, Second degree hemorrhoids                           D49.0, Neoplasm of unspecified behavior of                            digestive system                           D12.0, Benign neoplasm of cecum                           K57.30, Diverticulosis of large intestine without                            perforation  or abscess without bleeding                           Q43.8, Other specified congenital malformations of                            intestine CPT copyright 2022 American Medical Association. All rights reserved. The codes documented in this report are preliminary and upon coder review may  be revised to meet current  compliance requirements. Corliss Parish, MD 10/10/2022 10:36:09 AM Number of Addenda: 0

## 2022-10-10 NOTE — Anesthesia Preprocedure Evaluation (Signed)
Anesthesia Evaluation  Patient identified by MRN, date of birth, ID band Patient awake    Reviewed: Allergy & Precautions, H&P , NPO status , Patient's Chart, lab work & pertinent test results  Airway Mallampati: II  TM Distance: >3 FB Neck ROM: Full    Dental no notable dental hx.    Pulmonary neg pulmonary ROS, former smoker   Pulmonary exam normal breath sounds clear to auscultation       Cardiovascular Normal cardiovascular exam+ dysrhythmias Atrial Fibrillation  Rhythm:Regular Rate:Normal  Moderate TR EF normal   Neuro/Psych negative neurological ROS  negative psych ROS   GI/Hepatic Neg liver ROS,GERD  ,,  Endo/Other  negative endocrine ROS    Renal/GU negative Renal ROS  negative genitourinary   Musculoskeletal negative musculoskeletal ROS (+)    Abdominal   Peds negative pediatric ROS (+)  Hematology negative hematology ROS (+)   Anesthesia Other Findings   Reproductive/Obstetrics negative OB ROS                             Anesthesia Physical Anesthesia Plan  ASA: 3  Anesthesia Plan: MAC   Post-op Pain Management: Minimal or no pain anticipated   Induction: Intravenous  PONV Risk Score and Plan: 2 and Propofol infusion and Treatment may vary due to age or medical condition  Airway Management Planned: Simple Face Mask  Additional Equipment:   Intra-op Plan:   Post-operative Plan:   Informed Consent: I have reviewed the patients History and Physical, chart, labs and discussed the procedure including the risks, benefits and alternatives for the proposed anesthesia with the patient or authorized representative who has indicated his/her understanding and acceptance.     Dental advisory given  Plan Discussed with: CRNA and Surgeon  Anesthesia Plan Comments:        Anesthesia Quick Evaluation

## 2022-10-10 NOTE — H&P (Addendum)
GASTROENTEROLOGY PROCEDURE H&P NOTE   Primary Care Physician: Thana Ates, MD  HPI: [name] is a 72 y.o. female who presents for EGD/EUS for high risk pancreatic cancer screening and colon cancer screening/polyp surveillance.  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   Bunion    Cataract    mild    Colon polyps    Dysrhythmia 07/2013   SVT   Enthesopathy of ankle and tarsus, unspecified    Esophageal reflux    Family history of breast cancer    Family history of lung cancer    Family history of malignant neoplasm of gastrointestinal tract    Family history of ovarian cancer    Hiatal hernia    Irritable bowel syndrome    Monoallelic mutation of CDKN2A gene    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HCC)    Other acquired calcaneus deformity    Personal history of colonic polyps 2002   hyperplastic   Personal history of malignant melanoma    Personal history of radiation therapy    Personal history of squamous cell carcinoma of skin    Plantar fascial fibromatosis    Rotator cuff syndrome of shoulder and allied disorders    Skin cancer    Syncope    Past Surgical History:  Procedure Laterality Date   ABLATION OF DYSRHYTHMIC FOCUS  07/31/2013   DR Ladona Ridgel   ARTHROPLASTY  2006   thumb/wrist    AUGMENTATION MAMMAPLASTY Bilateral    BRAVO PH STUDY  02/01/2011   Procedure: BRAVO PH STUDY;  Surgeon: Louis Meckel, MD;  Location: [facility];  Service: Endoscopy;  Laterality: N/A;   BREAST BIOPSY Left 09/2020   2023   BREAST BIOPSY Right 06/20/2021   BREAST BIOPSY Right 05/22/2022   [place] RT BREAST BX W LOC DEV 1ST LESION IMG BX SPEC US GUIDE 05/22/2022 [facility]   BREAST EXCISIONAL BIOPSY Left 10/2020   BREAST LUMPECTOMY Right 07/2021   BREAST LUMPECTOMY WITH RADIOACTIVE SEED LOCALIZATION Left 11/03/2020   Procedure:  LEFT BREAST LUMPECTOMY WITH RADIOACTIVE SEED LOCALIZATION;  Surgeon: Harriette Bouillon, MD;  Location: [facility];  Service: General;  Laterality: Left;   BREAST LUMPECTOMY WITH RADIOACTIVE SEED LOCALIZATION Right 07/19/2021   Procedure: RIGHT BREAST LUMPECTOMY WITH RADIOACTIVE SEED LOCALIZATION;  Surgeon: Harriette Bouillon, MD;  Location: [facility];  Service: General;  Laterality: Right;   BUNIONECTOMY     CESAREAN SECTION     x3   COLONOSCOPY  07/16/2012   ESOPHAGOGASTRODUODENOSCOPY  02/01/2011   ESOPHAGOGASTRODUODENOSCOPY (EGD) WITH PROPOFOL N/A 12/12/2020   Procedure: ESOPHAGOGASTRODUODENOSCOPY (EGD) WITH PROPOFOL;  Surgeon: Lemar Lofty., MD;  Location: [facility];  Service: Gastroenterology;  Laterality: N/A;   EUS N/A 12/12/2020   Procedure: UPPER ENDOSCOPIC ULTRASOUND (EUS) RADIAL;  Surgeon: Lemar Lofty., MD;  Location: [facility];  Service: Gastroenterology;  Laterality: N/A;   PARTIAL HYSTERECTOMY     Still has ovaries   PLACEMENT OF BREAST IMPLANTS     POLYPECTOMY     SAVORY DILATION N/A 12/12/2020   Procedure: SAVORY DILATION;  Surgeon: Meridee Score Netty Starring., MD;  Location: [facility];  Service: Gastroenterology;  Laterality: N/A;   SHOULDER SURGERY     SUPRAVENTRICULAR TACHYCARDIA ABLATION N/A 07/31/2013   Procedure: SUPRAVENTRICULAR TACHYCARDIA  ABLATION;  Surgeon: Marinus Maw, MD;  Location: [facility];  Service: Cardiovascular;  Laterality: N/A;   No current facility-administered medications for this encounter.   No current facility-administered medications for this encounter. Allergies  Allergen Reactions   Citalopram Nausea Only   Paroxetine     anorgasmia   Venlafaxine Diarrhea   Family History  Problem Relation Age of Onset   Colon cancer Father 58   Heart disease Mother    Breast cancer Maternal Aunt        dx 31s   Lung cancer Maternal Aunt        dx 79s, smoker   Ovarian cancer Cousin        dx  78s, maternal first cousin   Lung cancer Maternal Grandfather        dx 91s, smoker   Colon cancer Maternal Uncle        dx 64s   Lung cancer Other        MGF's brother   Colon cancer Nephew 50   Dementia Maternal Aunt    Breast cancer Cousin        dx 58s, maternal first cousin   Ovarian cancer Cousin 26       dysgerminoma, maternal first cousin   Lung cancer Other        MGF's brother   Colon polyps Neg Hx    Esophageal cancer Neg Hx    Rectal cancer Neg Hx    Stomach cancer Neg Hx    Social History   Socioeconomic History   Marital status: Divorced    Spouse name: Not on file   Number of children: 3   Years of education: Not on file   Highest education level: Not on file  Occupational History   Occupation: Engineer, building services    Employer: [employer]  Tobacco Use   Smoking status: Former    Current packs/day: 0.00    Types: Cigarettes    Quit date: 03/19/1978    Years since quitting: 44.5   Smokeless tobacco: Never  Vaping Use   Vaping status: Never Used  Substance and Sexual Activity   Alcohol use: Not Currently    Comment: rarely- twice a yr if that    Drug use: No   Sexual activity: Not Currently    Birth control/protection: Surgical    Comment: partial hyst  Other Topics Concern   Not on file  Social History Narrative   Not on file   Social Determinants of Health   Financial Resource Strain: Not on file  Food Insecurity: Not on file  Transportation Needs: Not on file  Physical Activity: Not on file  Stress: Not on file  Social Connections: Not on file  Intimate Partner Violence: Not on file    Physical Exam: There were no vitals filed for this visit. There is no height or weight on file to calculate BMI. GEN: NAD EYE: Sclerae anicteric ENT: MMM CV: Non-tachycardic GI: Soft, NT/ND NEURO:  Alert & Oriented x 3  Lab Results: No results for input(s): "WBC", "HGB", "HCT", "PLT" in the last 72 hours. BMET No results for input(s):  "NA", "K", "CL", "CO2", "GLUCOSE", "BUN", "CREATININE", "CALCIUM" in the last 72 hours. LFT No results for input(s): "PROT", "ALBUMIN", "AST", "ALT", "ALKPHOS", "BILITOT", "BILIDIR", "IBILI" in the last 72 hours. PT/INR No results for input(s): "LABPROT", "INR" in the last 72 hours.   Impression / Plan: This is a 72 y.o.female who presents for EGD/EUS  for high risk pancreatic cancer screening and colon cancer screening/polyp surveillance.  The risks of an EUS including intestinal perforation, bleeding, infection, aspiration, and medication effects were discussed as was the possibility it may not give a definitive diagnosis if a biopsy is performed.     The risks and benefits of endoscopic evaluation/treatment were discussed with the patient and/or family; these include but are not limited to the risk of perforation, infection, bleeding, missed lesions, lack of diagnosis, severe illness requiring hospitalization, as well as anesthesia and sedation related illnesses.  The patient's history has been reviewed, patient examined, no change in status, and deemed stable for procedure.  The patient and/or family is agreeable to proceed.    Corliss Parish, MD [facility] Advanced [facility]oscopy Office # [phone]

## 2022-10-11 ENCOUNTER — Encounter: Payer: Self-pay | Admitting: Gastroenterology

## 2022-10-11 LAB — SURGICAL PATHOLOGY

## 2022-10-12 ENCOUNTER — Encounter (HOSPITAL_COMMUNITY): Payer: Self-pay | Admitting: Gastroenterology

## 2022-10-18 ENCOUNTER — Telehealth: Payer: Self-pay | Admitting: Gastroenterology

## 2022-10-18 NOTE — Telephone Encounter (Signed)
The pt saw the ASA Grade assessment III on the procedure report and thought she had those issues. I explained she does not have those issues but that is used to determine her eligibility for the [facility].  The pt has been advised of the information and verbalized understanding.

## 2022-10-18 NOTE — Telephone Encounter (Signed)
Inbound call from patient stating that something come up in her mychart stating that she had some type of disease and is requesting a call from the nurse to discuss.Patient had colonoscopy on 7/24. Please advise.

## 2022-10-21 ENCOUNTER — Ambulatory Visit (HOSPITAL_BASED_OUTPATIENT_CLINIC_OR_DEPARTMENT_OTHER)
Admission: RE | Admit: 2022-10-21 | Discharge: 2022-10-21 | Disposition: A | Discharge status: Home / Self Care | Payer: Medicare Other | Source: Ambulatory Visit | Attending: Gastroenterology | Admitting: Gastroenterology

## 2022-10-21 DIAGNOSIS — R932 Abnormal findings on diagnostic imaging of liver and biliary tract: Secondary | ICD-10-CM | POA: Diagnosis present

## 2022-10-21 DIAGNOSIS — K639 Disease of intestine, unspecified: Secondary | ICD-10-CM | POA: Diagnosis present

## 2022-10-21 MED ORDER — IOHEXOL 300 MG/ML  SOLN
75.0000 mL | Freq: Once | INTRAMUSCULAR | Status: AC | PRN
  Administered 2022-10-21: 75 mL via INTRAVENOUS

## 2022-10-25 ENCOUNTER — Telehealth: Payer: Self-pay

## 2022-10-25 NOTE — Telephone Encounter (Signed)
The pt has been advised of the results and recommendation to f/u with GYN.

## 2022-10-25 NOTE — Telephone Encounter (Signed)
The pt has been advised of the results. I have sent  a copy to her GYN.

## 2022-10-25 NOTE — Telephone Encounter (Signed)
-----   Message from Azure T. Russella Dar sent at 10/25/2022  9:30 AM EDT ----- Regarding: FW: Right adnexal cyst.  GYN referral for further follow up ----- Message ----- From: Interface, Rad Results In Sent: 10/24/2022   5:06 PM EDT To: Meryl Dare, MD

## 2022-10-25 NOTE — Telephone Encounter (Signed)
Inbound call from patient returning phone call. Patient requesting a call back. Please advise, thank you.

## 2022-11-01 ENCOUNTER — Encounter: Payer: Self-pay | Admitting: Interventional Cardiology

## 2022-11-01 NOTE — Telephone Encounter (Signed)
Best, C[facility]lin Deer, [facility]     Fosamax is not known to increase heart rate. However, recommend she take Fosamax only when rising first thing in the morning and at least 30 minutes before food, drink, or other medications. Take with a full glass of water. She should not lie down for 30 minutes after taking medication.

## 2022-12-14 ENCOUNTER — Ambulatory Visit: Payer: Medicare Other | Attending: Physician Assistant | Admitting: Physician Assistant

## 2022-12-14 ENCOUNTER — Encounter: Payer: Self-pay | Admitting: Physician Assistant

## 2022-12-14 VITALS — BP 102/72 | HR 70 | Ht 64.0 in | Wt 111.6 lb

## 2022-12-14 DIAGNOSIS — I471 Supraventricular tachycardia, unspecified: Secondary | ICD-10-CM | POA: Diagnosis not present

## 2022-12-14 DIAGNOSIS — Z79899 Other long term (current) drug therapy: Secondary | ICD-10-CM | POA: Diagnosis not present

## 2022-12-14 DIAGNOSIS — I480 Paroxysmal atrial fibrillation: Secondary | ICD-10-CM

## 2022-12-14 NOTE — Patient Instructions (Signed)
Medication Instructions:  Your physician recommends that you continue on your current medications as directed. Please refer to the Current Medication list given to you today.  *If you need a refill on your cardiac medications before your next appointment, please call your pharmacy*  Lab Work: None ordered If you have labs (blood work) drawn today and your tests are completely normal, you will receive your results only by: MyChart Message (if you have MyChart) OR A paper copy in the mail If you have any lab test that is abnormal or we need to change your treatment, we will call you to review the results.  Follow-Up: At [facility], you and your health needs are our priority.  As part of our continuing mission to provide you with exceptional heart care, we have created designated Provider Care Teams.  These Care Teams include your primary Cardiologist (physician) and Advanced Practice Providers (APPs -  Physician Assistants and Nurse Practitioners) who all work together to provide you with the care you need, when you need it.   Your next appointment:   2 week(s)  Provider:   Lewayne Bunting, MD   Schedule 2 week follow up with PharmD.   Heart-Healthy Eating Plan Many factors influence your heart health, including eating and exercise habits. Heart health is also called coronary health. Coronary risk increases with abnormal blood fat (lipid) levels. A heart-healthy eating plan includes limiting unhealthy fats, increasing healthy fats, limiting salt (sodium) intake, and making other diet and lifestyle changes. What is my plan? Your health care provider may recommend that: You limit your fat intake to _________% or less of your total calories each day. You limit your saturated fat intake to _________% or less of your total calories each day. You limit the amount of cholesterol in your diet to less than _________ mg per day. You limit the amount of sodium in your diet to less than  _________ mg per day. What are tips for following this plan? Cooking Cook foods using methods other than frying. Baking, boiling, grilling, and broiling are all good options. Other ways to reduce fat include: Removing the skin from poultry. Removing all visible fats from meats. Steaming vegetables in water or broth. Meal planning  At meals, imagine dividing your plate into fourths: Fill one-half of your plate with vegetables and green salads. Fill one-fourth of your plate with whole grains. Fill one-fourth of your plate with lean protein foods. Eat 2-4 cups of vegetables per day. One cup of vegetables equals 1 cup (91 g) broccoli or cauliflower florets, 2 medium carrots, 1 large bell pepper, 1 large sweet potato, 1 large tomato, 1 medium white potato, 2 cups (150 g) raw leafy greens. Eat 1-2 cups of fruit per day. One cup of fruit equals 1 small apple, 1 large banana, 1 cup (237 g) mixed fruit, 1 large orange,  cup (82 g) dried fruit, 1 cup (240 mL) 100% fruit juice. Eat more foods that contain soluble fiber. Examples include apples, broccoli, carrots, beans, peas, and barley. Aim to get 25-30 g of fiber per day. Increase your consumption of legumes, nuts, and seeds to 4-5 servings per week. One serving of dried beans or legumes equals  cup (90 g) cooked, 1 serving of nuts is  oz (12 almonds, 24 pistachios, or 7 walnut halves), and 1 serving of seeds equals  oz (8 g). Fats Choose healthy fats more often. Choose monounsaturated and polyunsaturated fats, such as olive and canola oils, avocado oil, flaxseeds, walnuts,  almonds, and seeds. Eat more omega-3 fats. Choose salmon, mackerel, sardines, tuna, flaxseed oil, and ground flaxseeds. Aim to eat fish at least 2 times each week. Check food labels carefully to identify foods with trans fats or high amounts of saturated fat. Limit saturated fats. These are found in animal products, such as meats, butter, and cream. Plant sources of saturated  fats include palm oil, palm kernel oil, and coconut oil. Avoid foods with partially hydrogenated oils in them. These contain trans fats. Examples are stick margarine, some tub margarines, cookies, crackers, and other baked goods. Avoid fried foods. General information Eat more home-cooked food and less restaurant, buffet, and fast food. Limit or avoid alcohol. Limit foods that are high in added sugar and simple starches such as foods made using white refined flour (white breads, pastries, sweets). Lose weight if you are overweight. Losing just 5-10% of your body weight can help your overall health and prevent diseases such as diabetes and heart disease. Monitor your sodium intake, especially if you have high blood pressure. Talk with your health care provider about your sodium intake. Try to incorporate more vegetarian meals weekly. What foods should I eat? Fruits All fresh, canned (in natural juice), or frozen fruits. Vegetables Fresh or frozen vegetables (raw, steamed, roasted, or grilled). Green salads. Grains Most grains. Choose whole wheat and whole grains most of the time. Rice and pasta, including brown rice and pastas made with whole wheat. Meats and other proteins Lean, well-trimmed beef, veal, pork, and lamb. Chicken and [place] without skin. All fish and shellfish. Wild duck, rabbit, pheasant, and venison. Egg whites or low-cholesterol egg substitutes. Dried beans, peas, lentils, and tofu. Seeds and most nuts. Dairy Low-fat or nonfat cheeses, including ricotta and mozzarella. Skim or 1% milk (liquid, powdered, or evaporated). Buttermilk made with low-fat milk. Nonfat or low-fat yogurt. Fats and oils Non-hydrogenated (trans-free) margarines. Vegetable oils, including soybean, sesame, sunflower, olive, avocado, peanut, safflower, corn, canola, and cottonseed. Salad dressings or mayonnaise made with a vegetable oil. Beverages Water (mineral or sparkling). Coffee and tea. Unsweetened  ice tea. Diet beverages. Sweets and desserts Sherbet, gelatin, and fruit ice. Small amounts of dark chocolate. Limit all sweets and desserts. Seasonings and condiments All seasonings and condiments. The items listed above may not be a complete list of foods and beverages you can eat. Contact a dietitian for more options. What foods should I avoid? Fruits Canned fruit in heavy syrup. Fruit in cream or butter sauce. Fried fruit. Limit coconut. Vegetables Vegetables cooked in cheese, cream, or butter sauce. Fried vegetables. Grains Breads made with saturated or trans fats, oils, or whole milk. Croissants. Sweet rolls. Donuts. High-fat crackers, such as cheese crackers and chips. Meats and other proteins Fatty meats, such as hot dogs, ribs, sausage, bacon, rib-eye roast or steak. High-fat deli meats, such as salami and bologna. Caviar. Domestic duck and goose. Organ meats, such as liver. Dairy Cream, sour cream, cream cheese, and creamed cottage cheese. Whole-milk cheeses. Whole or 2% milk (liquid, evaporated, or condensed). Whole buttermilk. Cream sauce or high-fat cheese sauce. Whole-milk yogurt. Fats and oils Meat fat, or shortening. Cocoa butter, hydrogenated oils, palm oil, coconut oil, palm kernel oil. Solid fats and shortenings, including bacon fat, salt pork, lard, and butter. Nondairy cream substitutes. Salad dressings with cheese or sour cream. Beverages Regular sodas and any drinks with added sugar. Sweets and desserts Frosting. Pudding. Cookies. Cakes. Pies. Milk chocolate or white chocolate. Buttered syrups. Full-fat ice cream or ice cream drinks. The  items listed above may not be a complete list of foods and beverages to avoid. Contact a dietitian for more information. Summary Heart-healthy meal planning includes limiting unhealthy fats, increasing healthy fats, limiting salt (sodium) intake and making other diet and lifestyle changes. Lose weight if you are overweight. Losing  just 5-10% of your body weight can help your overall health and prevent diseases such as diabetes and heart disease. Focus on eating a balance of foods, including fruits and vegetables, low-fat or nonfat dairy, lean protein, nuts and legumes, whole grains, and heart-healthy oils and fats. This information is not intended to replace advice given to you by your health care provider. Make sure you discuss any questions you have with your health care provider. Document Revised: 04/10/2021 Document Reviewed: 04/10/2021 Elsevier Patient Education  2024 Elsevier Inc. Low-Sodium Eating Plan Salt (sodium) helps you keep a healthy balance of fluids in your body. Too much sodium can raise your blood pressure. It can also cause fluid and waste to be held in your body. Your health care provider or dietitian may recommend a low-sodium eating plan if you have high blood pressure (hypertension), kidney disease, liver disease, or heart failure. Eating less sodium can help lower your blood pressure and reduce swelling. It can also protect your heart, liver, and kidneys. What are tips for following this plan? Reading food labels  Check food labels for the amount of sodium per serving. If you eat more than one serving, you must multiply the listed amount by the number of servings. Choose foods with less than 140 milligrams (mg) of sodium per serving. Avoid foods with 300 mg of sodium or more per serving. Always check how much sodium is in a product, even if the label says "unsalted" or "no salt added." Shopping  Buy products labeled as "low-sodium" or "no salt added." Buy fresh foods. Avoid canned foods and pre-made or frozen meals. Avoid canned, cured, or processed meats. Buy breads that have less than 80 mg of sodium per slice. Cooking  Eat more home-cooked food. Try to eat less restaurant, buffet, and fast food. Try not to add salt when you cook. Use salt-free seasonings or herbs instead of table salt or  sea salt. Check with your provider or pharmacist before using salt substitutes. Cook with plant-based oils, such as canola, sunflower, or olive oil. Meal planning When eating at a restaurant, ask if your food can be made with less salt or no salt. Avoid dishes labeled as brined, pickled, cured, or smoked. Avoid dishes made with soy sauce, miso, or teriyaki sauce. Avoid foods that have monosodium glutamate (MSG) in them. MSG may be added to some restaurant food, sauces, soups, bouillon, and canned foods. Make meals that can be grilled, baked, poached, roasted, or steamed. These are often made with less sodium. General information Try to limit your sodium intake to 1,500-2,300 mg each day, or the amount told by your provider. What foods should I eat? Fruits Fresh, frozen, or canned fruit. Fruit juice. Vegetables Fresh or frozen vegetables. "No salt added" canned vegetables. "No salt added" tomato sauce and paste. Low-sodium or reduced-sodium tomato and vegetable juice. Grains Low-sodium cereals, such as oats, puffed wheat and rice, and shredded wheat. Low-sodium crackers. Unsalted rice. Unsalted pasta. Low-sodium bread. Whole grain breads and whole grain pasta. Meats and other proteins Fresh or frozen meat, poultry, seafood, and fish. These should have no added salt. Low-sodium canned tuna and salmon. Unsalted nuts. Dried peas, beans, and lentils without added salt.  Unsalted canned beans. Eggs. Unsalted nut butters. Dairy Milk. Soy milk. Cheese that is naturally low in sodium, such as ricotta cheese, fresh mozzarella, or Swiss cheese. Low-sodium or reduced-sodium cheese. Cream cheese. Yogurt. Seasonings and condiments Fresh and dried herbs and spices. Salt-free seasonings. Low-sodium mustard and ketchup. Sodium-free salad dressing. Sodium-free light mayonnaise. Fresh or refrigerated horseradish. Lemon juice. Vinegar. Other foods Homemade, reduced-sodium, or low-sodium soups. Unsalted popcorn and  pretzels. Low-salt or salt-free chips. The items listed above may not be all the foods and drinks you can have. Talk to a dietitian to learn more. What foods should I avoid? Vegetables Sauerkraut, pickled vegetables, and relishes. Olives. [place] fries. Onion rings. Regular canned vegetables, except low-sodium or reduced-sodium items. Regular canned tomato sauce and paste. Regular tomato and vegetable juice. Frozen vegetables in sauces. Grains Instant hot cereals. Bread stuffing, pancake, and biscuit mixes. Croutons. Seasoned rice or pasta mixes. Noodle soup cups. Boxed or frozen macaroni and cheese. Regular salted crackers. Self-rising flour. Meats and other proteins Meat or fish that is salted, canned, smoked, spiced, or pickled. Precooked or cured meat, such as sausages or meat loaves. Tomasa Blase. Ham. Pepperoni. Hot dogs. Corned beef. Chipped beef. Salt pork. Jerky. Pickled herring, anchovies, and sardines. Regular canned tuna. Salted nuts. Dairy Processed cheese and cheese spreads. Hard cheeses. Cheese curds. Blue cheese. Feta cheese. String cheese. Regular cottage cheese. Buttermilk. Canned milk. Fats and oils Salted butter. Regular margarine. Ghee. Bacon fat. Seasonings and condiments Onion salt, garlic salt, seasoned salt, table salt, and sea salt. Canned and packaged gravies. Worcestershire sauce. Tartar sauce. Barbecue sauce. Teriyaki sauce. Soy sauce, including reduced-sodium soy sauce. Steak sauce. Fish sauce. Oyster sauce. Cocktail sauce. Horseradish that you find on the shelf. Regular ketchup and mustard. Meat flavorings and tenderizers. Bouillon cubes. Hot sauce. Pre-made or packaged marinades. Pre-made or packaged taco seasonings. Relishes. Regular salad dressings. Salsa. Other foods Salted popcorn and pretzels. Corn chips and puffs. Potato and tortilla chips. Canned or dried soups. Pizza. Frozen entrees and pot pies. The items listed above may not be all the foods and drinks you should  avoid. Talk to a dietitian to learn more. This information is not intended to replace advice given to you by your health care provider. Make sure you discuss any questions you have with your health care provider. Document Revised: 03/22/2022 Document Reviewed: 03/22/2022 Elsevier Patient Education  2024 [employer].

## 2022-12-14 NOTE — Progress Notes (Signed)
Cardiology Office Note:  .   Date:  12/14/2022  ID:  [name], DOB 05/07/1950, MRN [id number] PCP: Thana Ates, MD  [facility] Providers Cardiologist:  Lance Muss, MD {  History of Present Illness: .   [name] is a 72 y.o. female with a past medical history of SVT with ablation by Dr. Ladona Ridgel in 2015 here for follow-up appointment.  Monitor in 2020 showed PVCs and PACs with NS atrial tachycardia.  Seen the emergency room April 2024 with history of SVT status post ablation who was present in the ED due to feeling odd and a little bit dizzy for last 2 weeks.  States it was worse when she stands up and she gets a little bit more dizzy.  Concerned because she was very fatigued the day before.  Noticed her heart rate is in the 40s.  She is having some left-sided arm pain as well that is typically achy and not worse with movement.  Denies chest pain or shortness of breath.  No nausea or vomiting.  Feels like her heart races occasionally and then slows down.  She was in sinus bradycardia at that time with her heart rate in the high 50s.  Of note, patient is very active and runs several times a week.  Walks up the stairs.  Usually does not have any cardiac symptoms with this.  Has some palpitations.   Today, she says that she is now on Eliquis and she tells me that it is very expensive over $300 a month.  She would like to explore other options.  We discussed Xarelto and Coumadin as alternatives.  She requested meeting with Pharm.D. to review.  She still remains very active although is not running anymore.  She states she has had some episodes of what she thinks is A-fib or heart beats fast for few seconds and then stops.  No prolonged episodes of A-fib.  Unfortunately, we cannot start a beta-blocker today because heart rates have been in the high 50s low 60s and blood pressure is low normal, 102/72.  I do not think she would be a candidate for DCCV since she is not in A-fib  frequently.  We will arrange follow-up with Dr. Ladona Ridgel to discuss.  Reports no shortness of breath nor dyspnea on exertion. Reports no chest pain, pressure, or tightness. No edema, orthopnea, PND.    ROS: Pertinent ROS in HPI  Studies Reviewed: Marland Kitchen       Coronary CT scan 10/05/2022 Narrative & Impression  CLINICAL DATA:  Cardiovascular Disease Risk stratification   EXAM: Coronary Calcium Score   TECHNIQUE: A gated, non-contrast computed tomography scan of the heart was performed using 3mm slice thickness. Axial images were analyzed on a dedicated workstation. Calcium scoring of the coronary arteries was performed using the Agatston method.   FINDINGS: Coronary arteries: Normal origins.   Coronary Calcium Score:   Left main:   Left anterior descending artery:   Left circumflex artery:   Right coronary artery:   Total: 0   Percentile:   Pericardium: Trivial effusion   Ascending Aorta: Normal caliber.   Non-cardiac: See separate report from [facility].   IMPRESSION: Coronary calcium score of 0.   RECOMMENDATIONS: Coronary artery calcium (CAC) score is a strong predictor of incident coronary heart disease (CHD) and provides predictive information beyond traditional risk factors. CAC scoring is reasonable to use in the decision to withhold, postpone, or initiate statin therapy in intermediate-risk or selected borderline-risk  asymptomatic adults (age 45-75 years and LDL-C >=70 to <190 mg/dL) who do not have diabetes or established atherosclerotic cardiovascular disease (ASCVD).* In intermediate-risk (10-year ASCVD risk >=7.5% to <20%) adults or selected borderline-risk (10-year ASCVD risk >=5% to <7.5%) adults in whom a CAC score is measured for the purpose of making a treatment decision the following recommendations have been made:   If CAC=0, it is reasonable to withhold statin therapy and reassess in 5 to 10 years, as long as higher risk conditions  are absent (diabetes mellitus, family history of premature CHD in first degree relatives (males <55 years; females <65 years), cigarette smoking, or LDL >=190 mg/dL).   If CAC is 1 to 99, it is reasonable to initiate statin therapy for patients >=17 years of age.   If CAC is >=100 or >=75th percentile, it is reasonable to initiate statin therapy at any age.   Cardiology referral should be considered for patients with CAC scores >=400 or >=75th percentile.   *2018 AHA/ACC/AACVPR/AAPA/ABC/ACPM/ADA/AGS/APhA/ASPC/NLA/PCNA Guideline on the Management of Blood Cholesterol: A Report of the American College of Cardiology/American Heart Association Task Force on Clinical Practice Guidelines. J Am Coll Cardiol. 2019;73(24):3168-3209.   Caitlin Lesches, MD   Electronically Signed: By: [name] M.D. On: 10/05/2022 22:46     Risk Assessment/Calculations:    CHA2DS2-VASc Score = 2   This indicates a 2.2% annual risk of stroke. The patient's score is based upon: CHF History: 0 HTN History: 0 Diabetes History: 0 Stroke History: 0 Vascular Disease History: 0 Age Score: 1 Gender Score: 1             Physical Exam:   VS:  BP 102/72   Pulse 70   Ht 5\' 4"  (1.626 m)   Wt 111 lb 9.6 oz (50.6 kg)   SpO2 97%   BMI 19.16 kg/m    Wt Readings from Last 3 Encounters:  12/14/22 111 lb 9.6 oz (50.6 kg)  10/10/22 110 lb (49.9 kg)  09/13/22 111 lb 6.4 oz (50.5 kg)    GEN: Well nourished, well developed in no acute distress NECK: No JVD; No carotid bruits CARDIAC: RRR, no murmurs, rubs, gallops RESPIRATORY:  Clear to auscultation without rales, wheezing or rhonchi  ABDOMEN: Soft, non-tender, non-distended EXTREMITIES:  No edema; No deformity   ASSESSMENT AND PLAN: .   1.  SVT status post ablation -no issues since the ablation  -Continue current medication regimen  2.  Paroxysmal A-fib -Continue Eliquis 5 mg twice a day -Send a message to Pharm.D. to explore other  options such as Xarelto or Coumadin for anticoagulation -She sees Dr. Ladona Ridgel and we have set up a follow-up appointment -CHA2DS2-VASc score of 2 (2.2% chance of stroke)      Dispo: She can follow-up with Dr. Ladona Ridgel and Pharm.D.  Signed, Sharlene Dory, PA-C

## 2022-12-28 ENCOUNTER — Ambulatory Visit: Payer: Medicare Other | Attending: Physician Assistant

## 2022-12-28 ENCOUNTER — Telehealth: Payer: Self-pay | Admitting: Pharmacist

## 2022-12-28 ENCOUNTER — Ambulatory Visit: Payer: Medicare Other

## 2022-12-28 ENCOUNTER — Other Ambulatory Visit: Payer: Self-pay | Admitting: *Deleted

## 2022-12-28 DIAGNOSIS — I480 Paroxysmal atrial fibrillation: Secondary | ICD-10-CM

## 2022-12-28 NOTE — Progress Notes (Deleted)
Eliquis: You can apply for patient assistance through [employer] patient assistance. The website for the application is [email or web address] The website can tell you if you meet the income requirement. Note to Medicare Patients: In addition to your application, you will need to submit documentation showing you have spent 3% of your annual household income on out-of-pocket prescription expenses for you and/or other members of your household for the year in which you are seeking assistance. Your pharmacy can provide this report.  Xarelto: Xarelto is a medication similar to Eliquis, but it is taken just once a day. The drug company offers a program called Xarelto with Me Coverage Gap Support. It runs from April 1-December 31. If you are in the coverage gap, you can get Xarelto for $89 (for 30 days) or $250 (for 90 days), plus sales tax if applicable. You can call to enroll at: 888-XARELTO 480-760-9816)  Dabigatran: Dabigatran is the generic of Pradaxa (it recently went generic). Like Eliquis and Xarelto, it does not require a lot of monitoring. You might consider contacting your insurance to see what the cost of this medication is through your insurance. There is also a GoodRx coupon for ~$69-75 per month if your insurance does not cover it well.   Warfarin: Warfarin is very inexpensive but does require more monitoring. You will need to come to clinic for weekly INR (blood test) checks with our Anticoagulation Clinic for the first month. These visits may have a copay. Then as we find a stable dose for you, you will start to come less often. You will be required to have your INR checked at least every 6 weeks. There are also more drug interactions and dietary interactions with warfarin.

## 2022-12-28 NOTE — Telephone Encounter (Signed)
Patient was on PharmD schedule for today at 3:30 to discuss Eliquis alternatives.  Patient has 70 years old grand son who she looks after so we discussed the alternatives over the phone.  She thinks Xarelto would be good as the support program lower the medication cost to ~ $89 and that is due able. Still has 1 month worth of Eliquis and will be seeing Dr.Taylor early Nov. Will ask him to change his DOAC.   Patient requesting discussed alteratives information via MyChart

## 2022-12-28 NOTE — Progress Notes (Unsigned)
Enrolled patient for a 14 day Zio XT monitor to be mailed to patients home home   [name] to read

## 2023-01-02 DIAGNOSIS — I480 Paroxysmal atrial fibrillation: Secondary | ICD-10-CM

## 2023-01-21 ENCOUNTER — Ambulatory Visit: Payer: Medicare Other | Admitting: Internal Medicine

## 2023-02-19 ENCOUNTER — Encounter: Payer: Self-pay | Admitting: Internal Medicine

## 2023-02-19 ENCOUNTER — Ambulatory Visit: Payer: Medicare Other | Attending: Internal Medicine | Admitting: Internal Medicine

## 2023-02-19 VITALS — BP 112/76 | HR 68 | Ht 63.5 in | Wt 115.0 lb

## 2023-02-19 DIAGNOSIS — I480 Paroxysmal atrial fibrillation: Secondary | ICD-10-CM | POA: Insufficient documentation

## 2023-02-19 NOTE — Patient Instructions (Addendum)
Medication Instructions:  Your physician has recommended you make the following change in your medication:  Stop eliquis.  Lab Work: None ordered.  If you have labs (blood work) drawn today and your tests are completely normal, you will receive your results only by: MyChart Message (if you have MyChart) OR A paper copy in the mail If you have any lab test that is abnormal or we need to change your treatment, we will call you to review the results.  Testing/Procedures: None ordered.  Follow-Up: At [facility], you and your health needs are our priority.  As part of our continuing mission to provide you with exceptional heart care, we have created designated Provider Care Teams.  These Care Teams include your primary Cardiologist (physician) and Advanced Practice Providers (APPs -  Physician Assistants and Nurse Practitioners) who all work together to provide you with the care you need, when you need it.   Your next appointment:   1 year(s)  The format for your next appointment:   In Person  Provider:   Lewayne Bunting, MD{or one of the following Advanced Practice Providers on your designated Care Team:   Francis Dowse, [place] Casimiro Needle "Mardelle Matte" [place], [place] Earnest Rosier, NP   Important Information About Sugar

## 2023-02-19 NOTE — Progress Notes (Signed)
HPI Mrs. Asamoah returns today for followup. She is a pleasant 72 yo woman with a h/o SVT who then developed recurrent palpitations. I saw her over 7 months ago and she wore a cardiac monitor which demonstrated PVC's and PAC's, and NS atrial tachycardia. She feels well and is exercising regularly. No anginal symptoms. She was found to have atrial fib which spontaneously reverted. She was placed on eliquis though she only has female and age 55 as her risk. She note recurrent palpitations which last seconds. She did not know she was in atrial fib when it was diagnosed in June.     Allergies  Allergen Reactions   Citalopram Nausea Only   Paroxetine     anorgasmia   Venlafaxine Diarrhea     Current Outpatient Medications  Medication Sig Dispense Refill   alendronate (FOSAMAX) 70 MG tablet Take 70 mg by mouth once a week.     apixaban (ELIQUIS) 5 MG TABS tablet Take 1 tablet (5 mg total) by mouth 2 (two) times daily. 60 tablet 6   Cholecalciferol (VITAMIN D-3) 125 MCG (5000 UT) TABS Take 5,000 Units by mouth daily.     cloNIDine (CATAPRES) 0.1 MG tablet Take 0.2 mg by mouth at bedtime.     [employer] SUNSCREEN SPF 30 EX [employer] progest cream once daily     gabapentin (NEURONTIN) 300 MG capsule Take 600 mg by mouth at bedtime.     MAGNESIUM PO Take 1 tablet by mouth daily. magnesium l-threonate     mirtazapine (REMERON) 15 MG tablet Take 15 mg by mouth at bedtime.     Multiple Vitamin (MULTIVITAMIN WITH MINERALS) TABS tablet Take 1 tablet by mouth daily.     valACYclovir (VALTREX) 500 MG tablet Take 500 mg by mouth 2 (two) times daily as needed (Breakouts).  0   zolpidem (AMBIEN CR) 12.5 MG CR tablet Take 12.5 mg by mouth at bedtime.     No current facility-administered medications for this visit.     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   Bunion    Cataract    mild    Colon polyps    Dysrhythmia 07/2013   SVT   Enthesopathy of ankle and tarsus, unspecified    Esophageal reflux    Family history of breast cancer    Family history of lung cancer    Family history of malignant neoplasm of gastrointestinal tract    Family history of ovarian cancer    Hiatal hernia    Irritable bowel syndrome    Monoallelic mutation of CDKN2A gene    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HCC)    Other acquired calcaneus deformity    Personal history of colonic polyps 2002   hyperplastic   Personal history of malignant melanoma    Personal history of radiation therapy    Personal history of squamous cell carcinoma of skin    Plantar fascial fibromatosis    Rotator cuff syndrome of shoulder and allied disorders    Skin cancer    Syncope     ROS:   All systems reviewed and negative except as noted in the HPI.   Past Surgical History:  Procedure Laterality Date   ABLATION OF DYSRHYTHMIC FOCUS  07/31/2013   DR Ladona Ridgel  ARTHROPLASTY  2006   thumb/wrist    AUGMENTATION MAMMAPLASTY Bilateral    BRAVO PH STUDY  02/01/2011   Procedure: BRAVO PH STUDY;  Surgeon: Louis Meckel, MD;  Location: [facility];  Service: Endoscopy;  Laterality: N/A;   BREAST BIOPSY Left 09/2020   2023   BREAST BIOPSY Right 06/20/2021   BREAST BIOPSY Right 05/22/2022   [place] RT BREAST BX W LOC DEV 1ST LESION IMG BX SPEC US GUIDE 05/22/2022 [facility]   BREAST EXCISIONAL BIOPSY Left 10/2020   BREAST LUMPECTOMY Right 07/2021   BREAST LUMPECTOMY WITH RADIOACTIVE SEED LOCALIZATION Left 11/03/2020   Procedure: LEFT BREAST LUMPECTOMY WITH RADIOACTIVE SEED LOCALIZATION;  Surgeon: Harriette Bouillon, MD;  Location: [facility];  Service: General;  Laterality: Left;   BREAST LUMPECTOMY WITH RADIOACTIVE SEED LOCALIZATION Right 07/19/2021   Procedure: RIGHT BREAST LUMPECTOMY WITH RADIOACTIVE SEED LOCALIZATION;  Surgeon: Harriette Bouillon, MD;  Location:  [facility];  Service: General;  Laterality: Right;   BUNIONECTOMY     CESAREAN SECTION     x3   COLONOSCOPY  07/16/2012   COLONOSCOPY WITH PROPOFOL N/A 10/10/2022   Procedure: COLONOSCOPY WITH PROPOFOL;  Surgeon: Lemar Lofty., MD;  Location: [facility];  Service: Gastroenterology;  Laterality: N/A;   ESOPHAGOGASTRODUODENOSCOPY  02/01/2011   ESOPHAGOGASTRODUODENOSCOPY (EGD) WITH PROPOFOL N/A 12/12/2020   Procedure: ESOPHAGOGASTRODUODENOSCOPY (EGD) WITH PROPOFOL;  Surgeon: Meridee Score Netty Starring., MD;  Location: [facility];  Service: Gastroenterology;  Laterality: N/A;   ESOPHAGOGASTRODUODENOSCOPY (EGD) WITH PROPOFOL N/A 10/10/2022   Procedure: ESOPHAGOGASTRODUODENOSCOPY (EGD) WITH PROPOFOL;  Surgeon: Meridee Score Netty Starring., MD;  Location: [facility];  Service: Gastroenterology;  Laterality: N/A;   EUS N/A 12/12/2020   Procedure: UPPER ENDOSCOPIC ULTRASOUND (EUS) RADIAL;  Surgeon: Lemar Lofty., MD;  Location: [facility];  Service: Gastroenterology;  Laterality: N/A;   EUS N/A 10/10/2022   Procedure: UPPER ENDOSCOPIC ULTRASOUND (EUS) RADIAL;  Surgeon: Lemar Lofty., MD;  Location: [facility];  Service: Gastroenterology;  Laterality: N/A;   PARTIAL HYSTERECTOMY     Still has ovaries   PLACEMENT OF BREAST IMPLANTS     POLYPECTOMY     POLYPECTOMY  10/10/2022   Procedure: POLYPECTOMY;  Surgeon: Mansouraty, Netty Starring., MD;  Location: [facility];  Service: Gastroenterology;;   Gaspar Bidding DILATION N/A 12/12/2020   Procedure: Gaspar Bidding DILATION;  Surgeon: Lemar Lofty., MD;  Location: [facility];  Service: Gastroenterology;  Laterality: N/A;   SHOULDER SURGERY     SUPRAVENTRICULAR TACHYCARDIA ABLATION N/A 07/31/2013   Procedure: SUPRAVENTRICULAR TACHYCARDIA ABLATION;  Surgeon: Marinus Maw, MD;  Location: [facility];  Service: Cardiovascular;  Laterality: N/A;     Family History  Problem Relation Age of Onset   Colon cancer Father  30   Heart disease Mother    Breast cancer Maternal Aunt        dx 20s   Lung cancer Maternal Aunt        dx 14s, smoker   Ovarian cancer Cousin        dx 61s, maternal first cousin   Lung cancer Maternal Grandfather        dx 2s, smoker   Colon cancer Maternal Uncle        dx 82s   Lung cancer Other        MGF's brother   Colon cancer Nephew 50   Dementia Maternal Aunt    Breast cancer Cousin        dx 3s, maternal first cousin  Ovarian cancer Cousin 26       dysgerminoma, maternal first cousin   Lung cancer Other        MGF's brother   Colon polyps Neg Hx    Esophageal cancer Neg Hx    Rectal cancer Neg Hx    Stomach cancer Neg Hx      Social History   Socioeconomic History   Marital status: Divorced    Spouse name: Not on file   Number of children: 3   Years of education: Not on file   Highest education level: Not on file  Occupational History   Occupation: Engineer, building services    Employer: [employer]  Tobacco Use   Smoking status: Former    Current packs/day: 0.00    Types: Cigarettes    Quit date: 03/19/1978    Years since quitting: 44.9   Smokeless tobacco: Never  Vaping Use   Vaping status: Never Used  Substance and Sexual Activity   Alcohol use: Not Currently    Comment: rarely- twice a yr if that    Drug use: No   Sexual activity: Not Currently    Birth control/protection: Surgical    Comment: partial hyst  Other Topics Concern   Not on file  Social History Narrative   Not on file   Social Determinants of Health   Financial Resource Strain: Not on file  Food Insecurity: Not on file  Transportation Needs: Not on file  Physical Activity: Not on file  Stress: Not on file  Social Connections: Not on file  Intimate Partner Violence: Not on file     BP 112/76   Pulse 68   Ht 5' 3.5" (1.613 m)   Wt 115 lb (52.2 kg)   SpO2 97%   BMI 20.05 kg/m   Physical Exam:  Well appearing NAD HEENT: Unremarkable Neck:  No JVD, no  thyromegally Lymphatics:  No adenopathy Back:  No CVA tenderness Lungs:  Clear with no wheezes HEART:  Regular rate rhythm, no murmurs, no rubs, no clicks Abd:  soft, positive bowel sounds, no organomegally, no rebound, no guarding Ext:  2 plus pulses, no edema, no cyanosis, no clubbing Skin:  No rashes no nodules Neuro:  CN II through XII intact, motor grossly intact  DEVICE  Normal device function.  See PaceArt for details.   Assess/Plan:  1. SVT - she has had no recurrence s/p catheter ablation.  2. Palpitations - she has PAC's and PVC's. I discussed the benign nature as well as treatment options if she becomes bothered. I will see her back as needed.  3. PAF - I stopped her eliquis today. She will need it restarted when she turns 77 or if she develops HTN or DM. She has a calcium score of 0.    Leonia Reeves.D.

## 2023-05-20 ENCOUNTER — Other Ambulatory Visit: Payer: Self-pay | Admitting: Obstetrics and Gynecology

## 2023-05-20 DIAGNOSIS — N6310 Unspecified lump in the right breast, unspecified quadrant: Secondary | ICD-10-CM

## 2023-06-12 ENCOUNTER — Ambulatory Visit
Admission: RE | Admit: 2023-06-12 | Discharge: 2023-06-12 | Disposition: A | Discharge status: Home / Self Care | Source: Ambulatory Visit | Attending: Obstetrics and Gynecology | Admitting: Obstetrics and Gynecology

## 2023-06-12 DIAGNOSIS — N6310 Unspecified lump in the right breast, unspecified quadrant: Secondary | ICD-10-CM

## 2023-10-04 IMAGING — MG MM BREAST LOCALIZATION CLIP
4 series · 4 of 12 positions shown · non-contrast
Comparison: Previous exam(s).

CLINICAL DATA: Status post ultrasound guided biopsy of a mass in
the 4 o'clock region of the left breast

EXAM:
3D DIAGNOSTIC LEFT MAMMOGRAM POST ULTRASOUND BIOPSY

[L ML synth-2D]
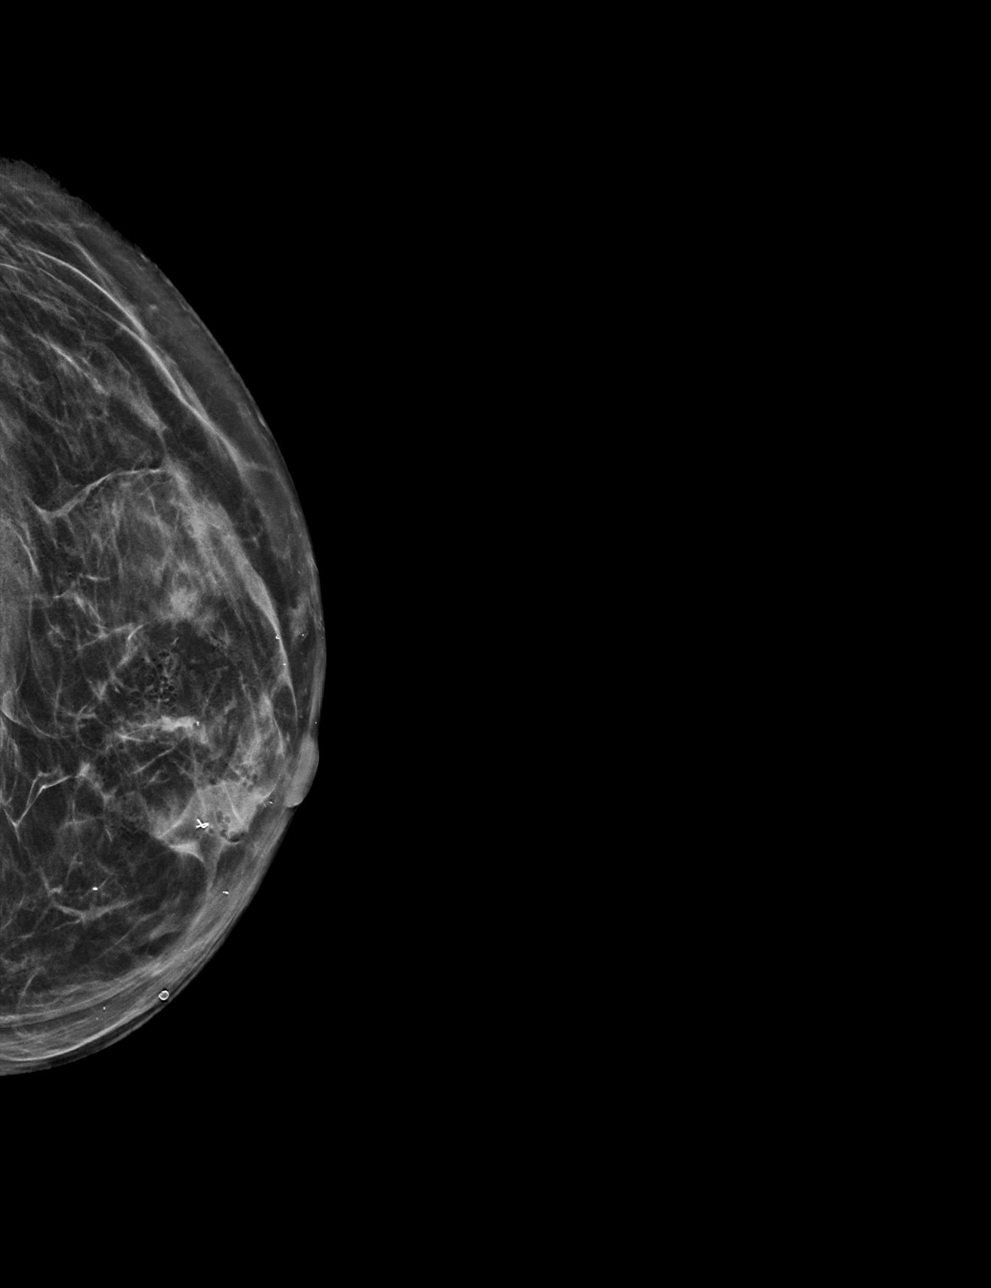

[L CC synth-2D]
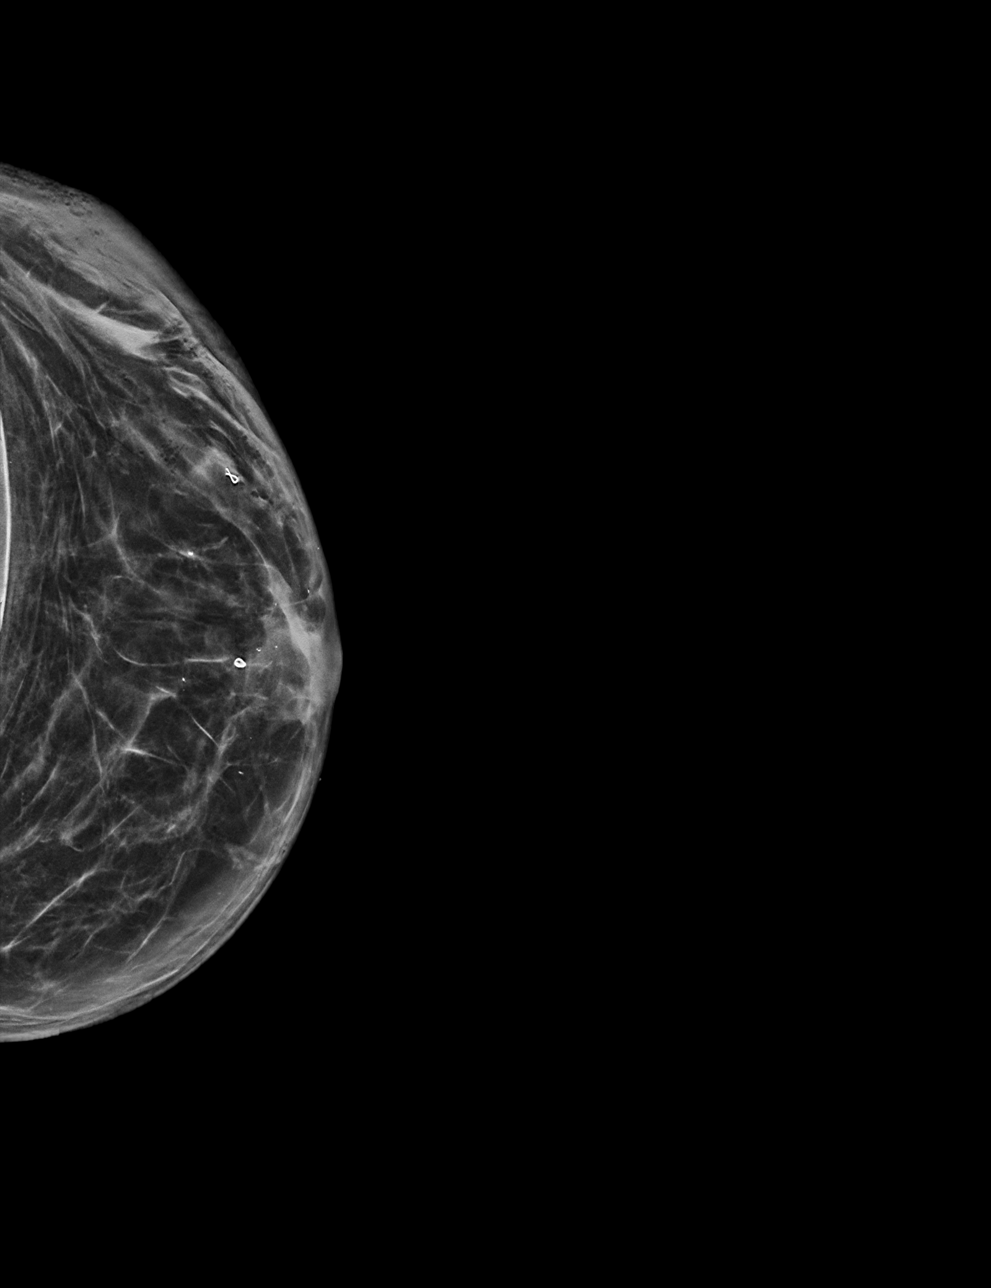

[L ML tomo · tomo slice 28/55.0]
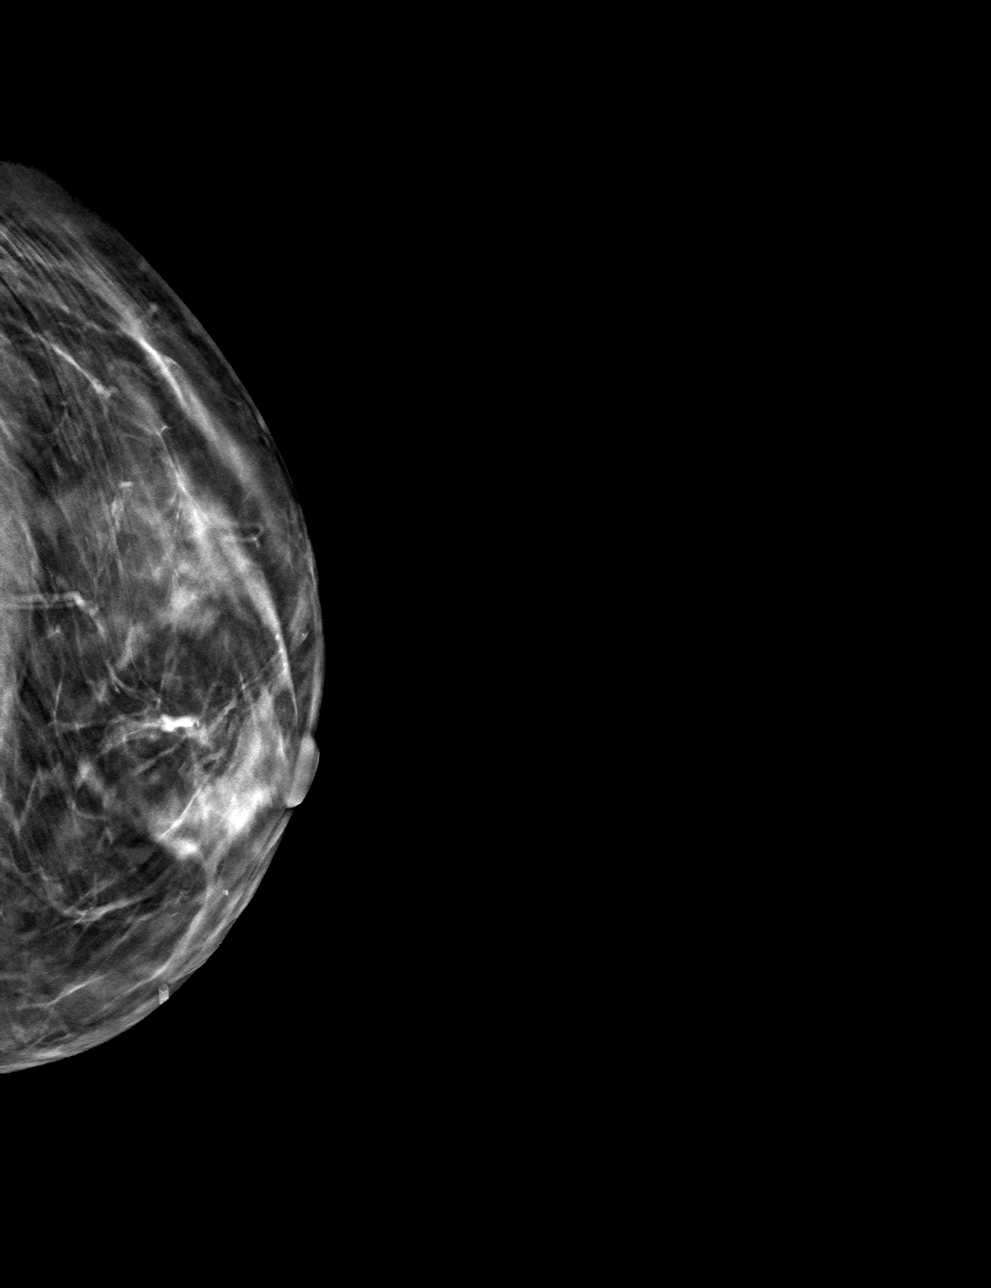

[L CC tomo · tomo slice 29/56.0]
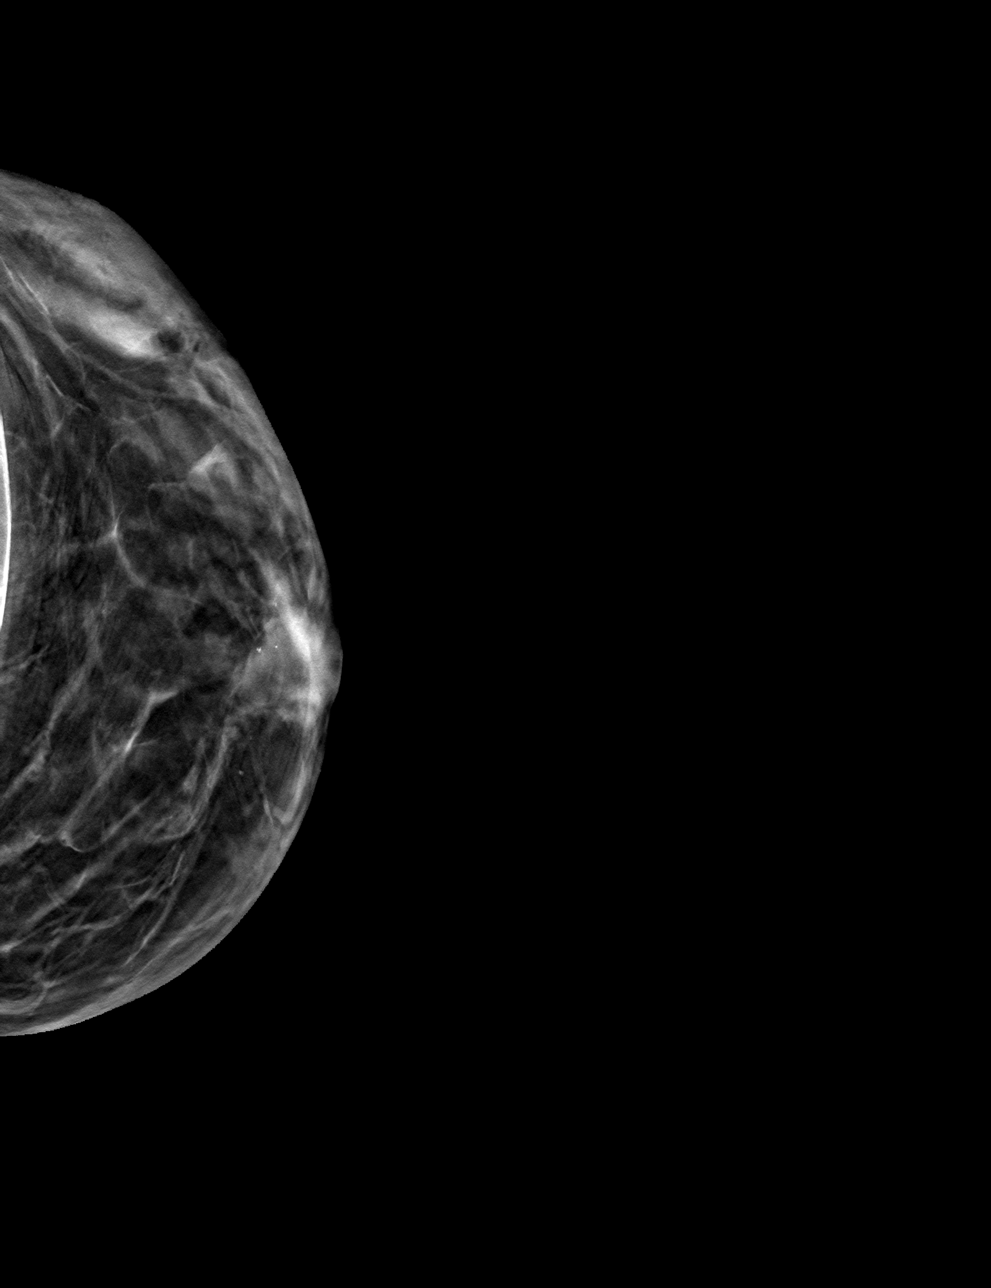

[4 of 12 positions shown; findings below may reference images not displayed]

FINDINGS: 3D Mammographic images were obtained following ultrasound guided
biopsy of a mass in the 4 o'clock region of the left breast. The
biopsy marking clip is in expected location in the lower outer
quadrant of the left breast.
IMPRESSION: Appropriate positioning of the ribbon shaped biopsy marking clip at
the site of biopsy in the lower outer quadrant of the left breast.

Final Assessment: Post Procedure Mammograms for Marker Placement

## 2023-10-04 IMAGING — US US BREAST BX W LOC DEV 1ST LESION IMG BX SPEC US GUIDE*L*
1 series · 12 of 17 positions shown · non-contrast
Comparison: Previous exam(s).
COMPARISON: Previous exam(s).

Addendum:
CLINICAL DATA: Ultrasound-guided aspiration of a mass in the 4
o'clock region of the left breast 1 cm from the nipple was
recommended. Aspiration was attempted but no fluid could be obtained
therefore biopsy was performed.

EXAM:
ULTRASOUND GUIDED LEFT BREAST CORE NEEDLE BIOPSY

[Series 1: us breast bx w loc dev 1st lesion img bx spec us g · 0.02mm/px · 12 of 17 slices shown]
[im 1/17]
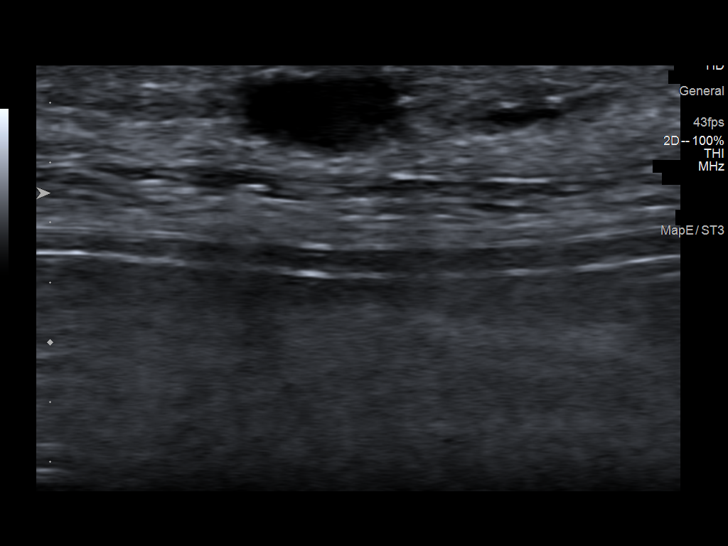
[im 3/17]
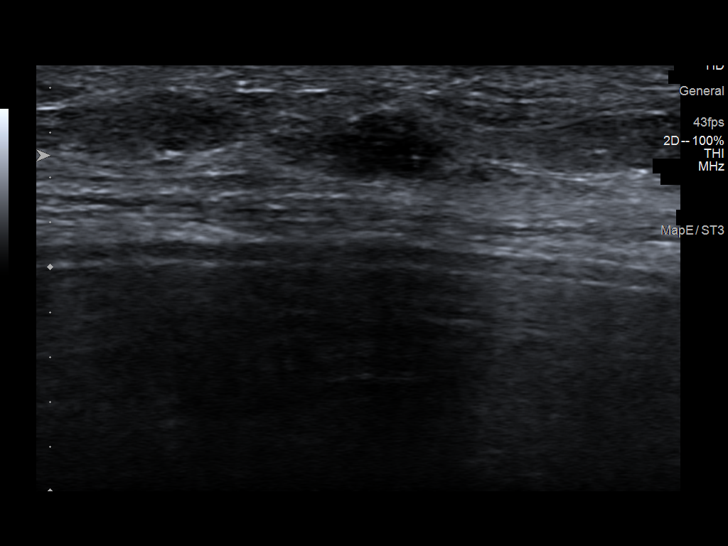
[im 4/17]
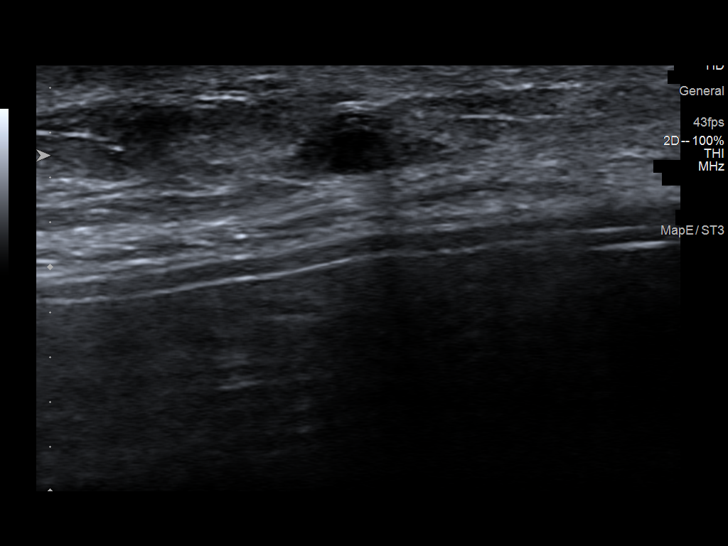
[im 5/17]
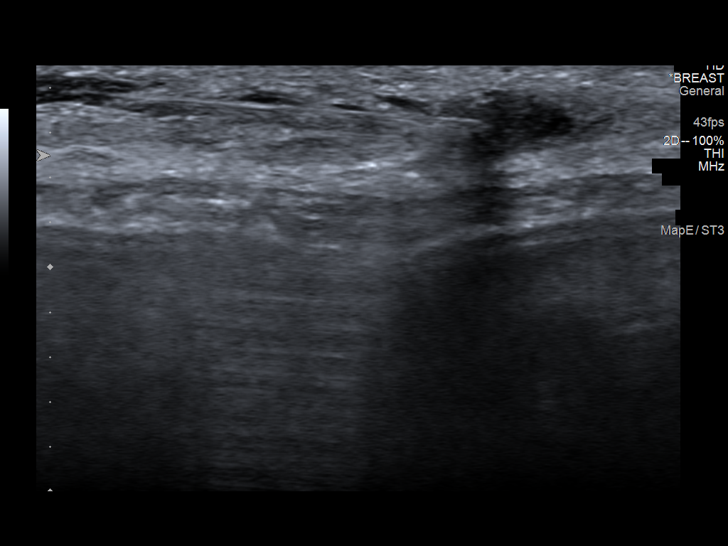
[im 7/17]
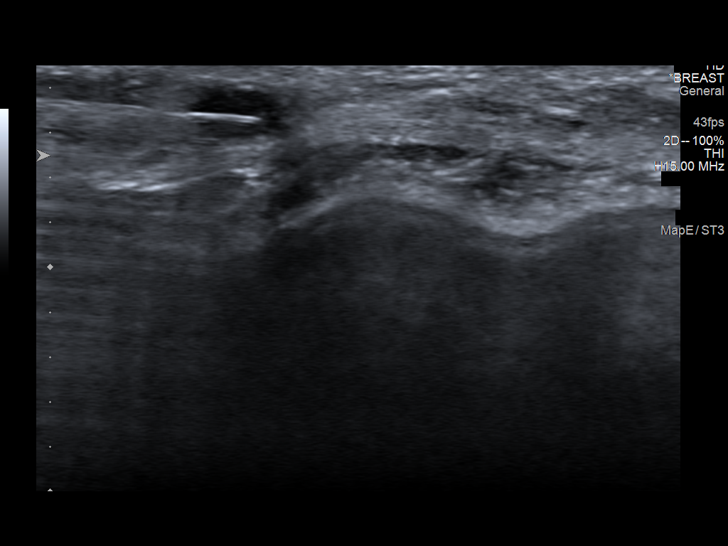
[im 8/17]
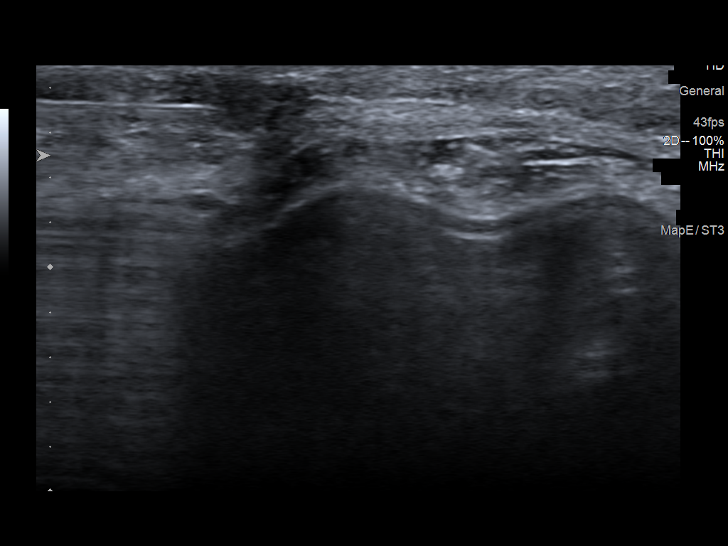
[im 10/17]
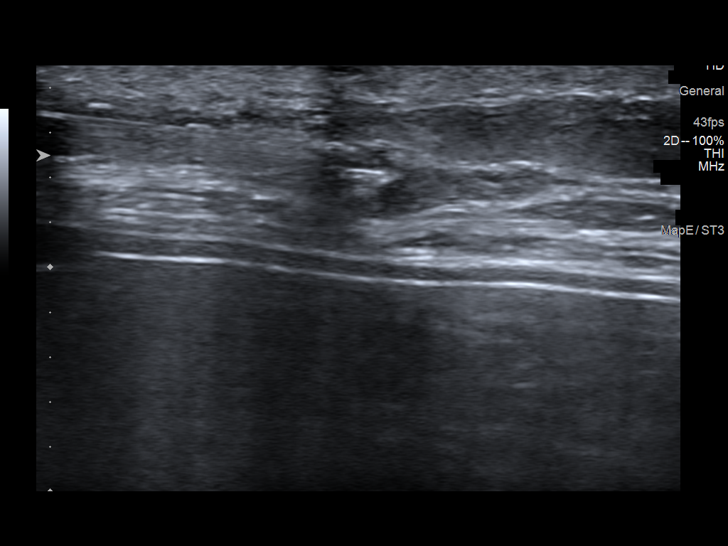
[im 11/17]
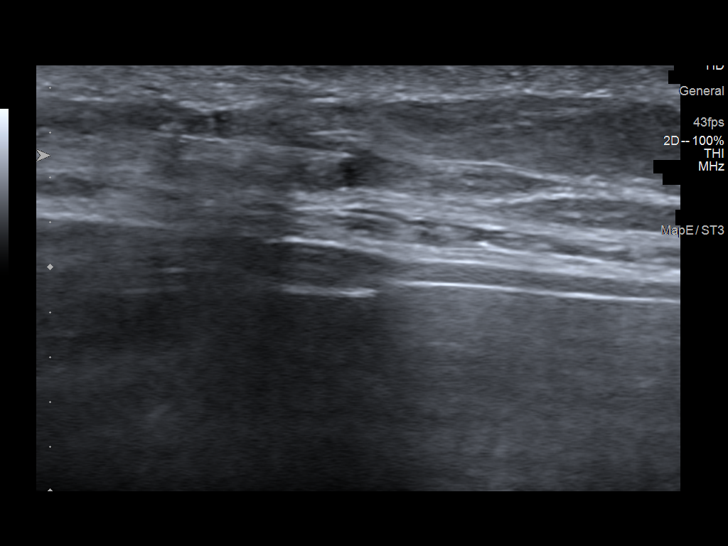
[im 13/17]
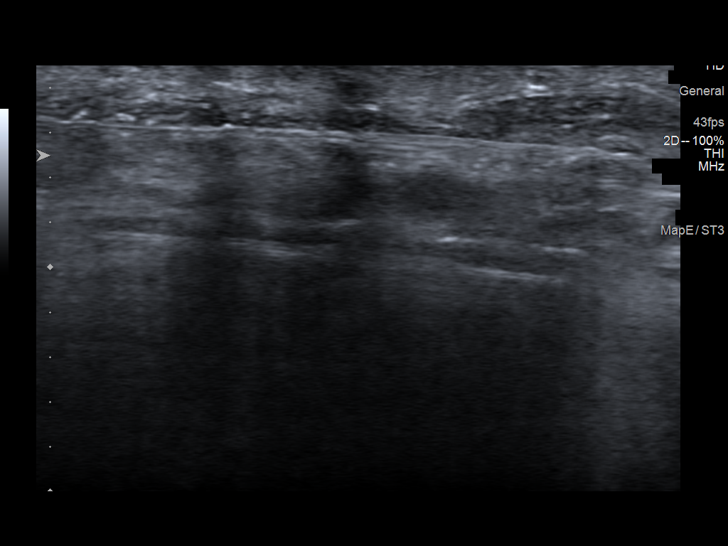
[im 14/17]
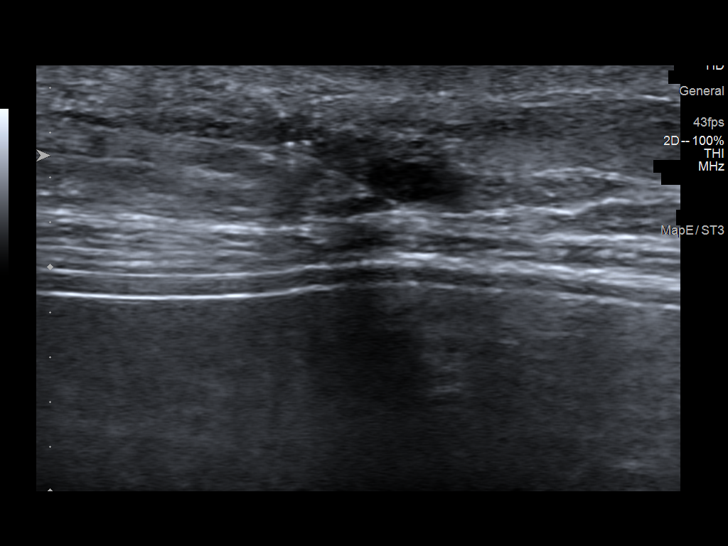
[im 15/17]
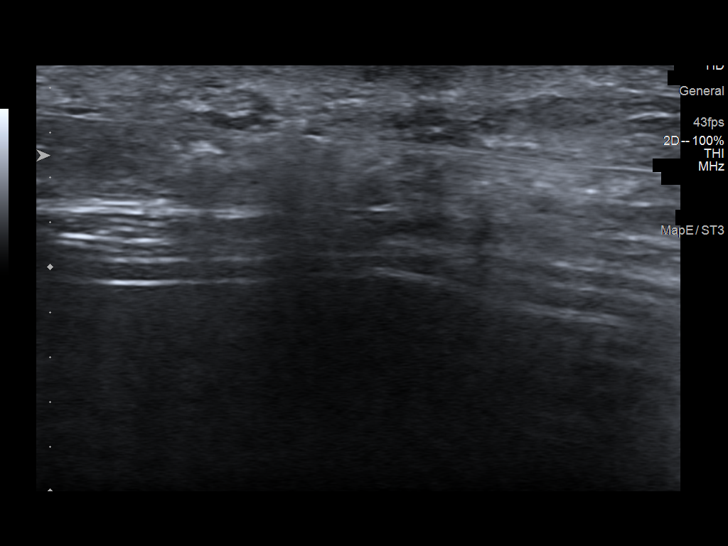
[im 17/17]
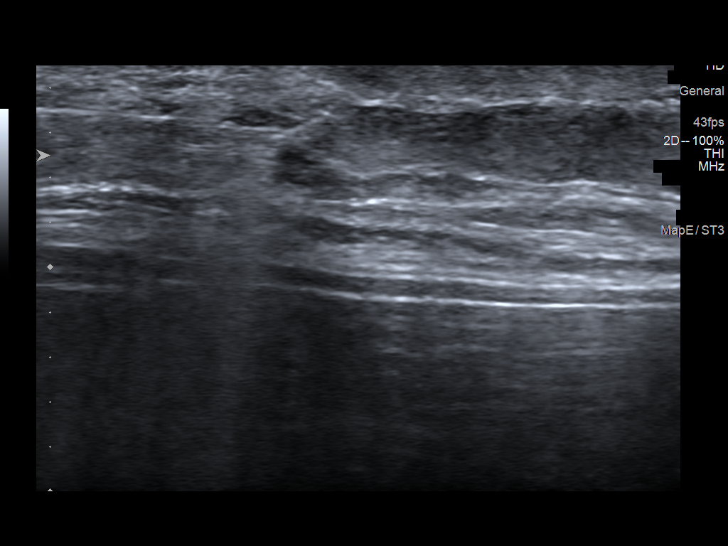

[12 of 17 positions shown; findings below may reference images not displayed]



Lesion quadrant: 4 o'clock 1 cm from the nipple/lower outer quadrant

Using sterile technique and 1% Lidocaine as local anesthetic, under
direct ultrasound visualization, a 14 gauge Keoni device was
used to perform biopsy of a mass in the 4 o'clock region of the left
breast 1 cm from the nipple using a lateral to medial approach. At
the conclusion of the procedure ribbon shaped tissue marker clip was
deployed into the biopsy cavity. Follow up 2 view mammogram was
performed and dictated separately.
IMPRESSION: Ultrasound guided biopsy of the left breast. No apparent
complications.

ADDENDUM:
Pathology revealed FIBROCYSTIC CHANGES WITH USUAL DUCTAL
HYPERPLASIA, PSEUDOANGIOMATOUS STROMAL HYPERPLASIA (PASH) of the
LEFT breast, 4 o'clock 1 cmfn, (. This was found to be concordant by
Dr. Aytc Tkn.

Pathology results were discussed with the patient by telephone. The
patient reported doing well after the biopsy with minimal tenderness
at the site. Post biopsy instructions and care were reviewed and
questions were answered. The patient was encouraged to call The
direct phone number was provided.

The patient was asked to return for a LEFT ultrasound in 6 months
and informed a reminder notice would be sent regarding this
appointment.

Pathology results reported by Fredrik Arzuaga, RN on 05/16/2021.



Lesion quadrant: 4 o'clock 1 cm from the nipple/lower outer quadrant

Using sterile technique and 1% Lidocaine as local anesthetic, under
direct ultrasound visualization, a 14 gauge Keoni device was
used to perform biopsy of a mass in the 4 o'clock region of the left
breast 1 cm from the nipple using a lateral to medial approach. At
the conclusion of the procedure ribbon shaped tissue marker clip was
deployed into the biopsy cavity. Follow up 2 view mammogram was
performed and dictated separately.
IMPRESSION: Ultrasound guided biopsy of the left breast. No apparent
complications.

## 2023-10-10 ENCOUNTER — Telehealth: Payer: Self-pay

## 2023-10-10 DIAGNOSIS — Z800 Family history of malignant neoplasm of digestive organs: Secondary | ICD-10-CM

## 2023-10-10 NOTE — Telephone Encounter (Signed)
-----   Message from Nurse Kimble Hitchens P sent at 10/10/2022  3:39 PM EDT ----- MRI/MRCP in 1 year for pancreas cancer screening

## 2023-10-10 NOTE — Telephone Encounter (Signed)
 Spoke with the pt and she agrees to the MRI MRCP   Order has been entered and sent to the schedulers

## 2023-10-18 ENCOUNTER — Ambulatory Visit (HOSPITAL_COMMUNITY)

## 2023-10-28 ENCOUNTER — Ambulatory Visit (HOSPITAL_COMMUNITY)
Admission: RE | Admit: 2023-10-28 | Discharge: 2023-10-28 | Disposition: A | Discharge status: Home / Self Care | Source: Ambulatory Visit | Attending: Gastroenterology | Admitting: Gastroenterology

## 2023-10-28 ENCOUNTER — Other Ambulatory Visit: Payer: Self-pay | Admitting: Gastroenterology

## 2023-10-28 DIAGNOSIS — Z800 Family history of malignant neoplasm of digestive organs: Secondary | ICD-10-CM | POA: Diagnosis present

## 2023-10-28 DIAGNOSIS — Z1289 Encounter for screening for malignant neoplasm of other sites: Secondary | ICD-10-CM | POA: Insufficient documentation

## 2023-10-28 MED ORDER — GADOBUTROL 1 MMOL/ML IV SOLN
5.0000 mL | Freq: Once | INTRAVENOUS | Status: AC | PRN
  Administered 2023-10-28 (×2): 5 mL via INTRAVENOUS

## 2023-11-02 ENCOUNTER — Ambulatory Visit: Payer: Self-pay | Admitting: Gastroenterology

## 2023-11-11 ENCOUNTER — Other Ambulatory Visit: Payer: Self-pay

## 2023-11-11 DIAGNOSIS — R799 Abnormal finding of blood chemistry, unspecified: Secondary | ICD-10-CM

## 2023-11-11 DIAGNOSIS — Z800 Family history of malignant neoplasm of digestive organs: Secondary | ICD-10-CM

## 2023-11-11 DIAGNOSIS — R932 Abnormal findings on diagnostic imaging of liver and biliary tract: Secondary | ICD-10-CM

## 2023-11-11 DIAGNOSIS — K639 Disease of intestine, unspecified: Secondary | ICD-10-CM

## 2023-11-11 NOTE — Progress Notes (Signed)
 Recall followup and EUS have been entered

## 2023-11-12 ENCOUNTER — Other Ambulatory Visit (INDEPENDENT_AMBULATORY_CARE_PROVIDER_SITE_OTHER)

## 2023-11-12 DIAGNOSIS — Z800 Family history of malignant neoplasm of digestive organs: Secondary | ICD-10-CM | POA: Diagnosis not present

## 2023-11-12 DIAGNOSIS — R799 Abnormal finding of blood chemistry, unspecified: Secondary | ICD-10-CM

## 2023-11-12 DIAGNOSIS — R932 Abnormal findings on diagnostic imaging of liver and biliary tract: Secondary | ICD-10-CM | POA: Diagnosis not present

## 2023-11-12 DIAGNOSIS — K639 Disease of intestine, unspecified: Secondary | ICD-10-CM

## 2023-11-12 LAB — COMPREHENSIVE METABOLIC PANEL WITH GFR
ALT: 24 U/L (ref 0–35)
AST: 27 U/L (ref 0–37)
Albumin: 4.4 g/dL (ref 3.5–5.2)
Alkaline Phosphatase: 59 U/L (ref 39–117)
BUN: 13 mg/dL (ref 6–23)
CO2: 27 meq/L (ref 19–32)
Calcium: 8.9 mg/dL (ref 8.4–10.5)
Chloride: 104 meq/L (ref 96–112)
Creatinine, Ser: 0.8 mg/dL (ref 0.40–1.20)
GFR: 74.28 mL/min (ref >60.00)
Glucose, Bld: 92 mg/dL (ref 70–99)
Potassium: 4.1 meq/L (ref 3.5–5.1)
Sodium: 140 meq/L (ref 135–145)
Total Bilirubin: 0.4 mg/dL (ref 0.2–1.2)
Total Protein: 6.9 g/dL (ref 6.0–8.3)

## 2023-11-19 ENCOUNTER — Ambulatory Visit: Payer: Self-pay | Admitting: Gastroenterology

## 2024-03-27 ENCOUNTER — Telehealth: Payer: Self-pay | Admitting: Gastroenterology

## 2024-03-27 NOTE — Telephone Encounter (Signed)
 Inbound call from patient stating she's needing pantoprazole  medication refilled, patient is scheduled for a follow up on 04/23/24 Please advise  Thank you

## 2024-03-30 ENCOUNTER — Other Ambulatory Visit: Payer: Self-pay

## 2024-03-30 MED ORDER — PANTOPRAZOLE SODIUM 40 MG PO TBEC: 40.0000 mg | DELAYED_RELEASE_TABLET | Freq: Two times a day (BID) | ORAL | 0 refills | Status: AC

## 2024-03-30 NOTE — Telephone Encounter (Signed)
 Refill for pantoprazole  sent to pharmacy

## 2024-04-23 ENCOUNTER — Encounter: Payer: Self-pay | Admitting: Gastroenterology

## 2024-04-23 ENCOUNTER — Ambulatory Visit: Admitting: Gastroenterology

## 2024-04-23 VITALS — BP 92/68 | HR 62 | Ht 63.5 in | Wt 120.0 lb

## 2024-04-23 DIAGNOSIS — Z800 Family history of malignant neoplasm of digestive organs: Secondary | ICD-10-CM | POA: Diagnosis not present

## 2024-04-23 DIAGNOSIS — R1310 Dysphagia, unspecified: Secondary | ICD-10-CM | POA: Diagnosis not present

## 2024-04-23 DIAGNOSIS — Z860101 Personal history of adenomatous and serrated colon polyps: Secondary | ICD-10-CM | POA: Diagnosis not present

## 2024-04-23 DIAGNOSIS — K219 Gastro-esophageal reflux disease without esophagitis: Secondary | ICD-10-CM

## 2024-04-23 NOTE — Progress Notes (Signed)
 [address]  QUIERA DIFFEE [id number] 1950/11/28   Chief Complaint: Medication refill  Referring Provider: Dwight Trula SQUIBB, MD Primary GI MD: Dr. Wilhelmenia  HPI: [name] is a 74 y.o. female with past medical history of anxiety, SVT, right breast cancer 2023, colon polyps, GERD, skin cancer, family history of breast, lung, colon, ovarian cancer who presents today for medication refill.    Here for refill of pantoprazole .  Last seen in office 09/2020 by Dr. Aneita.  [name] and is at higher risk for pancreatic cancer. She was evaluated by [facility] [address] genetic counseling in December 2021. MRI/ MRCP performed in December 2021 was unremarkable.  She has started the pancreatic screening protocol with MRI alternating with EUS.   She also has family history of colon cancer and receives 5-year interval screening colonoscopies.  GERD is managed with pantoprazole  40 mg daily.  Most recent MRI done in August 2025.  Due for EUS August 2026.   Discussed the use of AI scribe software for clinical note transcription with the patient, who gave verbal consent to proceed.  History of Present Illness [name] is a 74 year old female with gastroesophageal reflux disease and esophageal dysphagia who presents for medication refill.  Gastroesophageal Reflux Disease: - Symptoms present since her twenties - Generally well controlled with intermittent pantoprazole  use - Rare nocturnal reflux episodes, consistent with baseline - No new or worsening reflux symptoms  Esophageal Dysphagia: - Intermittent dysphagia, particularly with rice - Occasionally requires emesis for relief - Avoids meat due to concern for exacerbation - Symptoms persistent for approximately two years without progression - Upper endoscopy with endoscopic ultrasound performed in 2024, symptoms present at that time - Esophageal dilation offered but declined due to lack of perceived benefit and  post-procedural throat soreness - No worsening of dysphagia since 2024  Pancreatic Neoplasm Surveillance: - Undergoes alternating annual MRIs and endoscopic ultrasounds for surveillance - Amenable to current surveillance schedule  Colorectal Cancer Screening: - Regular colonoscopies every five years due to family history of colon cancer  Constitutional and Gastrointestinal Symptoms: - No abdominal pain - No changes in bowel movements - No hematochezia or melena - No unintentional weight loss - Weight remains stable  Neurologic and Cardiovascular Symptoms: - Episodes of extreme vertigo and occasional morning lightheadedness, associated with low blood pressure - Clonidine taken at night to aid sleep, may contribute to lower morning blood pressure - No current dizziness   Previous GI Procedures/Imaging   MRI/MRCP 10/28/2023 IMPRESSION: 1. Normal MRI appearance of the pancreas. No mass or suspicious contrast enhancement. 2. Hepatomegaly. 3. Fluid signal cystic lesion in the right adnexa, seen only on coronal sequences, measuring 4.6 cm. This is unchanged compared to prior CT dated 10/21/2022, stability reassuring for benign character. Recommend pelvic ultrasound in 6-12 months for continued surveillance if not otherwise imaged.  Colonoscopy 10/10/2022 - Hemorrhoids found on digital rectal exam.  - Tortuous colon.  - The examined portion of the ileum was normal.  - Extrinsic impression for subepithelial nodule noted within the cecum.  - One 12 mm polyp in the cecum, removed with a cold snare. Resected and retrieved.  - Diverticulosis in the recto- sigmoid colon and in the sigmoid colon.  - Normal mucosa in the entire examined colon otherwise.  - Non- bleeding non- thrombosed external and internal hemorrhoids. - Recall 3 years Path: A. COLON, POLYPECTOMY:  Tubular adenoma  Lymphoid aggregates  Negative for high-grade dysplasia and carcinoma  Upper EUS 10/10/2022 EGD  impression:  - No gross lesions in the entire esophagus. Z- line irregular, 38 cm from the incisors.  - 3 cm hiatal hernia.  - No gross lesions in the entire stomach.  - No gross lesions in the duodenal bulb, in the first portion of the duodenum and in the second portion of the duodenum.  - Normal major papilla.  EUS impression:  - There was no sign of significant pathology in the pancreatic head, genu of the pancreas, pancreatic body, pancreatic tail and uncinate process of the pancreas. There was very slight prominence of the pancreatic duct in the body and tail region but no evidence of significant abnormality otherwise. In comparison to my previous EUS from 2 years ago this is unchanged.  - There was no sign of significant pathology in the common bile duct and in the common hepatic duct.  - No malignant- appearing lymph nodes were visualized in the celiac region ( level 20) , peripancreatic region and porta hepatis region.   Upper EUS 12/12/2020 (screening for pancreatic neoplasm, dysphagia) EGD Impression:  - Tortuous esophagus. No gross mucosal lesions in esophagus. Dilated with mucosal wrent just distal to UES. - Z- line regular, 39 cm from the incisors. - 3 cm hiatal hernia. - No gross lesions in the stomach. - No gross lesions in the duodenal bulb, in the first portion of the duodenum and in the second portion of the duodenum. - Flat but otherwise normal major papilla.  EUS Impression:  - There was no sign of significant pathology in the pancreatic head, genu of the pancreas, pancreatic body and pancreatic tail. - There was no sign of significant pathology in the common bile duct and in the common hepatic duct. - No malignant- appearing lymph nodes were visualized in the celiac region ( level 20) , peripancreatic region and porta hepatis region.  EGD 12/30/2018 (for dysphagia, abnormal esophagram, unexplained chest pain) - No endoscopic esophageal abnormality to explain patient' s  dysphagia. Esophagus dilated.  - Small hiatal hernia.  - Normal duodenal bulb and second portion of the duodenum.  - No specimens collected.  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   Bunion    Cataract    mild    Colon polyps    Dysrhythmia 07/2013   SVT   Enthesopathy of ankle and tarsus, unspecified    Esophageal reflux    Family history of breast cancer    Family history of lung cancer    Family history of malignant neoplasm of gastrointestinal tract    Family history of ovarian cancer    Hiatal hernia    Irritable bowel syndrome    Monoallelic mutation of CDKN2A gene    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HCC)    Other acquired calcaneus deformity    Personal history of colonic polyps 2002   hyperplastic   Personal history of malignant melanoma    Personal history of radiation therapy    Personal history of squamous cell carcinoma of skin    Plantar fascial fibromatosis    Rotator cuff syndrome of shoulder and allied disorders    Skin cancer    Syncope     Past Surgical History:  Procedure Laterality Date   ABLATION OF DYSRHYTHMIC FOCUS  07/31/2013  DR WADDELL   ARTHROPLASTY  2006   thumb/wrist    AUGMENTATION MAMMAPLASTY Bilateral    BRAVO PH STUDY  02/01/2011   Procedure: BRAVO PH STUDY;  Surgeon: Lamar JONETTA Aho, MD;  Location: [facility];  Service: Endoscopy;  Laterality: N/A;   BREAST BIOPSY Left 09/2020   2023   BREAST BIOPSY Right 06/20/2021   BREAST BIOPSY Right 05/22/2022   US  RT BREAST BX W LOC DEV 1ST LESION IMG BX SPEC US  GUIDE 05/22/2022 [facility]   BREAST EXCISIONAL BIOPSY Left 10/2020   BREAST LUMPECTOMY Right 07/2021   BREAST LUMPECTOMY WITH RADIOACTIVE SEED LOCALIZATION Left 11/03/2020   Procedure: LEFT BREAST LUMPECTOMY WITH RADIOACTIVE SEED LOCALIZATION;  Surgeon: Vanderbilt Ned, MD;   Location: [facility];  Service: General;  Laterality: Left;   BREAST LUMPECTOMY WITH RADIOACTIVE SEED LOCALIZATION Right 07/19/2021   Procedure: RIGHT BREAST LUMPECTOMY WITH RADIOACTIVE SEED LOCALIZATION;  Surgeon: Vanderbilt Ned, MD;  Location:  SURGERY CENTER;  Service: General;  Laterality: Right;   BUNIONECTOMY     CESAREAN SECTION     x3   COLONOSCOPY  07/16/2012   COLONOSCOPY WITH PROPOFOL  N/A 10/10/2022   Procedure: COLONOSCOPY WITH PROPOFOL ;  Surgeon: Wilhelmenia Aloha Raddle., MD;  Location: [facility];  Service: Gastroenterology;  Laterality: N/A;   ESOPHAGOGASTRODUODENOSCOPY  02/01/2011   ESOPHAGOGASTRODUODENOSCOPY (EGD) WITH PROPOFOL  N/A 12/12/2020   Procedure: ESOPHAGOGASTRODUODENOSCOPY (EGD) WITH PROPOFOL ;  Surgeon: Wilhelmenia Aloha Raddle., MD;  Location: [facility];  Service: Gastroenterology;  Laterality: N/A;   ESOPHAGOGASTRODUODENOSCOPY (EGD) WITH PROPOFOL  N/A 10/10/2022   Procedure: ESOPHAGOGASTRODUODENOSCOPY (EGD) WITH PROPOFOL ;  Surgeon: Wilhelmenia Aloha Raddle., MD;  Location: [facility];  Service: Gastroenterology;  Laterality: N/A;   EUS N/A 12/12/2020   Procedure: UPPER ENDOSCOPIC ULTRASOUND (EUS) RADIAL;  Surgeon: Wilhelmenia Aloha Raddle., MD;  Location: [facility];  Service: Gastroenterology;  Laterality: N/A;   EUS N/A 10/10/2022   Procedure: UPPER ENDOSCOPIC ULTRASOUND (EUS) RADIAL;  Surgeon: Wilhelmenia Aloha Raddle., MD;  Location: [facility];  Service: Gastroenterology;  Laterality: N/A;   PARTIAL HYSTERECTOMY     Still has ovaries   PLACEMENT OF BREAST IMPLANTS     POLYPECTOMY     POLYPECTOMY  10/10/2022   Procedure: POLYPECTOMY;  Surgeon: Mansouraty, Aloha Raddle., MD;  Location: [facility];  Service: Gastroenterology;;   HARLEY DILATION N/A 12/12/2020   Procedure: HARLEY DILATION;  Surgeon: Wilhelmenia Aloha Raddle., MD;  Location: [facility];  Service: Gastroenterology;  Laterality: N/A;   SHOULDER SURGERY     SUPRAVENTRICULAR  TACHYCARDIA ABLATION N/A 07/31/2013   Procedure: SUPRAVENTRICULAR TACHYCARDIA ABLATION;  Surgeon: Danelle LELON Waddell, MD;  Location: [facility];  Service: Cardiovascular;  Laterality: N/A;    Current Outpatient Medications  Medication Sig Dispense Refill   alendronate (FOSAMAX) 70 MG tablet Take 70 mg by mouth once a week.     Cholecalciferol (VITAMIN D-3) 125 MCG (5000 UT) TABS Take 5,000 Units by mouth daily.     cloNIDine (CATAPRES) 0.1 MG tablet Take 0.2 mg by mouth at bedtime.     [employer] SUNSCREEN SPF 30 EX [employer] progest cream once daily     gabapentin (NEURONTIN) 300 MG capsule Take 600 mg by mouth at bedtime.     guanFACINE (TENEX) 1 MG tablet 1 mg every day by oral route.     MAGNESIUM  PO Take 1 tablet by mouth daily. magnesium  l-threonate     mirtazapine (REMERON) 15 MG tablet Take 15 mg by mouth at bedtime.     Multiple Vitamin (MULTIVITAMIN  WITH MINERALS) TABS tablet Take 1 tablet by mouth daily.     pantoprazole  (PROTONIX ) 40 MG tablet Take 1 tablet (40 mg total) by mouth 2 (two) times daily. 60 tablet 0   valACYclovir (VALTREX) 500 MG tablet Take 500 mg by mouth 2 (two) times daily as needed (Breakouts).  0   zolpidem  (AMBIEN  CR) 12.5 MG CR tablet Take 12.5 mg by mouth at bedtime.     No current facility-administered medications for this visit.    Allergies as of 04/23/2024 - Review Complete 04/23/2024  Allergen Reaction Noted   Citalopram Nausea Only 10/04/2022   Paroxetine  10/04/2022   Venlafaxine Diarrhea 10/04/2022    Family History  Problem Relation Age of Onset   Colon cancer Father 34   Heart disease Mother    Breast cancer Maternal Aunt        dx 104s   Lung cancer Maternal Aunt        dx 16s, smoker   Ovarian cancer Cousin        dx 48s, maternal first cousin   Lung cancer Maternal Grandfather        dx 33s, smoker   Colon cancer Maternal Uncle        dx 56s   Lung cancer Other        MGF's brother   Colon cancer Nephew 50   Dementia Maternal Aunt     Breast cancer Cousin        dx 54s, maternal first cousin   Ovarian cancer Cousin 26       dysgerminoma, maternal first cousin   Lung cancer Other        MGF's brother   Colon polyps Neg Hx    Esophageal cancer Neg Hx    Rectal cancer Neg Hx    Stomach cancer Neg Hx     Social History[1]   Review of Systems:    Constitutional: No weight loss, fever, chills Cardiovascular: No chest pain Respiratory: No SOB Gastrointestinal: See HPI and otherwise negative    Physical Exam:  Vital signs: BP 92/68 (BP Location: Left Arm, Patient Position: Sitting, Cuff Size: Normal)   Pulse 62   Ht 5' 3.5 (1.613 m)   Wt 120 lb (54.4 kg)   BMI 20.92 kg/m   Wt Readings from Last 3 Encounters:  04/23/24 120 lb (54.4 kg)  02/19/23 115 lb (52.2 kg)  12/14/22 111 lb 9.6 oz (50.6 kg)     Constitutional: Pleasant, well-appearing female in NAD, alert and cooperative Head:  Normocephalic and atraumatic.  Respiratory: Respirations even and unlabored. Lungs clear to auscultation bilaterally.  No wheezes, crackles, or rhonchi.  Cardiovascular:  Regular rate and rhythm. No murmurs. No peripheral edema. Gastrointestinal:  Soft, nondistended, nontender. No rebound or guarding. Normal bowel sounds. No appreciable masses or hepatomegaly. Rectal:  Not performed.  Neurologic:  Alert and oriented x4;  grossly normal neurologically.  Skin:   Dry and intact without significant lesions or rashes. Psychiatric: Oriented to person, place and time. Demonstrates good judgement and reason without abnormal affect or behaviors.   Assessment/Plan:   Assessment & Plan Gastroesophageal reflux disease Symptoms well-controlled with infrequent pantoprazole  use; no new or progressive symptoms.  - Continue pantoprazole  40 mg daily as needed - Discussed that refills can be managed by primary care if preferred. - Advised to report new or progressive symptoms such as increased frequency of symptoms, worsening dysphagia, or  odynophagia.  Esophageal dysphagia Intermittent, non-progressive dysphagia with certain foods, particularly  rice and meats which are avoided.  Symptoms have been ongoing for years, she has previously had her esophagus dilated but did not perceive much benefit from this in the past and more recently has declined repeat esophageal dilation.  Last evaluation 2024 and will be due for repeat upper EUS in August of this year.  In absence of worsening symptoms, further workup deferred at this time.  Advised patient to monitor for worsening symptoms.    - Deferred esophageal dilation per preference. - Advised to report worsening or progressive symptoms. - Planned evaluation of esophagus at upcoming endoscopic ultrasound unless symptoms worsen.  Screening for pancreatic neoplasm Annual surveillance with alternating MRI and endoscopic ultrasound.  Due for next upper EUS August 2026.  Recall placed.  - Confirmed next endoscopic ultrasound due August 2026.  Family history of colon cancer Personal history of adenomatous colon polyps Due for repeat colonoscopy 09/2025.   Camie Furbish, PA-C [facility] 04/23/2024, 11:11 AM  Patient Care Team: Caitlin Trula SQUIBB, MD as PCP - General (Internal Medicine) Dann Candyce RAMAN, MD as PCP - Cardiology (Cardiology) Odean Potts, MD as Consulting Physician (Hematology and Oncology) Aneita Gwendlyn DASEN, MD (Inactive) as Consulting Physician (Gastroenterology) Dewey Rush, MD as Consulting Physician (Radiation Oncology) Vanderbilt Ned, MD as Consulting Physician (General Surgery)       [1]  Social History Tobacco Use   Smoking status: Former    Current packs/day: 0.00    Types: Cigarettes    Quit date: 03/19/1978    Years since quitting: 46.1   Smokeless tobacco: Never  Vaping Use   Vaping status: Never Used  Substance Use Topics   Alcohol use: Not Currently    Comment: rarely- twice a yr if that    Drug use: No   "

## 2024-04-23 NOTE — Progress Notes (Signed)
 Attending Physician's Attestation   I have reviewed the chart.   I agree with the Advanced Practitioner's note, impression, and recommendations with any updates as below.    Corliss Parish, MD [facility] Advanced Endoscopy Office # [phone]

## 2024-04-23 NOTE — Patient Instructions (Signed)
 _______________________________________________________  If your blood pressure at your visit was 140/90 or greater, please contact your primary care physician to follow up on this.  _______________________________________________________  If you are age 71 or older, your body mass index should be between 23-30. Your Body mass index is 20.92 kg/m. If this is out of the aforementioned range listed, please consider follow up with your Primary Care Provider.  If you are age 78 or younger, your body mass index should be between 19-25. Your Body mass index is 20.92 kg/m. If this is out of the aformentioned range listed, please consider follow up with your Primary Care Provider.   ________________________________________________________  The [facility] providers would like to encourage you to use MYCHART to communicate with providers for non-urgent requests or questions.  Due to long hold times on the telephone, sending your provider a message by [facility] may be a faster and more efficient way to get a response.  Please allow 48 business hours for a response.  Please remember that this is for non-urgent requests.  _______________________________________________________  [facility] is using a team-based approach to care.  Your team is made up of your doctor and two to three APPS. Our APPS (Nurse Practitioners and Physician Assistants) work with your physician to ensure care continuity for you. They are fully qualified to address your health concerns and develop a treatment plan. They communicate directly with your gastroenterologist to care for you. Seeing the Advanced Practice Practitioners on your physician's team can help you by facilitating care more promptly, often allowing for earlier appointments, access to diagnostic testing, procedures, and other specialty referrals.   FOLLOW UP AS NEEDED

## 2024-06-10 ENCOUNTER — Ambulatory Visit: Admitting: Pulmonary Disease

## 2024-06-12 ENCOUNTER — Ambulatory Visit: Admitting: Pulmonary Disease
